# Patient Record
Sex: Male | Born: 1945
Health system: Southern US, Community
[De-identification: ages and names within clinical notes are randomized; demographics above are authoritative.]

## PROBLEM LIST (undated history)

## (undated) DIAGNOSIS — K429 Umbilical hernia without obstruction or gangrene: Secondary | ICD-10-CM

## (undated) DIAGNOSIS — M1990 Unspecified osteoarthritis, unspecified site: Secondary | ICD-10-CM

## (undated) DIAGNOSIS — N400 Enlarged prostate without lower urinary tract symptoms: Secondary | ICD-10-CM

## (undated) DIAGNOSIS — C801 Malignant (primary) neoplasm, unspecified: Secondary | ICD-10-CM

## (undated) DIAGNOSIS — N200 Calculus of kidney: Secondary | ICD-10-CM

## (undated) DIAGNOSIS — K5090 Crohn's disease, unspecified, without complications: Secondary | ICD-10-CM

## (undated) DIAGNOSIS — Z87442 Personal history of urinary calculi: Secondary | ICD-10-CM

## (undated) DIAGNOSIS — F32A Depression, unspecified: Secondary | ICD-10-CM

## (undated) DIAGNOSIS — I10 Essential (primary) hypertension: Secondary | ICD-10-CM

## (undated) DIAGNOSIS — E119 Type 2 diabetes mellitus without complications: Secondary | ICD-10-CM

## (undated) HISTORY — DX: Crohn's disease, unspecified, without complications: K50.90

## (undated) HISTORY — PX: HERNIA REPAIR: SHX51

## (undated) HISTORY — PX: CHOLECYSTECTOMY: SHX55

## (undated) HISTORY — DX: Unspecified osteoarthritis, unspecified site: M19.90

## (undated) HISTORY — PX: COLON SURGERY: SHX602

## (undated) HISTORY — DX: Depression, unspecified: F32.A

## (undated) HISTORY — DX: Benign prostatic hyperplasia without lower urinary tract symptoms: N40.0

## (undated) HISTORY — PX: OTHER SURGICAL HISTORY: SHX169

## (undated) HISTORY — DX: Essential (primary) hypertension: I10

## (undated) HISTORY — DX: Type 2 diabetes mellitus without complications: E11.9

## (undated) HISTORY — DX: Calculus of kidney: N20.0

## (undated) HISTORY — PX: CHOLECYSTECTOMY, LAPAROSCOPIC: SHX56

---

## 2006-11-08 ENCOUNTER — Inpatient Hospital Stay: Payer: Self-pay | Admitting: Vascular Surgery

## 2006-11-08 ENCOUNTER — Other Ambulatory Visit: Payer: Self-pay

## 2012-10-14 ENCOUNTER — Ambulatory Visit: Payer: Self-pay | Admitting: Nurse Practitioner

## 2013-07-06 ENCOUNTER — Ambulatory Visit: Payer: Self-pay | Admitting: Urology

## 2014-08-23 ENCOUNTER — Ambulatory Visit: Payer: Self-pay | Admitting: Urology

## 2014-08-30 ENCOUNTER — Ambulatory Visit: Payer: Self-pay | Admitting: Urology

## 2014-09-12 ENCOUNTER — Ambulatory Visit: Payer: Self-pay

## 2014-09-26 ENCOUNTER — Ambulatory Visit
Admit: 2014-09-26 | Disposition: A | Discharge status: Home / Self Care | Payer: Self-pay | Attending: Urology | Admitting: Urology

## 2014-10-09 ENCOUNTER — Ambulatory Visit: Payer: Self-pay

## 2014-10-11 ENCOUNTER — Ambulatory Visit
Admit: 2014-10-11 | Disposition: A | Discharge status: Home / Self Care | Payer: Self-pay | Attending: Urology | Admitting: Urology

## 2014-10-11 LAB — BASIC METABOLIC PANEL
Anion Gap: 8 (ref 7–16)
BUN: 21 mg/dL — ABNORMAL HIGH
CALCIUM: 8.9 mg/dL
CO2: 25 mmol/L
CREATININE: 0.94 mg/dL
Chloride: 106 mmol/L
EGFR (African American): >60
EGFR (Non-African Amer.): >60
GLUCOSE: 108 mg/dL — AB
Potassium: 4.2 mmol/L
Sodium: 139 mmol/L

## 2014-10-11 LAB — CBC
HCT: 43 % (ref 40.0–52.0)
HGB: 14.2 g/dL (ref 13.0–18.0)
MCH: 28.8 pg (ref 26.0–34.0)
MCHC: 33 g/dL (ref 32.0–36.0)
MCV: 87 fL (ref 80–100)
PLATELETS: 206 10*3/uL (ref 150–440)
RBC: 4.94 10*6/uL (ref 4.40–5.90)
RDW: 13.9 % (ref 11.5–14.5)
WBC: 8.2 10*3/uL (ref 3.8–10.6)

## 2014-10-13 LAB — URINE CULTURE

## 2014-10-15 NOTE — Op Note (Signed)
PATIENT NAME:  Isaiah Walker, Isaiah Walker MR#:  [id number] DATE OF BIRTH:  02-16-1946  DATE OF PROCEDURE:  08/30/2014  PREOPERATIVE DIAGNOSIS: Left kidney stones.   POSTOPERATIVE DIAGNOSIS: Left kidney stones.   PROCEDURE PERFORMED: Left percutaneous nephrolithotomy.   ANESTHESIA: General anesthesia.   ATTENDING SURGEON: Sherlynn Stalls, MD   ESTIMATED BLOOD LOSS: 150 mL.   DRAINS: A 16 French Foley catheter per urethra, 6 x 26 French double-J ureteral stent, an 45 French Council tip catheter nephrostomy tube.   SPECIMENS: Stone fragment.   COMPLICATIONS: None.   INDICATION: This is a 69 year old male with multiple large left kidney stones located in the lower pole as well as a large upper stone in the upper pole. Given his stone burden, he was counseled to undergo left percutaneous nephrolithotomy. He presented earlier today to interventional radiology, at which time lower pole posterior access was obtained and a nephroureteral catheter was placed down into the bladder. The patient presents for the definitive stone procedure this afternoon. The risks and benefits of the procedure were explained in detail. The patient agreed to proceed as planned.   PROCEDURE: The patient was correctly identified in the preoperative holding area and after consent was confirmed, he was brought to that suite and presented in supine position. At this time universal timeout protocol was performed. All team members were identified. Venodyne boots were placed and the patient was administered IV Ancef in the perioperative period. Of note, he had already received ciprofloxacin at the time of his percutaneous access in interventional radiology. He was then placed under general anesthesia while the remaining supine on the stretcher and a Foley catheter was placed sterilely at this time. The patient was then repositioned on the Operating Room table in the prone position, with care taken to pad all pressure points. He was strapped to  the table using 3 security straps. He was then prepped and draped in the standard surgical fashion. At this point in time, a 0.038 Sensor wire was placed through the no ureteral catheter down to the level of the bladder, confirmed under fluoroscopic guidance. The catheter was then removed, leaving only the wire in place. A safety wire introducer was then advanced to the level of the UPJ. I attempted to place a Super Stiff wire through this catheter down to the bladder. This did not go easily, but I was able to get a glide wire down to the bladder without difficulty. With 2 wires now in place, using a 5 [place] open-ended ureteral catheter down to the level of the mid ureter, the glide wire was exchanged for a Super Stiff wire, making the Sensor wire the safety wire and the Super Stiff wire the working wire. At this point in time, a larger incision was made in the flank, approximately 1.5 cm long. A fascial incising needle was used over the Super Stiff wire to incise the fascia and allow for easier access and dilation. A NephroMax balloon was then advanced just within the kidney under fluoroscopic guidance and the balloon was inflated up to 18 cm of water. The balloon inflated nicely without any weak points or wasted in the balloon. This remained in place for several minutes while the nephroscope and Cyber Wand were assembled. At this point, the 60 [place] access sheath was advanced over the balloon just within the lower pole calyx and the balloon was deflated. There is not much significant bleeding at this point and the balloon was removed. The nephroscope was then brought in and  almost immediately, the lower pole stones were encountered. There were numerous, very large stones, which could be seen directly visually, but also fluoroscopically. The Cyber Wand was then brought in and each of these stones were fragmented and suctioned out of the lower pole of the kidney. This went quite quickly and the stones broke quite  nicely. There was a few residual stones in another calyx, which were identified and these were plucked out using piranha graspers. The lower pole, at this point, was both visually and fluoroscopically clear of any significant stone fragment. I then attempted to advance the nephroscope up to the upper pole, but given the angulation, I was unable to work my scope safely to access the upper pole from this particular renal access. A flexible cystoscope was then brought in through the access sheath and I was able to navigate up to the upper pole where a very large single stone was identified. A 365 micron laser fiber was brought in and using the settings of 0.2 joules and 50 Hz, this stone was fragmented and dusted into very small pieces. Fluoroscopically at the end of the fragmentation, there was no residual large stone, but a haze of  dust could be appreciated. Unfortunately, visualization became somewhat poor at this point in time from some mucosal bleeding and I was unable to efficiently or effectively basket out any of the very small fragments in the upper pole. Several times throughout the case, the access sheath did back out of the patient's kidney and had to be re-advanced over the wire after reintroducing and dilating the tract, which is somewhat cumbersome. At this point, the stone fragmentation was deemed adequate. Given the small fragments in the upper pole and the stone dust, I did go ahead and elect to place an indwelling double-J stent to help facilitate the passage of these fragments. A 6 x 26 French double-J ureteral stent was advanced over the wire down to the level of the bladder under fluoroscopic guidance. The wire was partially withdrawn and the coil was noted to cross the midline and definitely appeared to be within the bladder. The wire was then fully withdrawn and unfortunately, at this point, the coil of the stent did not curl optimally within the renal pelvis. I then used the piranha forceps  and the nephroscope reposition the upper coil of the stent within the renal pelvis satisfactorily. Finally, the wire was reintroduced back into the renal pelvis. A nephroscope and an 41 [place] Council tip catheter was advanced over the wire into the renal pelvis and the balloon was then inflated with 3 mL to anchor the catheter, and the sheath was backed out over of the nephrostomy tube and removed. An antegrade nephrostogram was then performed through the Foley nephrostomy, which revealed minimal extravasation and good flow of contrast down the ureter. The safety wire was carefully removed with care taken not dislodge the stent. The nephrostomy tube was secured in place using silk suture x2. The patient was then cleaned and dried and a dressing of 4 x 4's, ABDs and foam tape was applied. At this point, the patient was repositioned in the supine position and back on the stretcher, reversed from anesthesia, and extubated. He was taken to the PACU in stable condition. There were no complications in this case.   PLAN: I will admit the patient overnight for observation and most likely remove his nephrostomy tube in the morning, as well as his Foley catheter if he is doing well.   He  will need to follow up in a few weeks with a KUB to see how much of the stone fragments he has been able to pass with aid of the ureteral stents. He may need a repeat noncontrast CT scan to assess his residual upper pole stone burden. I discussed with the family that based on these results, we may and elect to remove his stent versus return to the OR for basket extraction ureteroscopically at the upper pole fragments. They were all agreeable to this plan.   ____________________________ Sherlynn Stalls, MD ajb:ap D: 08/30/2014 19:51:34 ET T: 08/31/2014 08:14:37 ET JOB#: [id number]  cc: Sherlynn Stalls, MD, <Dictator> Sherlynn Stalls MD ELECTRONICALLY SIGNED 09/26/2014 17:51

## 2014-10-15 NOTE — Discharge Summary (Signed)
Dates of Admission and Diagnosis:  Date of Admission 30-Aug-2014   Date of Discharge 31-Aug-2014   Admitting Diagnosis Left kidney stones   Final Diagnosis Left kidney stones   Discharge Diagnosis 1 as above    Chief Complaint/History of Present Illness see admission H&P   Allergies:  IVP Dye: Rash    Routine Chem:  17-Mar-16 05:23   Glucose, Serum  135 (65-99 NOTE: New Reference Range  08/08/14)  BUN  23 (6-20 NOTE: New Reference Range  08/08/14)  Creatinine (comp) 0.98 (0.61-1.24 NOTE: New Reference Range  08/08/14)  Sodium, Serum 140 (135-145 NOTE: New Reference Range  08/08/14)  Potassium, Serum 4.4 (3.5-5.1 NOTE: New Reference Range  08/08/14)  Chloride, Serum 107 (101-111 NOTE: New Reference Range  08/08/14)  CO2, Serum 25 (22-32 NOTE: New Reference Range  08/08/14)  Calcium (Total), Serum  8.4 (8.9-10.3 NOTE: New Reference Range  08/08/14)  Anion Gap 8  eGFR (African American) >60  eGFR (Non-African American) >60 (eGFR values <82m/min/1.73 m2 may be an indication of chronic kidney disease (CKD). Calculated eGFR is useful in patients with stable renal function. The eGFR calculation will not be reliable in acutely ill patients when serum creatinine is changing rapidly. It is not useful in patients on dialysis. The eGFR calculation may not be applicable to patients at the low and high extremes of body sizes, pregnant women, and vegetarians.)  Routine Hem:  17-Mar-16 05:23   WBC (CBC)  19.5  RBC (CBC) 4.69  Hemoglobin (CBC) 13.3  Hematocrit (CBC) 41.2  Platelet Count (CBC) 181  MCV 88  MCH 28.3  MCHC 32.3  RDW 14.3  Neutrophil % 89.3  Lymphocyte % 4.2  Monocyte % 6.5  Eosinophil % 0.0  Basophil % 0.0  Neutrophil #  17.4  Lymphocyte #  0.8  Monocyte #  1.3  Eosinophil # 0.0  Basophil # 0.0 (Result(s) reported on 31 Aug 2014 at 06:27AM.)   Pertinent Past History:  Pertinent Past History See admission H&P   Hospital Course:  Hospital  Course Patient was admitted for observation following left PNCL.  He tolerated the procedure well and was taken to the PACU then floor in stable condition.  Overnight, his pain was well controlled, labs/ vitals remains stable.  POD1 his nephrostomy tube was clamped and ultimately his Foley and nephrostomy tube was removed.  His diet was advacned and he was able to tolerate a regular diet.  He was discharged home in good condition.   Condition on Discharge Good   Code Status:  Code Status Full Code   DISCHARGE INSTRUCTIONS HOME MEDS:  Medication Reconciliation: Patient's Home Medications at Discharge:     Medication Instructions  meloxicam 15 mg oral tablet  1 tab(s) orally once a day   allopurinol 300 mg oral tablet  1 tab(s) orally once a day   metoprolol tartrate 50 mg oral tablet  1 tab(s) orally 2 times a day   trazodone 150 mg oral tablet  1 tab(s) orally once a day (at bedtime)   acetaminophen-oxycodone 325 mg-5 mg oral tablet  1 tab(s) orally every 6 hours, As Needed - for Pain   flomax 0.4 mg oral capsule  1 cap(s) orally once a day   doculase 100 mg oral capsule  1 cap(s) orally     PRESCRIPTIONS: PRINTED AND PLACED ON CHART   Physician's Instructions:  Home Health? No   Treatments None    Dressing Care A dry bandage has been applied to your wound.  Keep this bandage clean and dry.  Remove dressing tomorrow.  May shower.  Remove dressing in 2-3 days.  May shower.  It is NORMAL to leak urine from  the back for several days.  Please change the dressing as needed.    Home Oxygen? No   Diet Regular    Dietary Supplements None   Diet Consistency Regular Consistency    Activity Limitations As tolerated    Return to Work as tolerated    Time frame for Follow Up Appointment 2-4 weeks  3 weeks with Dr. Erlene Quan, KUB prior        Hollice Espy J(Attending Physician): [facility], [address] [place], [place], [place] [address], [place]  [phone]  Electronic Signatures: Sherlynn Stalls (MD)  (Signed 17-Mar-16 11:03)  Authored: ADMISSION DATE AND DIAGNOSIS, CHIEF COMPLAINT/HPI, Allergies, PERTINENT LABS, PERTINENT PAST HISTORY, HOSPITAL COURSE, DISCHARGE INSTRUCTIONS HOME MEDS, PATIENT INSTRUCTIONS, Follow Up Physician   Last Updated: 17-Mar-16 11:03 by Sherlynn Stalls (MD)

## 2014-10-16 ENCOUNTER — Ambulatory Visit: Payer: Medicare Other | Admitting: Certified Registered"

## 2014-10-16 ENCOUNTER — Encounter: Payer: Self-pay | Admitting: *Deleted

## 2014-10-16 ENCOUNTER — Encounter
Admission: RE | Disposition: A | Discharge status: Home / Self Care | Payer: Self-pay | Source: Ambulatory Visit | Attending: Urology

## 2014-10-16 ENCOUNTER — Ambulatory Visit
Admission: RE | Admit: 2014-10-16 | Discharge: 2014-10-16 | Disposition: A | Discharge status: Home / Self Care | Payer: Medicare Other | Source: Ambulatory Visit | Attending: Urology | Admitting: Urology

## 2014-10-16 DIAGNOSIS — N400 Enlarged prostate without lower urinary tract symptoms: Secondary | ICD-10-CM | POA: Insufficient documentation

## 2014-10-16 DIAGNOSIS — N3001 Acute cystitis with hematuria: Secondary | ICD-10-CM | POA: Insufficient documentation

## 2014-10-16 DIAGNOSIS — R300 Dysuria: Secondary | ICD-10-CM | POA: Insufficient documentation

## 2014-10-16 DIAGNOSIS — N200 Calculus of kidney: Secondary | ICD-10-CM

## 2014-10-16 DIAGNOSIS — Z91041 Radiographic dye allergy status: Secondary | ICD-10-CM | POA: Diagnosis not present

## 2014-10-16 DIAGNOSIS — Z87442 Personal history of urinary calculi: Secondary | ICD-10-CM | POA: Diagnosis not present

## 2014-10-16 DIAGNOSIS — I10 Essential (primary) hypertension: Secondary | ICD-10-CM | POA: Diagnosis not present

## 2014-10-16 DIAGNOSIS — N202 Calculus of kidney with calculus of ureter: Secondary | ICD-10-CM | POA: Insufficient documentation

## 2014-10-16 DIAGNOSIS — Z801 Family history of malignant neoplasm of trachea, bronchus and lung: Secondary | ICD-10-CM | POA: Diagnosis not present

## 2014-10-16 DIAGNOSIS — Z833 Family history of diabetes mellitus: Secondary | ICD-10-CM | POA: Insufficient documentation

## 2014-10-16 DIAGNOSIS — Z79899 Other long term (current) drug therapy: Secondary | ICD-10-CM | POA: Diagnosis not present

## 2014-10-16 DIAGNOSIS — Z6836 Body mass index (BMI) 36.0-36.9, adult: Secondary | ICD-10-CM | POA: Diagnosis not present

## 2014-10-16 DIAGNOSIS — Z8249 Family history of ischemic heart disease and other diseases of the circulatory system: Secondary | ICD-10-CM | POA: Insufficient documentation

## 2014-10-16 DIAGNOSIS — E669 Obesity, unspecified: Secondary | ICD-10-CM | POA: Diagnosis not present

## 2014-10-16 DIAGNOSIS — E785 Hyperlipidemia, unspecified: Secondary | ICD-10-CM | POA: Diagnosis not present

## 2014-10-16 HISTORY — PX: CYSTOSCOPY/URETEROSCOPY/HOLMIUM LASER/STENT PLACEMENT: SHX6546

## 2014-10-16 SURGERY — CYSTOSCOPY/URETEROSCOPY/HOLMIUM LASER/STENT PLACEMENT
Anesthesia: General | Laterality: Left | Wound class: Clean Contaminated

## 2014-10-16 MED ORDER — KETOROLAC TROMETHAMINE 60 MG/2ML IM SOLN
INTRAMUSCULAR | Status: AC
  Administered 2014-10-16: 15 mg via INTRAVENOUS
  Filled 2014-10-16: qty 2

## 2014-10-16 MED ORDER — FENTANYL CITRATE (PF) 100 MCG/2ML IJ SOLN
25.0000 ug | INTRAMUSCULAR | Status: DC | PRN
  Administered 2014-10-16 (×2): 25 ug via INTRAVENOUS

## 2014-10-16 MED ORDER — FENTANYL CITRATE (PF) 100 MCG/2ML IJ SOLN
INTRAMUSCULAR | Status: DC | PRN
  Administered 2014-10-16 (×2): 50 ug via INTRAVENOUS

## 2014-10-16 MED ORDER — ROCURONIUM BROMIDE 100 MG/10ML IV SOLN
INTRAVENOUS | Status: DC | PRN
  Administered 2014-10-16: 30 mg via INTRAVENOUS

## 2014-10-16 MED ORDER — FENTANYL CITRATE (PF) 100 MCG/2ML IJ SOLN
INTRAMUSCULAR | Status: AC
  Filled 2014-10-16: qty 2

## 2014-10-16 MED ORDER — FAMOTIDINE 20 MG PO TABS
ORAL_TABLET | ORAL | Status: AC
  Filled 2014-10-16: qty 1

## 2014-10-16 MED ORDER — CEFAZOLIN SODIUM 1-5 GM-% IV SOLN
1.0000 g | Freq: Once | INTRAVENOUS | Status: AC
  Administered 2014-10-16: 1 g via INTRAVENOUS

## 2014-10-16 MED ORDER — TAMSULOSIN HCL 0.4 MG PO CAPS: 0.4000 mg | ORAL_CAPSULE | Freq: Every day | ORAL | Status: DC

## 2014-10-16 MED ORDER — PROPOFOL 10 MG/ML IV BOLUS
INTRAVENOUS | Status: DC | PRN
  Administered 2014-10-16: 170 mg via INTRAVENOUS

## 2014-10-16 MED ORDER — LACTATED RINGERS IV SOLN
INTRAVENOUS | Status: DC
Start: 2014-10-16 — End: 2014-10-16
  Administered 2014-10-16: 15:00:00 via INTRAVENOUS

## 2014-10-16 MED ORDER — PHENYLEPHRINE HCL 10 MG/ML IJ SOLN
INTRAMUSCULAR | Status: DC | PRN
  Administered 2014-10-16: 100 ug via INTRAVENOUS

## 2014-10-16 MED ORDER — ONDANSETRON HCL 4 MG/2ML IJ SOLN: 4.0000 mg | Freq: Once | INTRAMUSCULAR | Status: DC | PRN

## 2014-10-16 MED ORDER — IOTHALAMATE MEGLUMINE 43 % IV SOLN
INTRAVENOUS | Status: DC | PRN
  Administered 2014-10-16: 15 mL via URETHRAL

## 2014-10-16 MED ORDER — MIDAZOLAM HCL 2 MG/2ML IJ SOLN
INTRAMUSCULAR | Status: DC | PRN
  Administered 2014-10-16: 2 mg via INTRAVENOUS

## 2014-10-16 MED ORDER — EPHEDRINE SULFATE 50 MG/ML IJ SOLN
INTRAMUSCULAR | Status: DC | PRN
  Administered 2014-10-16 (×2): 5 mg via INTRAVENOUS

## 2014-10-16 MED ORDER — ONDANSETRON HCL 4 MG/2ML IJ SOLN
INTRAMUSCULAR | Status: DC | PRN
  Administered 2014-10-16: 4 mg via INTRAVENOUS

## 2014-10-16 MED ORDER — GLYCOPYRROLATE 0.2 MG/ML IJ SOLN
INTRAMUSCULAR | Status: DC | PRN
  Administered 2014-10-16: 0.6 mg via INTRAVENOUS

## 2014-10-16 MED ORDER — OXYCODONE-ACETAMINOPHEN 5-325 MG PO TABS: 1.0000 | ORAL_TABLET | Freq: Four times a day (QID) | ORAL | Status: DC | PRN

## 2014-10-16 MED ORDER — LACTATED RINGERS IV SOLN
INTRAVENOUS | Status: DC | PRN
  Administered 2014-10-16: 16:00:00 via INTRAVENOUS

## 2014-10-16 MED ORDER — KETOROLAC TROMETHAMINE 15 MG/ML IJ SOLN
15.0000 mg | Freq: Once | INTRAMUSCULAR | Status: DC
  Filled 2014-10-16: qty 1

## 2014-10-16 MED ORDER — LIDOCAINE HCL (CARDIAC) 20 MG/ML IV SOLN
INTRAVENOUS | Status: DC | PRN
  Administered 2014-10-16: 100 mg via INTRAVENOUS

## 2014-10-16 MED ORDER — NEOSTIGMINE METHYLSULFATE 10 MG/10ML IV SOLN
INTRAVENOUS | Status: DC | PRN
  Administered 2014-10-16: 4 mg via INTRAVENOUS

## 2014-10-16 MED ORDER — SODIUM CHLORIDE 0.9 % IR SOLN
Status: DC | PRN
  Administered 2014-10-16: 1500 mL

## 2014-10-16 MED ORDER — OXYBUTYNIN CHLORIDE 5 MG PO TABS: 5.0000 mg | ORAL_TABLET | Freq: Three times a day (TID) | ORAL | Status: DC | PRN

## 2014-10-16 MED ORDER — FAMOTIDINE 20 MG PO TABS
20.0000 mg | ORAL_TABLET | Freq: Once | ORAL | Status: AC
  Administered 2014-10-16: 20 mg via ORAL

## 2014-10-16 MED ORDER — CEFAZOLIN SODIUM 1-5 GM-% IV SOLN
INTRAVENOUS | Status: AC
  Filled 2014-10-16: qty 50

## 2014-10-16 SURGICAL SUPPLY — 25 items
ADAPTER SCOPE UROLOK II (MISCELLANEOUS) ×3 IMPLANT
ADHESIVE MASTISOL STRL (MISCELLANEOUS) IMPLANT
BAG DRAIN CYSTO-URO LG1000N (MISCELLANEOUS) ×3 IMPLANT
CATH URETL 5X70 OPEN END (CATHETERS) ×3 IMPLANT
CONRAY 43 FOR UROLOGY 50M (MISCELLANEOUS) ×3 IMPLANT
DRSG TEGADERM 2-3/8X2-3/4 SM (GAUZE/BANDAGES/DRESSINGS) IMPLANT
GLOVE BIO SURGEON STRL SZ 6.5 (GLOVE) ×2 IMPLANT
GLOVE BIO SURGEON STRL SZ7 (GLOVE) ×9 IMPLANT
GLOVE BIO SURGEONS STRL SZ 6.5 (GLOVE) ×1
GOWN STRL REUS W/ TWL LRG LVL3 (GOWN DISPOSABLE) ×2 IMPLANT
GOWN STRL REUS W/TWL LRG LVL3 (GOWN DISPOSABLE) ×4
GUIDEWIRE SUPER STIFF (WIRE) ×3 IMPLANT
JELLY LUB 2OZ STRL (MISCELLANEOUS) ×2
JELLY LUBE 2OZ STRL (MISCELLANEOUS) ×1 IMPLANT
PACK CYSTO AR (MISCELLANEOUS) ×3 IMPLANT
PREP PVP WINGED SPONGE (MISCELLANEOUS) ×3 IMPLANT
SENSORWIRE 0.038 NOT ANGLED (WIRE) ×3
SET CYSTO W/LG BORE CLAMP LF (SET/KITS/TRAYS/PACK) ×3 IMPLANT
SOL .9 NS 3000ML IRR  AL (IV SOLUTION) ×2
SOL .9 NS 3000ML IRR UROMATIC (IV SOLUTION) ×1 IMPLANT
STENT URET 6FRX24 CONTOUR (STENTS) IMPLANT
STENT URET 6FRX26 CONTOUR (STENTS) ×3 IMPLANT
SYRINGE IRR TOOMEY STRL 70CC (SYRINGE) ×3 IMPLANT
WATER STERILE IRR 1000ML POUR (IV SOLUTION) ×3 IMPLANT
WIRE SENSOR 0.038 NOT ANGLED (WIRE) ×1 IMPLANT

## 2014-10-16 NOTE — Anesthesia Procedure Notes (Signed)
Procedure Name: Intubation Date/Time: 10/16/2014 4:00 PM Performed by: Silvana Newness Pre-anesthesia Checklist: Patient identified, Emergency Drugs available, Suction available, Patient being monitored and Timeout performed Patient Re-evaluated:Patient Re-evaluated prior to inductionOxygen Delivery Method: Circle system utilized Preoxygenation: Pre-oxygenation with 100% oxygen Intubation Type: IV induction Ventilation: Mask ventilation without difficulty Laryngoscope Size: Mac and 4 Grade View: Grade II Tube type: Oral Tube size: 7.0 mm Number of attempts: 1 Airway Equipment and Method: Stylet Placement Confirmation: ETT inserted through vocal cords under direct vision,  positive ETCO2 and breath sounds checked- equal and bilateral Secured at: 20 cm Tube secured with: Tape Dental Injury: Teeth and Oropharynx as per pre-operative assessment

## 2014-10-16 NOTE — H&P (Signed)
Patient seen and examined.  H&P up to date.  Plan for Left ureteroscopy, laser lithotripsy, left ureteral stent exchange.  RRR CTAB  Aixa Corsello  3:18 PM 10/16/2014

## 2014-10-16 NOTE — Anesthesia Postprocedure Evaluation (Signed)
  Anesthesia Post-op Note  Patient: Isaiah Walker  Procedure(s) Performed: Procedure(s): CYSTOSCOPY/URETEROSCOPY/HOLMIUM LASER/STENT PLACEMENT (Left)  Anesthesia type:General  Patient location: PACU  Post pain: Pain level controlled  Post assessment: Post-op Vital signs reviewed, Patient's Cardiovascular Status Stable, Respiratory Function Stable, Patent Airway and No signs of Nausea or vomiting  Post vital signs: Reviewed and stable  Last Vitals:  Filed Vitals:   10/16/14 1730  BP: 137/79  Pulse: 87  Temp: 37.3 C  Resp: 21    Level of consciousness: awake, alert  and patient cooperative  Complications: No apparent anesthesia complications

## 2014-10-16 NOTE — Transfer of Care (Signed)
Immediate Anesthesia Transfer of Care Note  Patient: Isaiah Walker  Procedure(s) Performed: Procedure(s): CYSTOSCOPY/URETEROSCOPY/HOLMIUM LASER/STENT PLACEMENT (Left)  Patient Location: PACU  Anesthesia Type:General  Level of Consciousness: awake, alert , oriented and patient cooperative  Airway & Oxygen Therapy: Patient Spontanous Breathing and Patient connected to face mask oxygen  Post-op Assessment: Report given to RN, Post -op Vital signs reviewed and stable and Patient moving all extremities  Post vital signs: Reviewed and stable  Last Vitals:  Filed Vitals:   10/16/14 1730  BP: 137/79  Pulse: 87  Temp: 37.3 C  Resp: 21    Complications: No apparent anesthesia complications

## 2014-10-16 NOTE — Discharge Instructions (Signed)
You have a ureteral stent in place.  This is a tube that extends from your kidney to your bladder.  This may cause urinary bleeding, burning with urination, and urinary frequency.  Please call our office or present to the ED if you develop fevers >101 or pain which is not able to be controlled with oral pain medications.  You may be given either Flomax and/ or ditropan to help with bladder spasms and stent pain in addition to pain medications.   ° °[facility] °[address], [address] °[place], [place] [address] °[phone] °

## 2014-10-16 NOTE — Brief Op Note (Signed)
10/16/2014  5:32 PM  PATIENT:  Berline Chough  69 y.o. male  PRE-OPERATIVE DIAGNOSIS:  RENAL STONES  POST-OPERATIVE DIAGNOSIS:  Left renal/ ureteral stones  PROCEDURE:  Procedure(s): CYSTOSCOPY/URETEROSCOPY/HOLMIUM LASER/STENT PLACEMENT (Left)  SURGEON:  Surgeon(s) and Role:    * Hollice Espy, MD - Primary  ANESTHESIA:   general  EBL:   minimal  DRAINS: 6 x 26 Fr JJ ureteral stent (NO string)   SPECIMEN:  No Specimen  DISPOSITION OF SPECIMEN:  N/A  COUNTS:  YES  TOURNIQUET:  * No tourniquets in log *  PLAN OF CARE: Discharge to home after PACU  PATIENT DISPOSITION:  PACU - hemodynamically stable.   Delay start of Pharmacological VTE agent (>24hrs) due to surgical blood loss or risk of bleeding: not applicable

## 2014-10-16 NOTE — Anesthesia Preprocedure Evaluation (Addendum)
Anesthesia Evaluation  Patient identified by MRN, date of birth, ID band Patient awake    Reviewed: Allergy & Precautions, NPO status , Patient's Chart, lab work & pertinent test results  History of Anesthesia Complications Negative for: history of anesthetic complications  Airway Mallampati: II  TM Distance: >3 FB Neck ROM: Full    Dental  (+) Partial Lower, Partial Upper   Pulmonary neg pulmonary ROS,    Pulmonary exam normal       Cardiovascular Exercise Tolerance: Good hypertension, Pt. on home beta blockers     Neuro/Psych negative neurological ROS  negative psych ROS   GI/Hepatic negative GI ROS, Neg liver ROS,   Endo/Other  negative endocrine ROSHypothyroidism   Renal/GU Renal InsufficiencyRenal disease     Musculoskeletal  (+) Arthritis -, Osteoarthritis,    Abdominal Normal abdominal exam  (+)   Peds  Hematology negative hematology ROS (+)   Anesthesia Other Findings   Reproductive/Obstetrics                          Anesthesia Physical Anesthesia Plan  ASA: III  Anesthesia Plan: General   Post-op Pain Management:    Induction: Intravenous  Airway Management Planned: LMA and Oral ETT  Additional Equipment:   Intra-op Plan:   Post-operative Plan: Extubation in OR  Informed Consent: I have reviewed the patients History and Physical, chart, labs and discussed the procedure including the risks, benefits and alternatives for the proposed anesthesia with the patient or authorized representative who has indicated his/her understanding and acceptance.   Dental advisory given  Plan Discussed with: Surgeon and CRNA  Anesthesia Plan Comments:         Anesthesia Quick Evaluation

## 2014-10-19 NOTE — Op Note (Signed)
[name], BISS NO.:  [id number]  MEDICAL RECORD NO.:  [id number]  LOCATION:  [facility]                         FACILITY:  [facility]  PHYSICIAN:  Isaiah Espy, MD     DATE OF BIRTH:  February 27, 1946  DATE OF PROCEDURE:  10/16/2014 DATE OF DISCHARGE:  10/16/2014                              OPERATIVE REPORT   PREOPERATIVE DIAGNOSIS:  Left kidney and ureteral stones.  POSTOPERATIVE DIAGNOSIS:  Left kidney and ureteral stones.  PROCEDURE PERFORMED:  Left ureteroscopy, laser lithotripsy, left ureteral stent exchange, left retrograde pyelogram.  SURGEON:  Isaiah Espy, MD  ANESTHESIA:  General anesthesia.  EBL:  Minimal.  DRAINS:  6 x 20 6-French double-J ureteral stent.  COMPLICATIONS:  None.  SPECIMENS:  None.  INDICATION:  This is a 69 year old male with a very large stone burden, who previously underwent PCNL for treatment of the majority of his stone burden.  He did have a few small residual stones at the end of the case which were unable to be retrieved at the time of the procedure.  In anticipation of returning for ureteroscopy to clear him of residual stone, I did place a double-J ureteral stent at the end of the case.  He returns today for ureteroscopy after a followup CT scan shows an 8 mm stone in the left lower pole of the kidney as well as some stone debris along the distal aspect of the stent.  He was counseled on his various treatment options and has elected to proceed today as planned procedure.  DESCRIPTION OF PROCEDURE:  The patient was correctly identified in the preoperative holding area and informed consent was confirmed.  He was brought to the operating suite and placed on the table in the supine position.  At this time, universal time-out protocol was performed.  All team members were identified.  Venodyne boots were placed, and he was administered IV ampicillin and gentamicin in the perioperative period. He was then placed under  general anesthesia, repositioned lower on the bed in the dorsal lithotomy position, prepped and draped in standard surgical fashion.  At this point in time, a rigid cystoscope using a 61- French access sheath was advanced per urethra into the bladder.  The ureteral stent was seen emanating from the right ureteral orifice, which was mildly encrusted.  The distal coil of the stent was grasped using stent graspers and brought out to the level of the urethral meatus.  The stent was then cannulated using a Sensor wire up to the level of the kidney, and the stent was removed.  This was snapped in placed as a safety wire.  A long semi-rigid ureteroscope was then brought in and advanced through the UO without difficulty up to the level of the mid ureter, where a larger stone fragment was identified.  A 200 micron laser fiber was then brought in and used on settings of 0.8 joules and 10 Hz.  The stone was fragmented into a few larger pieces.  Each of these pieces were then basketed out using a 1.9-French Nitinol basket until the distal end and mid ureter were noted to be clear of any stone or stone  fragments.  There was a small amount of edema in the mid ureter where the stone had been located, but no mucosal injury or trauma.  I was not able to advance the scope past the distal ureter to assess the remainder of the ureter, although it appeared to be free of any stone burden both on CT scan and fluoroscopy.  A second wire was then introduced up to the level of the kidney using the rigid cystoscope, and a flexible ureteroscope was then advanced over this working wire up to the level of the kidney.  The kidney was then carefully inspected and the larger 8 mm stone was identified in the lower pole calix.  The kidney had healed remarkably well from his PCNL procedure, and there was intact urothelium noted in all calices.  This stone was then fragmented into very fine dust-like particles using dusting  stone settings of 0.2 joules and 50 Hz.  Each and every calix were then directly visualized and there was no significant residual stone burden noted.  The scope was backed to the level of the UPJ and a retrograde pyelogram was performed. This revealed a mildly distended renal pelvis without any obvious filling defects or extravasation.  Each and every calix was then directly visualized using the previously performed pyelogram as a road map.  The kidney at this point was noted to be free of any significant stone fragments.  The scope was then backed down the entire length of the ureter, directly visualizing it along the way.  There were no residual fragments along the length of the ureter.  The Sensor wire was then back loaded over a rigid cystoscope and a 6 x 26  French double-J ureteral stent was advanced over the wire to the level of the kidney. The wire was then partially withdrawn and a coil was noted within the renal pelvis.  The wire was then fully withdrawn and a coil was noted within the bladder.  Bladder was then drained.  The patient was then repositioned in the supine position, reversed from anesthesia and taken to the PACU in stable condition.  There were no complications in this case.          ______________________________ Isaiah Espy, MD     AB/MEDQ  D:  10/18/2014  T:  10/19/2014  Job:  [id number]

## 2014-10-23 ENCOUNTER — Encounter: Payer: Self-pay | Admitting: Urology

## 2014-11-14 ENCOUNTER — Other Ambulatory Visit: Payer: Self-pay | Admitting: Urology

## 2014-11-14 DIAGNOSIS — N200 Calculus of kidney: Secondary | ICD-10-CM

## 2014-11-21 ENCOUNTER — Other Ambulatory Visit: Payer: Self-pay

## 2014-11-21 ENCOUNTER — Ambulatory Visit: Payer: Medicare Other

## 2014-11-21 DIAGNOSIS — N200 Calculus of kidney: Secondary | ICD-10-CM

## 2014-12-01 ENCOUNTER — Ambulatory Visit
Admission: RE | Admit: 2014-12-01 | Discharge: 2014-12-01 | Disposition: A | Discharge status: Home / Self Care | Payer: Medicare Other | Source: Ambulatory Visit | Attending: Urology | Admitting: Urology

## 2014-12-01 DIAGNOSIS — N200 Calculus of kidney: Secondary | ICD-10-CM | POA: Insufficient documentation

## 2014-12-20 ENCOUNTER — Telehealth: Payer: Self-pay | Admitting: Urology

## 2014-12-20 NOTE — Telephone Encounter (Signed)
Patient had a renal ultrasound on 6/17 and is calling to obtain results.  Does he need to come into the office to dicuss?

## 2015-02-25 ENCOUNTER — Other Ambulatory Visit: Payer: Self-pay | Admitting: Urology

## 2015-03-07 NOTE — Telephone Encounter (Signed)
It does not look like patient ever received his RUS results or a follow up appointment.  He still has stones.  He needs an appointment with either me Isaiah Walker) or Ria Comment.

## 2015-04-20 ENCOUNTER — Other Ambulatory Visit: Payer: Self-pay

## 2015-04-20 DIAGNOSIS — N400 Enlarged prostate without lower urinary tract symptoms: Secondary | ICD-10-CM

## 2015-04-20 MED ORDER — FINASTERIDE 5 MG PO TABS: 5.0000 mg | ORAL_TABLET | Freq: Every day | ORAL | Status: DC

## 2015-05-02 ENCOUNTER — Telehealth: Payer: Self-pay

## 2015-05-02 NOTE — Telephone Encounter (Signed)
This is still in the messages. Does it still need to be addressed? i never saw where he came in for results nor did i see where he has called back?  Sharyn Lull

## 2016-07-08 ENCOUNTER — Encounter: Payer: Self-pay | Admitting: Urology

## 2016-07-08 ENCOUNTER — Ambulatory Visit: Payer: Medicare Other | Admitting: Urology

## 2016-07-08 VITALS — BP 150/71 | HR 64 | Ht 65.0 in | Wt 254.0 lb

## 2016-07-08 DIAGNOSIS — R350 Frequency of micturition: Secondary | ICD-10-CM | POA: Diagnosis not present

## 2016-07-08 DIAGNOSIS — N200 Calculus of kidney: Secondary | ICD-10-CM

## 2016-07-08 DIAGNOSIS — N401 Enlarged prostate with lower urinary tract symptoms: Secondary | ICD-10-CM | POA: Diagnosis not present

## 2016-07-08 DIAGNOSIS — N138 Other obstructive and reflux uropathy: Secondary | ICD-10-CM | POA: Diagnosis not present

## 2016-07-08 LAB — BLADDER SCAN AMB NON-IMAGING

## 2016-07-08 NOTE — Progress Notes (Signed)
07/08/2016 11:44 AM   Isaiah Walker Oct 30, 1945 [id number]  Referring provider: [name], MD [address] [place], [place] [address]  Chief Complaint  Patient presents with  . Urinary Frequency    HPI: New pt for frequency. He has daytime frequency. Noc x 0-1. IPSS = 6 (intermittent, frequency, nocturia x 1) with mixed QOL. PVR = 0 ml. No dysuria or gross hematuria. He has a good flow. He has some urgency and can start before he gets there. Lifting heavy objects, running water and turn key are triggers. He has a h/o BPH on  Finasteride. He reports a prostate biopsy. He had a PSA with Dr. [name] in the fall and was told it was normal. He doesn't drink a lot of fluids.   He has a h/o left URS following a PCNL 10/2014 when a stent was placed. His recalls the stent was removed here in the office. The note may be in the old EMR. He was "scanned" about 6 months ago and told the kidney stones were back.    PMH: Past Medical History:  Diagnosis Date  . Arthritis   . BPH (benign prostatic hyperplasia)   . Crohn's disease ([place])   . Hypertension   . Kidney stones     Surgical History: Past Surgical History:  Procedure Laterality Date  . CHOLECYSTECTOMY    . CHOLECYSTECTOMY, LAPAROSCOPIC N/A   . COLON SURGERY     Colectomy 2005  . CYSTOSCOPY/URETEROSCOPY/HOLMIUM LASER/STENT PLACEMENT Left 10/16/2014   Procedure: CYSTOSCOPY/URETEROSCOPY/HOLMIUM LASER/STENT PLACEMENT;  Surgeon: Hollice Espy, MD;  Location: [facility] ORS;  Service: Urology;  Laterality: Left;  . HERNIA REPAIR    . Percutaneous Left    Nephrolithotomy    Home Medications:  Allergies as of 07/08/2016      Reactions   Ivp Dye [iodinated Diagnostic Agents] Rash      Medication List       Accurate as of 07/08/16 11:44 AM. Always use your most recent med list.          allopurinol 300 MG tablet Commonly known as:  ZYLOPRIM Take 300 mg by mouth daily.   finasteride 5 MG tablet Commonly known as:  PROSCAR Take  1 tablet (5 mg total) by mouth daily.   meloxicam 15 MG tablet Commonly known as:  MOBIC Take 15 mg by mouth 2 (two) times daily.   metoprolol 50 MG tablet Commonly known as:  LOPRESSOR Take 50 mg by mouth 2 (two) times daily.   traZODone 150 MG tablet Commonly known as:  DESYREL Take 150 mg by mouth at bedtime.       Allergies:  Allergies  Allergen Reactions  . Ivp Dye [Iodinated Diagnostic Agents] Rash    Family History: No family history on file.  Social History:  reports that he has never smoked. He has never used smokeless tobacco. He reports that he does not drink alcohol or use drugs.  ROS: UROLOGY Frequent Urination?: Yes Hard to postpone urination?: No Burning/pain with urination?: No Get up at night to urinate?: No Leakage of urine?: No Urine stream starts and stops?: Yes Trouble starting stream?: No Do you have to strain to urinate?: No Blood in urine?: No Urinary tract infection?: No Sexually transmitted disease?: No Injury to kidneys or bladder?: No Painful intercourse?: No Weak stream?: No Erection problems?: No Penile pain?: No  Gastrointestinal Nausea?: No Vomiting?: No Indigestion/heartburn?: No Diarrhea?: No Constipation?: No  Constitutional Fever: No Night sweats?: No Weight loss?: No Fatigue?: No  Skin Skin rash/lesions?: No Itching?: No  Eyes Blurred vision?: No Double vision?: No  Ears/Nose/Throat Sore throat?: No Sinus problems?: No  Hematologic/Lymphatic Swollen glands?: No Easy bruising?: No  Cardiovascular Leg swelling?: No Chest pain?: No  Respiratory Cough?: No Shortness of breath?: No  Endocrine Excessive thirst?: No  Musculoskeletal Back pain?: No Joint pain?: No  Neurological Headaches?: No Dizziness?: No  Psychologic Depression?: No Anxiety?: No  Physical Exam: BP (!) 150/71   Pulse 64   Ht 5\' 5"  (1.651 m)   Wt 115.2 kg (254 lb)   BMI 42.27 kg/m   Constitutional:  Alert and  oriented, No acute distress. HEENT: [place] AT, moist mucus membranes.  Trachea midline, no masses. Cardiovascular: No clubbing, cyanosis, or edema. Respiratory: Normal respiratory effort, no increased work of breathing. GI: Abdomen is soft, nontender, nondistended, no abdominal masses GU: No CVA tenderness. Skin: No rashes, bruises or suspicious lesions. Lymph: No cervical or inguinal adenopathy. Neurologic: Grossly intact, no focal deficits, moving all 4 extremities. Psychiatric: Normal mood and affect. DRE: prostate 30 g, smooth, no hard area or nodule.   Laboratory Data: Lab Results  Component Value Date   WBC 8.2 10/11/2014   HGB 14.2 10/11/2014   HCT 43.0 10/11/2014   MCV 87 10/11/2014   PLT 206 10/11/2014    Lab Results  Component Value Date   CREATININE 0.94 10/11/2014    No results found for: PSA  No results found for: TESTOSTERONE  No results found for: HGBA1C  Urinalysis No results found for: COLORURINE, APPEARANCEUR, LABSPEC, PHURINE, GLUCOSEU, HGBUR, BILIRUBINUR, KETONESUR, PROTEINUR, UROBILINOGEN, NITRITE, LEUKOCYTESUR  Pertinent Imaging:   Assessment & Plan:    1. Urinary frequency -- Discussed to increase fluid intake. Trial of Vesicare -- discussed nature r/b of antimuscarinics.  - BLADDER SCAN AMB NON-IMAGING  2. BPH - stable on finasteride. Get PSA sent over.   3. Kidney stones - check KUB for stone burden possible ureteral stone.     No Follow-up on file.  Festus Aloe, [facility] [address] [place], [place] [address] [phone]

## 2016-07-09 ENCOUNTER — Other Ambulatory Visit: Payer: Self-pay

## 2016-07-25 ENCOUNTER — Ambulatory Visit: Payer: Medicare Other

## 2016-07-31 ENCOUNTER — Ambulatory Visit: Payer: Medicare Other | Admitting: Urology

## 2016-07-31 ENCOUNTER — Encounter: Payer: Self-pay | Admitting: Urology

## 2016-07-31 VITALS — BP 170/76 | HR 61 | Ht 65.0 in | Wt 260.9 lb

## 2016-07-31 DIAGNOSIS — R972 Elevated prostate specific antigen [PSA]: Secondary | ICD-10-CM

## 2016-07-31 DIAGNOSIS — R350 Frequency of micturition: Secondary | ICD-10-CM | POA: Diagnosis not present

## 2016-07-31 DIAGNOSIS — N200 Calculus of kidney: Secondary | ICD-10-CM

## 2016-07-31 DIAGNOSIS — N400 Enlarged prostate without lower urinary tract symptoms: Secondary | ICD-10-CM

## 2016-07-31 DIAGNOSIS — Z125 Encounter for screening for malignant neoplasm of prostate: Secondary | ICD-10-CM

## 2016-07-31 MED ORDER — SOLIFENACIN SUCCINATE 5 MG PO TABS: 5.0000 mg | ORAL_TABLET | Freq: Every day | ORAL | 11 refills | Status: DC

## 2016-07-31 NOTE — Progress Notes (Signed)
07/31/2016 11:41 AM   Isaiah Walker 03/19/1946 [id number]  Referring provider: [name], MD [address] [place], [place] [address]  Chief Complaint  Patient presents with  . Urinary Frequency    HPI: The patient is a 71 year old gentleman who presents today for follow-up.  1.  Urinary frequency The patient was started on Vesicare for this at his last visit. He was also encouraged to decrease his fluid intake. This medication has worked very well for him. He was originally urinating 3-4 times an hour. It is much improved and he is very happy with the results of this medication. He has minimal dry mouth. He notes no constipation symptoms as he has Crohn's and has problems with diarrhea.  2. BPH The patient is on finasteride with no current obstructive symptoms.  3. Elevated PSA Patient's last PSA was in October 2017. He was 2.3 but since he is on finasteride, his adjusted PSA is 4.6. He had a negative prostate biopsy in June 2015. His PSA at that time was 4.2. DRE was 30 g and benign in January 2018.  4. Nephrolithiasis He has a h/o left URS following a PCNL 10/2014 when a stent was placed. His recalls the stent was removed here in the office. The note may be in the old EMR. He was "scanned" about 6 months ago and told the kidney stones were back. No current symptoms  PMH: Past Medical History:  Diagnosis Date  . Arthritis   . BPH (benign prostatic hyperplasia)   . Crohn's disease ([place])   . Hypertension   . Kidney stones     Surgical History: Past Surgical History:  Procedure Laterality Date  . CHOLECYSTECTOMY    . CHOLECYSTECTOMY, LAPAROSCOPIC N/A   . COLON SURGERY     Colectomy 2005  . CYSTOSCOPY/URETEROSCOPY/HOLMIUM LASER/STENT PLACEMENT Left 10/16/2014   Procedure: CYSTOSCOPY/URETEROSCOPY/HOLMIUM LASER/STENT PLACEMENT;  Surgeon: Hollice Espy, MD;  Location: [facility] ORS;  Service: Urology;  Laterality: Left;  . HERNIA REPAIR    . Percutaneous Left    Nephrolithotomy    Home Medications:  Allergies as of 07/31/2016      Reactions   Ivp Dye [iodinated Diagnostic Agents] Rash      Medication List       Accurate as of 07/31/16 11:41 AM. Always use your most recent med list.          allopurinol 300 MG tablet Commonly known as:  ZYLOPRIM Take 300 mg by mouth daily.   finasteride 5 MG tablet Commonly known as:  PROSCAR Take 1 tablet (5 mg total) by mouth daily.   meloxicam 15 MG tablet Commonly known as:  MOBIC Take 15 mg by mouth 2 (two) times daily.   metoprolol 50 MG tablet Commonly known as:  LOPRESSOR Take 50 mg by mouth 2 (two) times daily.   solifenacin 5 MG tablet Commonly known as:  VESICARE Take 1 tablet (5 mg total) by mouth daily.   traZODone 150 MG tablet Commonly known as:  DESYREL Take 150 mg by mouth at bedtime.       Allergies:  Allergies  Allergen Reactions  . Ivp Dye [Iodinated Diagnostic Agents] Rash    Family History: No family history on file.  Social History:  reports that he has never smoked. He has never used smokeless tobacco. He reports that he does not drink alcohol or use drugs.  ROS: UROLOGY Frequent Urination?: No Hard to postpone urination?: No Burning/pain with urination?: No Get up at night to  urinate?: No Leakage of urine?: No Urine stream starts and stops?: No Trouble starting stream?: No Do you have to strain to urinate?: No Blood in urine?: No Urinary tract infection?: No Sexually transmitted disease?: No Injury to kidneys or bladder?: No Painful intercourse?: No Weak stream?: No Erection problems?: No Penile pain?: No  Gastrointestinal Nausea?: No Vomiting?: No Indigestion/heartburn?: No Diarrhea?: No Constipation?: No  Constitutional Fever: No Night sweats?: No Weight loss?: No Fatigue?: No  Skin Skin rash/lesions?: No Itching?: No  Eyes Blurred vision?: No Double vision?: No  Ears/Nose/Throat Sore throat?: No Sinus problems?:  No  Hematologic/Lymphatic Swollen glands?: No Easy bruising?: No  Cardiovascular Leg swelling?: No Chest pain?: No  Respiratory Cough?: No Shortness of breath?: No  Endocrine Excessive thirst?: No  Musculoskeletal Back pain?: No Joint pain?: No  Neurological Headaches?: No Dizziness?: No  Psychologic Depression?: No Anxiety?: No  Physical Exam: BP (!) 170/76 (BP Location: Left Arm, Patient Position: Sitting, Cuff Size: Large)   Pulse 61   Ht 5\' 5"  (1.651 m)   Wt 260 lb 14.4 oz (118.3 kg)   BMI 43.42 kg/m   Constitutional:  Alert and oriented, No acute distress. HEENT: [place] AT, moist mucus membranes.  Trachea midline, no masses. Cardiovascular: No clubbing, cyanosis, or edema. Respiratory: Normal respiratory effort, no increased work of breathing. GI: Abdomen is soft, nontender, nondistended, no abdominal masses GU: No CVA tenderness.  Skin: No rashes, bruises or suspicious lesions. Lymph: No cervical or inguinal adenopathy. Neurologic: Grossly intact, no focal deficits, moving all 4 extremities. Psychiatric: Normal mood and affect.  Laboratory Data: Lab Results  Component Value Date   WBC 8.2 10/11/2014   HGB 14.2 10/11/2014   HCT 43.0 10/11/2014   MCV 87 10/11/2014   PLT 206 10/11/2014    Lab Results  Component Value Date   CREATININE 0.94 10/11/2014    No results found for: PSA  No results found for: TESTOSTERONE  No results found for: HGBA1C  Urinalysis No results found for: COLORURINE, APPEARANCEUR, LABSPEC, PHURINE, GLUCOSEU, HGBUR, BILIRUBINUR, KETONESUR, PROTEINUR, UROBILINOGEN, NITRITE, LEUKOCYTESUR   Assessment & Plan:    1.  Urinary frequency Good results with vesicare 5 mg. Will continue medication  2. BPH Continue finasteride  3. Elevated PSA Patient's last PSA was in October 2017. It was 2.3 but since he is on finasteride, his adjusted PSA is 4.6. He did have a negative prostate biopsy in 2015 for a PSA of 4.2. His PSA is  elevated but only mildly and similar to his PSA at the time of his negative biopsy. I think the next step would be to repeat this PSA in 6 months to ensure it is not trending up.  4. Nephrolithiasis Check KUB to evaluate possible stone burden at time of next follow up  Return in about 6 months (around 01/28/2017) for PSA and KUB prior.  Nickie Retort, MD  [facility] [address] [place], [place] [address] [phone]

## 2017-01-16 ENCOUNTER — Ambulatory Visit
Admission: RE | Admit: 2017-01-16 | Discharge: 2017-01-16 | Disposition: A | Discharge status: Home / Self Care | Payer: Medicare Other | Source: Ambulatory Visit | Attending: Urology | Admitting: Urology

## 2017-01-16 ENCOUNTER — Other Ambulatory Visit: Payer: Medicare Other

## 2017-01-16 DIAGNOSIS — Z9049 Acquired absence of other specified parts of digestive tract: Secondary | ICD-10-CM | POA: Diagnosis not present

## 2017-01-16 DIAGNOSIS — N400 Enlarged prostate without lower urinary tract symptoms: Secondary | ICD-10-CM

## 2017-01-16 DIAGNOSIS — Z125 Encounter for screening for malignant neoplasm of prostate: Secondary | ICD-10-CM

## 2017-01-16 DIAGNOSIS — N200 Calculus of kidney: Secondary | ICD-10-CM | POA: Diagnosis not present

## 2017-01-17 LAB — PSA: Prostate Specific Ag, Serum: 2.4 ng/mL (ref 0.0–4.0)

## 2017-01-20 ENCOUNTER — Other Ambulatory Visit: Payer: Medicare Other

## 2017-01-22 ENCOUNTER — Encounter: Payer: Self-pay | Admitting: Urology

## 2017-01-22 ENCOUNTER — Ambulatory Visit (INDEPENDENT_AMBULATORY_CARE_PROVIDER_SITE_OTHER): Payer: Medicare Other | Admitting: Urology

## 2017-01-22 VITALS — BP 172/80 | HR 62 | Ht 65.0 in | Wt 260.6 lb

## 2017-01-22 DIAGNOSIS — N400 Enlarged prostate without lower urinary tract symptoms: Secondary | ICD-10-CM | POA: Diagnosis not present

## 2017-01-22 DIAGNOSIS — N200 Calculus of kidney: Secondary | ICD-10-CM

## 2017-01-22 DIAGNOSIS — N3281 Overactive bladder: Secondary | ICD-10-CM | POA: Diagnosis not present

## 2017-01-22 DIAGNOSIS — R972 Elevated prostate specific antigen [PSA]: Secondary | ICD-10-CM | POA: Diagnosis not present

## 2017-01-22 MED ORDER — FESOTERODINE FUMARATE ER 4 MG PO TB24: 4.0000 mg | ORAL_TABLET | Freq: Every day | ORAL | 11 refills | Status: DC

## 2017-01-22 NOTE — Progress Notes (Signed)
01/22/2017 11:14 AM   Isaiah Walker 1946-04-30 [id number]  Referring provider: [name], MD [place], [place] [address]  Chief Complaint  Patient presents with  . Elevated PSA    HPI: The patient is a 71 year old gentleman who presents today for follow-up.  1.  Urinary frequency Well controlled on vesicare. However this medication was too expensive and he stopped taking it. He has significant urinary frequency during the day with low-volume voids. He finds is very bothersome. He also significant urgency. These were well controlled and is taking Vesicare.  2. BPH The patient is on finasteride with no current obstructive symptoms.  3. Elevated PSA PSA in August 2018 was 2.4 (adjusted PSA 4.8) . PSA was 2.3 in October 2017 (adjusted PSA 4.6). He had a negative prostate biopsy in June 2015. His PSA at that time was 4.2 (no finasteride at that time). DRE was 30 g and benign in January 2018.  4. Nephrolithiasis He has a h/o left URS following a PCNL with follow up left URS in 10/2014 when a stent was placed. His recalls the stent was removed here in the office. KUB in August 2018 was positive only for collection of small stones spanning approximately 13 mm in the left lower pole.   PMH: Past Medical History:  Diagnosis Date  . Arthritis   . BPH (benign prostatic hyperplasia)   . Crohn's disease ([place])   . Hypertension   . Kidney stones     Surgical History: Past Surgical History:  Procedure Laterality Date  . CHOLECYSTECTOMY    . CHOLECYSTECTOMY, LAPAROSCOPIC N/A   . COLON SURGERY     Colectomy 2005  . CYSTOSCOPY/URETEROSCOPY/HOLMIUM LASER/STENT PLACEMENT Left 10/16/2014   Procedure: CYSTOSCOPY/URETEROSCOPY/HOLMIUM LASER/STENT PLACEMENT;  Surgeon: Hollice Espy, MD;  Location: [facility] ORS;  Service: Urology;  Laterality: Left;  . HERNIA REPAIR    . Percutaneous Left    Nephrolithotomy    Home Medications:  Allergies as of 01/22/2017      Reactions     Ivp Dye [iodinated Diagnostic Agents] Rash      Medication List       Accurate as of 01/22/17 11:14 AM. Always use your most recent med list.          allopurinol 300 MG tablet Commonly known as:  ZYLOPRIM Take 300 mg by mouth daily.   fesoterodine 4 MG Tb24 tablet Commonly known as:  TOVIAZ Take 1 tablet (4 mg total) by mouth daily.   finasteride 5 MG tablet Commonly known as:  PROSCAR Take 1 tablet (5 mg total) by mouth daily.   losartan 100 MG tablet Commonly known as:  COZAAR Take 100 mg by mouth daily.   meloxicam 15 MG tablet Commonly known as:  MOBIC Take 15 mg by mouth 2 (two) times daily.   metoprolol tartrate 50 MG tablet Commonly known as:  LOPRESSOR Take 50 mg by mouth 2 (two) times daily.   solifenacin 5 MG tablet Commonly known as:  VESICARE Take 1 tablet (5 mg total) by mouth daily.   traZODone 150 MG tablet Commonly known as:  DESYREL Take 150 mg by mouth at bedtime.       Allergies:  Allergies  Allergen Reactions  . Ivp Dye [Iodinated Diagnostic Agents] Rash    Family History: History reviewed. No pertinent family history.  Social History:  reports that he has never smoked. He has never used smokeless tobacco. He reports that he does not drink alcohol or use drugs.  ROS: UROLOGY Frequent Urination?: No Hard to postpone urination?: No Burning/pain with urination?: No Get up at night to urinate?: No Leakage of urine?: No Urine stream starts and stops?: No Trouble starting stream?: No Do you have to strain to urinate?: No Blood in urine?: No Urinary tract infection?: No Sexually transmitted disease?: No Injury to kidneys or bladder?: No Painful intercourse?: No Weak stream?: No Erection problems?: No Penile pain?: No  Gastrointestinal Nausea?: No Vomiting?: No Indigestion/heartburn?: No Diarrhea?: No Constipation?: No  Constitutional Fever: No Night sweats?: No Weight loss?: No Fatigue?: No  Skin Skin  rash/lesions?: No Itching?: No  Eyes Blurred vision?: No Double vision?: No  Ears/Nose/Throat Sore throat?: No Sinus problems?: No  Hematologic/Lymphatic Swollen glands?: No Easy bruising?: No  Cardiovascular Leg swelling?: No Chest pain?: No  Respiratory Cough?: No Shortness of breath?: No  Endocrine Excessive thirst?: No  Musculoskeletal Back pain?: No Joint pain?: No  Neurological Headaches?: No Dizziness?: No  Psychologic Depression?: No Anxiety?: No  Physical Exam: BP (!) 172/80 (BP Location: Left Arm, Patient Position: Sitting, Cuff Size: Normal)   Pulse 62   Ht 5\' 5"  (1.651 m)   Wt 260 lb 9.6 oz (118.2 kg)   BMI 43.37 kg/m   Constitutional:  Alert and oriented, No acute distress. HEENT: [place] AT, moist mucus membranes.  Trachea midline, no masses. Cardiovascular: No clubbing, cyanosis, or edema. Respiratory: Normal respiratory effort, no increased work of breathing. GI: Abdomen is soft, nontender, nondistended, no abdominal masses GU: No CVA tenderness.  Skin: No rashes, bruises or suspicious lesions. Lymph: No cervical or inguinal adenopathy. Neurologic: Grossly intact, no focal deficits, moving all 4 extremities. Psychiatric: Normal mood and affect.  Laboratory Data: Lab Results  Component Value Date   WBC 8.2 10/11/2014   HGB 14.2 10/11/2014   HCT 43.0 10/11/2014   MCV 87 10/11/2014   PLT 206 10/11/2014    Lab Results  Component Value Date   CREATININE 0.94 10/11/2014    No results found for: PSA  No results found for: TESTOSTERONE  No results found for: HGBA1C  Urinalysis No results found for: COLORURINE, APPEARANCEUR, LABSPEC, [place], GLUCOSEU, HGBUR, BILIRUBINUR, KETONESUR, PROTEINUR, UROBILINOGEN, NITRITE, LEUKOCYTESUR  Pertinent Imaging: KUB reviewed as above  Assessment & Plan:    1.  Urinary frequency Good results with vesicare 5 mg. However this was too expensive, we have started him on Toviaz 4 mg daily and given  him samples. If this remains to expensive, he'll ask his pharmacist for what anticholinergics are covered, or we can start him on Ditropan 5 mg 2-3 times per day.  2. BPH Continue finasteride  3. Elevated PSA PSA is relatively stable though very slight trend from 4.2 to 4.8 over the last 3 years since his last biopsy. We'll continue to check this every 6 months.  4. Nephrolithiasis Stone fragments and left lower pole. Will ensure stability with yearly KUB.  Return in about 6 months (around 07/25/2017) for PSA prior.  Nickie Retort, MD  [facility] [address], [place], [place] [address] [phone]

## 2017-02-02 ENCOUNTER — Telehealth: Payer: Self-pay

## 2017-02-02 DIAGNOSIS — N3281 Overactive bladder: Secondary | ICD-10-CM

## 2017-02-02 NOTE — Telephone Encounter (Signed)
Spoke with pt in reference to [place] for [place]. Pt stated that insurance will not cover toviaz until he has failed other medications. Per PA request from pharmacy insurance will cover oxybutynin ER, myrbetriq, and vesicare. Pt stated that he would be willing to try any of the named medications. Please advise.

## 2017-02-06 MED ORDER — SOLIFENACIN SUCCINATE 5 MG PO TABS: 5.0000 mg | ORAL_TABLET | Freq: Every day | ORAL | 11 refills | Status: DC

## 2017-02-06 NOTE — Telephone Encounter (Signed)
vesicare sent to pharmacy

## 2017-04-01 ENCOUNTER — Encounter
Admission: EM | Disposition: A | Discharge status: Home / Self Care | Payer: Self-pay | Source: Home / Self Care | Attending: Internal Medicine

## 2017-04-01 ENCOUNTER — Inpatient Hospital Stay: Payer: Medicare Other | Admitting: Anesthesiology

## 2017-04-01 ENCOUNTER — Inpatient Hospital Stay
Admission: EM | Admit: 2017-04-01 | Discharge: 2017-04-02 | DRG: 660 | Disposition: A | Discharge status: Home / Self Care | Payer: Medicare Other | Attending: Internal Medicine | Admitting: Internal Medicine

## 2017-04-01 ENCOUNTER — Emergency Department: Payer: Medicare Other

## 2017-04-01 ENCOUNTER — Encounter: Payer: Self-pay | Admitting: Emergency Medicine

## 2017-04-01 DIAGNOSIS — N390 Urinary tract infection, site not specified: Secondary | ICD-10-CM

## 2017-04-01 DIAGNOSIS — N289 Disorder of kidney and ureter, unspecified: Secondary | ICD-10-CM

## 2017-04-01 DIAGNOSIS — Z8249 Family history of ischemic heart disease and other diseases of the circulatory system: Secondary | ICD-10-CM | POA: Diagnosis not present

## 2017-04-01 DIAGNOSIS — Z791 Long term (current) use of non-steroidal anti-inflammatories (NSAID): Secondary | ICD-10-CM | POA: Diagnosis not present

## 2017-04-01 DIAGNOSIS — M109 Gout, unspecified: Secondary | ICD-10-CM | POA: Diagnosis not present

## 2017-04-01 DIAGNOSIS — N132 Hydronephrosis with renal and ureteral calculous obstruction: Secondary | ICD-10-CM

## 2017-04-01 DIAGNOSIS — M1990 Unspecified osteoarthritis, unspecified site: Secondary | ICD-10-CM | POA: Diagnosis present

## 2017-04-01 DIAGNOSIS — N3001 Acute cystitis with hematuria: Secondary | ICD-10-CM

## 2017-04-01 DIAGNOSIS — N179 Acute kidney failure, unspecified: Secondary | ICD-10-CM | POA: Diagnosis not present

## 2017-04-01 DIAGNOSIS — E669 Obesity, unspecified: Secondary | ICD-10-CM | POA: Diagnosis present

## 2017-04-01 DIAGNOSIS — I10 Essential (primary) hypertension: Secondary | ICD-10-CM | POA: Diagnosis not present

## 2017-04-01 DIAGNOSIS — N200 Calculus of kidney: Secondary | ICD-10-CM | POA: Diagnosis not present

## 2017-04-01 DIAGNOSIS — N400 Enlarged prostate without lower urinary tract symptoms: Secondary | ICD-10-CM | POA: Diagnosis present

## 2017-04-01 DIAGNOSIS — N136 Pyonephrosis: Principal | ICD-10-CM | POA: Diagnosis present

## 2017-04-01 DIAGNOSIS — I4581 Long QT syndrome: Secondary | ICD-10-CM | POA: Diagnosis present

## 2017-04-01 DIAGNOSIS — N23 Unspecified renal colic: Secondary | ICD-10-CM

## 2017-04-01 DIAGNOSIS — R739 Hyperglycemia, unspecified: Secondary | ICD-10-CM | POA: Diagnosis not present

## 2017-04-01 DIAGNOSIS — Z6841 Body Mass Index (BMI) 40.0 and over, adult: Secondary | ICD-10-CM | POA: Diagnosis not present

## 2017-04-01 DIAGNOSIS — Z79899 Other long term (current) drug therapy: Secondary | ICD-10-CM | POA: Diagnosis not present

## 2017-04-01 HISTORY — PX: CYSTOSCOPY W/ RETROGRADES: SHX1426

## 2017-04-01 HISTORY — PX: CYSTOSCOPY WITH STENT PLACEMENT: SHX5790

## 2017-04-01 LAB — URINALYSIS, COMPLETE (UACMP) WITH MICROSCOPIC
BILIRUBIN URINE: NEGATIVE
Glucose, UA: NEGATIVE mg/dL
KETONES UR: NEGATIVE mg/dL
Nitrite: NEGATIVE
Protein, ur: NEGATIVE mg/dL
SQUAMOUS EPITHELIAL / LPF: NONE SEEN
Specific Gravity, Urine: 1.019 (ref 1.005–1.030)
pH: 5 (ref 5.0–8.0)

## 2017-04-01 LAB — CBC
HEMATOCRIT: 46.8 % (ref 40.0–52.0)
Hemoglobin: 16.1 g/dL (ref 13.0–18.0)
MCH: 30.4 pg (ref 26.0–34.0)
MCHC: 34.3 g/dL (ref 32.0–36.0)
MCV: 88.5 fL (ref 80.0–100.0)
Platelets: 210 10*3/uL (ref 150–440)
RBC: 5.29 MIL/uL (ref 4.40–5.90)
RDW: 14 % (ref 11.5–14.5)
WBC: 15.1 10*3/uL — ABNORMAL HIGH (ref 3.8–10.6)

## 2017-04-01 LAB — COMPREHENSIVE METABOLIC PANEL
ALT: 41 U/L (ref 17–63)
AST: 31 U/L (ref 15–41)
Albumin: 4.7 g/dL (ref 3.5–5.0)
Alkaline Phosphatase: 37 U/L — ABNORMAL LOW (ref 38–126)
Anion gap: 10 (ref 5–15)
BUN: 35 mg/dL — AB (ref 6–20)
CO2: 23 mmol/L (ref 22–32)
Calcium: 9.8 mg/dL (ref 8.9–10.3)
Chloride: 104 mmol/L (ref 101–111)
Creatinine, Ser: 1.34 mg/dL — ABNORMAL HIGH (ref 0.61–1.24)
GFR calc Af Amer: >60 mL/min (ref >60)
GFR calc non Af Amer: 52 mL/min — ABNORMAL LOW (ref >60)
Glucose, Bld: 160 mg/dL — ABNORMAL HIGH (ref 65–99)
Potassium: 4.6 mmol/L (ref 3.5–5.1)
Sodium: 137 mmol/L (ref 135–145)
Total Bilirubin: 1.1 mg/dL (ref 0.3–1.2)
Total Protein: 8.1 g/dL (ref 6.5–8.1)

## 2017-04-01 LAB — LIPASE, BLOOD: LIPASE: 18 U/L (ref 11–51)

## 2017-04-01 SURGERY — CYSTOSCOPY, WITH STENT INSERTION
Anesthesia: General | Site: Ureter | Laterality: Bilateral | Wound class: Clean

## 2017-04-01 MED ORDER — HYDROCODONE-ACETAMINOPHEN 5-325 MG PO TABS: 1.0000 | ORAL_TABLET | ORAL | Status: DC | PRN

## 2017-04-01 MED ORDER — ONDANSETRON HCL 4 MG/2ML IJ SOLN
4.0000 mg | Freq: Once | INTRAMUSCULAR | Status: AC
  Administered 2017-04-01: 4 mg via INTRAVENOUS
  Filled 2017-04-01: qty 2

## 2017-04-01 MED ORDER — ALLOPURINOL 100 MG PO TABS
300.0000 mg | ORAL_TABLET | Freq: Every day | ORAL | Status: DC
  Administered 2017-04-01 – 2017-04-02 (×2): 300 mg via ORAL
  Filled 2017-04-01 (×2): qty 3

## 2017-04-01 MED ORDER — ENOXAPARIN SODIUM 40 MG/0.4ML ~~LOC~~ SOLN: 40.0000 mg | SUBCUTANEOUS | Status: DC

## 2017-04-01 MED ORDER — HYDROMORPHONE HCL 1 MG/ML IJ SOLN
1.0000 mg | Freq: Once | INTRAMUSCULAR | Status: AC
  Administered 2017-04-01: 1 mg via INTRAVENOUS
  Filled 2017-04-01: qty 1

## 2017-04-01 MED ORDER — PROPOFOL 10 MG/ML IV BOLUS
INTRAVENOUS | Status: DC | PRN
  Administered 2017-04-01: 150 mg via INTRAVENOUS

## 2017-04-01 MED ORDER — TRAZODONE HCL 100 MG PO TABS
150.0000 mg | ORAL_TABLET | Freq: Every day | ORAL | Status: DC
  Administered 2017-04-01: 150 mg via ORAL
  Filled 2017-04-01: qty 2

## 2017-04-01 MED ORDER — AMLODIPINE BESYLATE 5 MG PO TABS
2.5000 mg | ORAL_TABLET | Freq: Every day | ORAL | Status: DC
  Administered 2017-04-01 – 2017-04-02 (×2): 2.5 mg via ORAL
  Filled 2017-04-01 (×3): qty 1

## 2017-04-01 MED ORDER — FENTANYL CITRATE (PF) 100 MCG/2ML IJ SOLN: 25.0000 ug | INTRAMUSCULAR | Status: DC | PRN

## 2017-04-01 MED ORDER — SODIUM CHLORIDE 0.9 % IV BOLUS (SEPSIS)
1000.0000 mL | Freq: Once | INTRAVENOUS | Status: AC
  Administered 2017-04-01: 1000 mL via INTRAVENOUS

## 2017-04-01 MED ORDER — PHENYLEPHRINE HCL 10 MG/ML IJ SOLN
INTRAMUSCULAR | Status: DC | PRN
  Administered 2017-04-01: 100 ug via INTRAVENOUS
  Administered 2017-04-01: 200 ug via INTRAVENOUS

## 2017-04-01 MED ORDER — BARIUM SULFATE 2.1 % PO SUSP: 450.0000 mL | ORAL | Status: AC

## 2017-04-01 MED ORDER — PIPERACILLIN-TAZOBACTAM 3.375 G IVPB 30 MIN: 3.3750 g | Freq: Once | INTRAVENOUS | Status: DC

## 2017-04-01 MED ORDER — ONDANSETRON HCL 4 MG/2ML IJ SOLN: 4.0000 mg | Freq: Once | INTRAMUSCULAR | Status: DC | PRN

## 2017-04-01 MED ORDER — LIDOCAINE HCL (CARDIAC) 20 MG/ML IV SOLN
INTRAVENOUS | Status: DC | PRN
  Administered 2017-04-01: 80 mg via INTRAVENOUS

## 2017-04-01 MED ORDER — ONDANSETRON HCL 4 MG/2ML IJ SOLN
INTRAMUSCULAR | Status: DC | PRN
  Administered 2017-04-01: 4 mg via INTRAVENOUS

## 2017-04-01 MED ORDER — LOSARTAN POTASSIUM 50 MG PO TABS
100.0000 mg | ORAL_TABLET | Freq: Every day | ORAL | Status: DC
  Administered 2017-04-01 – 2017-04-02 (×2): 100 mg via ORAL
  Filled 2017-04-01 (×2): qty 2

## 2017-04-01 MED ORDER — SUCCINYLCHOLINE CHLORIDE 20 MG/ML IJ SOLN
INTRAMUSCULAR | Status: DC | PRN
  Administered 2017-04-01: 120 mg via INTRAVENOUS

## 2017-04-01 MED ORDER — LACTATED RINGERS IV SOLN
INTRAVENOUS | Status: DC | PRN
  Administered 2017-04-01: 12:00:00 via INTRAVENOUS

## 2017-04-01 MED ORDER — HYDRALAZINE HCL 20 MG/ML IJ SOLN: 10.0000 mg | Freq: Four times a day (QID) | INTRAMUSCULAR | Status: DC | PRN

## 2017-04-01 MED ORDER — ACETAMINOPHEN 650 MG RE SUPP
650.0000 mg | Freq: Four times a day (QID) | RECTAL | Status: DC | PRN
Start: 2017-04-01 — End: 2017-04-02

## 2017-04-01 MED ORDER — MIDAZOLAM HCL 2 MG/2ML IJ SOLN
INTRAMUSCULAR | Status: DC | PRN
  Administered 2017-04-01: 2 mg via INTRAVENOUS

## 2017-04-01 MED ORDER — IOTHALAMATE MEGLUMINE 43 % IV SOLN
INTRAVENOUS | Status: DC | PRN
  Administered 2017-04-01: 15 mL via URETHRAL

## 2017-04-01 MED ORDER — ACETAMINOPHEN 325 MG PO TABS: 650.0000 mg | ORAL_TABLET | Freq: Four times a day (QID) | ORAL | Status: DC | PRN

## 2017-04-01 MED ORDER — SODIUM CHLORIDE 0.9 % IV SOLN
INTRAVENOUS | Status: DC
  Administered 2017-04-01: 1000 mL via INTRAVENOUS
  Administered 2017-04-02: 10:00:00 via INTRAVENOUS

## 2017-04-01 MED ORDER — POLYETHYLENE GLYCOL 3350 17 G PO PACK: 17.0000 g | PACK | Freq: Every day | ORAL | Status: DC | PRN

## 2017-04-01 MED ORDER — CEFTRIAXONE SODIUM IN DEXTROSE 20 MG/ML IV SOLN
1.0000 g | Freq: Once | INTRAVENOUS | Status: AC
  Administered 2017-04-01: 1 g via INTRAVENOUS
  Filled 2017-04-01: qty 50

## 2017-04-01 MED ORDER — FENTANYL CITRATE (PF) 100 MCG/2ML IJ SOLN
INTRAMUSCULAR | Status: DC | PRN
  Administered 2017-04-01 (×2): 50 ug via INTRAVENOUS

## 2017-04-01 MED ORDER — MORPHINE SULFATE (PF) 2 MG/ML IV SOLN
2.0000 mg | INTRAVENOUS | Status: DC | PRN
  Administered 2017-04-01: 2 mg via INTRAVENOUS
  Filled 2017-04-01: qty 1

## 2017-04-01 MED ORDER — MELOXICAM 7.5 MG PO TABS
15.0000 mg | ORAL_TABLET | Freq: Every day | ORAL | Status: DC
  Administered 2017-04-02: 15 mg via ORAL
  Filled 2017-04-01 (×2): qty 2

## 2017-04-01 MED ORDER — ONDANSETRON HCL 4 MG PO TABS: 4.0000 mg | ORAL_TABLET | Freq: Four times a day (QID) | ORAL | Status: DC | PRN

## 2017-04-01 MED ORDER — KETOROLAC TROMETHAMINE 15 MG/ML IJ SOLN
15.0000 mg | Freq: Four times a day (QID) | INTRAMUSCULAR | Status: DC | PRN
  Administered 2017-04-01: 15 mg via INTRAVENOUS
  Filled 2017-04-01: qty 1

## 2017-04-01 MED ORDER — DEXAMETHASONE SODIUM PHOSPHATE 10 MG/ML IJ SOLN
INTRAMUSCULAR | Status: DC | PRN
  Administered 2017-04-01: 5 mg via INTRAVENOUS

## 2017-04-01 MED ORDER — ONDANSETRON HCL 4 MG/2ML IJ SOLN
4.0000 mg | Freq: Four times a day (QID) | INTRAMUSCULAR | Status: DC | PRN
  Administered 2017-04-01: 4 mg via INTRAVENOUS
  Filled 2017-04-01: qty 2

## 2017-04-01 MED ORDER — METOPROLOL TARTRATE 50 MG PO TABS
50.0000 mg | ORAL_TABLET | Freq: Two times a day (BID) | ORAL | Status: DC
  Administered 2017-04-01 – 2017-04-02 (×3): 50 mg via ORAL
  Filled 2017-04-01 (×3): qty 1

## 2017-04-01 MED ORDER — FINASTERIDE 5 MG PO TABS
5.0000 mg | ORAL_TABLET | Freq: Every day | ORAL | Status: DC
  Administered 2017-04-01 – 2017-04-02 (×2): 5 mg via ORAL
  Filled 2017-04-01 (×2): qty 1

## 2017-04-01 SURGICAL SUPPLY — 20 items
BAG DRAIN CYSTO-URO LG1000N (MISCELLANEOUS) ×3 IMPLANT
CATH URETL 5X70 OPEN END (CATHETERS) ×3 IMPLANT
CONRAY 43 FOR UROLOGY 50M (MISCELLANEOUS) ×3 IMPLANT
GLOVE BIO SURGEON STRL SZ 6.5 (GLOVE) ×2 IMPLANT
GLOVE BIO SURGEONS STRL SZ 6.5 (GLOVE) ×1
GOWN STRL REUS W/ TWL LRG LVL3 (GOWN DISPOSABLE) ×2 IMPLANT
GOWN STRL REUS W/TWL LRG LVL3 (GOWN DISPOSABLE) ×4
KIT RM TURNOVER CYSTO AR (KITS) ×3 IMPLANT
PACK CYSTO AR (MISCELLANEOUS) ×3 IMPLANT
SCRUB POVIDONE IODINE 4 OZ (MISCELLANEOUS) IMPLANT
SENSORWIRE 0.038 NOT ANGLED (WIRE) ×3
SET CYSTO W/LG BORE CLAMP LF (SET/KITS/TRAYS/PACK) ×3 IMPLANT
SOL .9 NS 3000ML IRR  AL (IV SOLUTION) ×2
SOL .9 NS 3000ML IRR UROMATIC (IV SOLUTION) ×1 IMPLANT
STENT URET 6FRX24 CONTOUR (STENTS) IMPLANT
STENT URET 6FRX26 CONTOUR (STENTS) ×6 IMPLANT
SURGILUBE 2OZ TUBE FLIPTOP (MISCELLANEOUS) ×3 IMPLANT
SYRINGE IRR TOOMEY STRL 70CC (SYRINGE) ×3 IMPLANT
WATER STERILE IRR 1000ML POUR (IV SOLUTION) ×3 IMPLANT
WIRE SENSOR 0.038 NOT ANGLED (WIRE) ×1 IMPLANT

## 2017-04-01 NOTE — ED Notes (Signed)
Attempted to call report x1 nurse unavailable at this time. 

## 2017-04-01 NOTE — Anesthesia Postprocedure Evaluation (Signed)
Anesthesia Post Note  Patient: Isaiah Walker  Procedure(s) Performed: CYSTOSCOPY WITH STENT PLACEMENT (Bilateral Ureter) CYSTOSCOPY WITH RETROGRADE PYELOGRAM (Bilateral Ureter)  Patient location during evaluation: PACU Anesthesia Type: General Level of consciousness: awake and alert Pain management: pain level controlled Vital Signs Assessment: post-procedure vital signs reviewed and stable Respiratory status: spontaneous breathing, nonlabored ventilation, respiratory function stable and patient connected to nasal cannula oxygen Cardiovascular status: blood pressure returned to baseline and stable Postop Assessment: no apparent nausea or vomiting Anesthetic complications: no     Last Vitals:  Vitals:   04/01/17 1304 04/01/17 1320  BP: (!) 159/84 (!) 151/82  Pulse: 96 98  Resp: 15 17  Temp:    SpO2: 94% 97%    Last Pain:  Vitals:   04/01/17 1320  TempSrc:   PainSc: 0-No pain                 Nomar Broad S

## 2017-04-01 NOTE — ED Notes (Signed)
Pt transported to OR via ED stretcher

## 2017-04-01 NOTE — Anesthesia Procedure Notes (Signed)
Procedure Name: Intubation Date/Time: 04/01/2017 12:20 PM Performed by: Letitia Neri Pre-anesthesia Checklist: Patient identified, Emergency Drugs available, Suction available, Patient being monitored and Timeout performed Patient Re-evaluated:Patient Re-evaluated prior to induction Oxygen Delivery Method: Circle system utilized Preoxygenation: Pre-oxygenation with 100% oxygen Induction Type: IV induction, Rapid sequence and Cricoid Pressure applied Laryngoscope Size: Mac and 3 Grade View: Grade I Tube type: Oral Tube size: 7.0 mm Number of attempts: 1 Airway Equipment and Method: Stylet Placement Confirmation: ETT inserted through vocal cords under direct vision,  positive ETCO2 and breath sounds checked- equal and bilateral Secured at: 22 cm Tube secured with: Tape Dental Injury: Teeth and Oropharynx as per pre-operative assessment

## 2017-04-01 NOTE — ED Triage Notes (Signed)
Pt c/o mid abdominal pain since last night, states he vomited this morning wife states it was darker than coffee grounds. Pt states he has hx of SBO in 2002 and sxs feel similar. Pt reports no diarrhea and vomited x1 today.  No diarrhea.

## 2017-04-01 NOTE — Op Note (Signed)
Date of procedure: 04/01/17  Preoperative diagnosis:  1. Right obstructing ureteral calculus 2. Left distal ureteral calculus 3. UTI   Postoperative diagnosis:  1. Same as above   Procedure: 1. Bilateral retrograde pyelogram 2. Bilateral ureteral stent placement  Surgeon: Hollice Espy, MD  Anesthesia: General  Complications: None  Intraoperative findings: Proximal 9 mm ureteral calculus appreciated on fluoroscopy. Severe hydronephrosis appreciated on retrograde pyelogram. No dissecting left distal calculus not easily seen on scout imaging, which appeared unremarkable. Stents placed without difficulty.  EBL: Minimal  Specimens: none  Drains:  6 x 26 French double-J ureteral stent bilaterally  Indication: Isaiah Walker is a 71 y.o. patient with bilateral ureteral calculi including obstructing large right proximal ureteral calculus in the setting of UTI.  After reviewing the management options for treatment, he elected to proceed with the above surgical procedure(s). We have discussed the potential benefits and risks of the procedure, side effects of the proposed treatment, the likelihood of the patient achieving the goals of the procedure, and any potential problems that might occur during the procedure or recuperation. Informed consent has been obtained.  Description of procedure:  The patient was taken to the operating room and general anesthesia was induced.  The patient was placed in the dorsal lithotomy position, prepped and draped in the usual sterile fashion, and preoperative antibiotics were administered. A preoperative time-out was performed.   A 21 French scope was advanced per urethra into the bladder. Of note, the patient had prostamegaly with a slightly elevated bladder neck. The bladder was probably trabeculated no other lesions were appreciated. Attention was sent to the left ureteral orifice which was cannulated using a 5 [place] open-ended ureteral catheter. A  gentle Sugar progress report of this side which revealed a decompressed ureter and collecting system which was delicate in appearance. There was no filling defects or hydronephrosis on the side. The 4 mm left distal ureteral Is not easily seen on scout imaging. The wire was then placed up to level of the renal pelvis. This advanced easily. A 6 x 26 French double-J ureteral stent was advanced over the wire up to level of the renal pelvis. The wire was partially drawn until a loop of the stent was seen minute upper pole calyx looping back down into the renal pelvis. The wire was then fully withdrawn and a full coil was noted in the bladder.  Attention was then turned to the right ureteral orifice. A retrograde pyelogram on the side revealed a somewhat dilated ureter with a significant transition point at the level of the stone was easily seen on scout imaging. The collecting system was noted to be severely hydronephrotic. The wire was then placed up to level of the kidney. The stone was able to be felt at the wire passed by the stone. A 6 x 26 French double-J ureteral stone was advanced over the wire up to level of the renal pelvis. The wire was partially drawn until full coil was noted within the renal pelvis. The wire was then fully withdrawn and a full course noted within the bladder. The bladder was then drained. The patient was then cleaned and dried, repositioned supine position, reversed anesthesia, taken the PACU in stable condition.  Plan: Patient be admitted overnight for antibiotics and observation. Ultimately, he will need bilateral ureteroscopy to treat his bilateral ureteral calculi. Findings were discussed with the patient's family. All questions answered.  Hollice Espy, M.D.

## 2017-04-01 NOTE — ED Notes (Signed)
Called CT and informed them patient finished with contrast   0919

## 2017-04-01 NOTE — Anesthesia Preprocedure Evaluation (Signed)
Anesthesia Evaluation  Patient identified by MRN, date of birth, ID band Patient awake    Reviewed: Allergy & Precautions, NPO status , Patient's Chart, lab work & pertinent test results, reviewed documented beta blocker date and time   Airway Mallampati: III  TM Distance: >3 FB     Dental  (+) Chipped, Partial Lower, Partial Upper   Pulmonary           Cardiovascular hypertension,      Neuro/Psych    GI/Hepatic   Endo/Other    Renal/GU      Musculoskeletal   Abdominal   Peds  Hematology   Anesthesia Other Findings Obese. Gout. Hx of Crohn's.  Reproductive/Obstetrics                             Anesthesia Physical Anesthesia Plan  ASA: III  Anesthesia Plan: General   Post-op Pain Management:    Induction: Intravenous, Rapid sequence and Cricoid pressure planned  PONV Risk Score and Plan:   Airway Management Planned: Oral ETT  Additional Equipment:   Intra-op Plan:   Post-operative Plan:   Informed Consent: I have reviewed the patients History and Physical, chart, labs and discussed the procedure including the risks, benefits and alternatives for the proposed anesthesia with the patient or authorized representative who has indicated his/her understanding and acceptance.     Plan Discussed with: CRNA  Anesthesia Plan Comments:         Anesthesia Quick Evaluation

## 2017-04-01 NOTE — Consult Note (Signed)
Urology Consult  I have been asked to see the patient by Dr. Mariea Clonts, for evaluation and management of right obstructing ureteral calculus, UTI  Chief Complaint: Right flank pain  History of Present Illness: Isaiah Walker is a 71 y.o. year old with a history of nephrolithiasis who presented to the emergency room earlier this morning with acute onset right flank pain overnight. He had associated nausea and vomiting. He denies fevers or chills. No dysuria or gross hematuria.  CT scan demonstrates a 9 mm starting right proximal ureteral calculus with severe hydronephrosis.  Additionally, he has a nonobstructing 4 mm left distal ureteral as well without hydronephrosis as well as a nonobstructing left lower pole calculus.   Additionally, he does have a mild leukocytosis, WBC 15, elevated creatinine 1.35 (baseline 0.9) and somewhat suspicious urinalysis with 5-30 red blood cells/white blood cells per high-powered field.     Past Medical History:  Diagnosis Date  . Arthritis   . BPH (benign prostatic hyperplasia)   . Crohn's disease ([place])   . Hypertension   . Kidney stones     Past Surgical History:  Procedure Laterality Date  . CHOLECYSTECTOMY    . CHOLECYSTECTOMY, LAPAROSCOPIC N/A   . COLON SURGERY     Colectomy 2005  . CYSTOSCOPY/URETEROSCOPY/HOLMIUM LASER/STENT PLACEMENT Left 10/16/2014   Procedure: CYSTOSCOPY/URETEROSCOPY/HOLMIUM LASER/STENT PLACEMENT;  Surgeon: Hollice Espy, MD;  Location: [facility] ORS;  Service: Urology;  Laterality: Left;  . HERNIA REPAIR    . Percutaneous Left    Nephrolithotomy    Home Medications:  Current Meds  Medication Sig  . allopurinol (ZYLOPRIM) 300 MG tablet Take 300 mg by mouth daily.  Marland Kitchen amLODipine (NORVASC) 2.5 MG tablet Take 2.5 mg by mouth daily.  . finasteride (PROSCAR) 5 MG tablet Take 1 tablet (5 mg total) by mouth daily.  Marland Kitchen losartan (COZAAR) 100 MG tablet Take 100 mg by mouth daily.  . meloxicam (MOBIC) 15 MG tablet Take 15 mg by mouth  daily.   . metoprolol (LOPRESSOR) 50 MG tablet Take 50 mg by mouth 2 (two) times daily.  . traZODone (DESYREL) 150 MG tablet Take 150 mg by mouth at bedtime.    Allergies:  Allergies  Allergen Reactions  . Ivp Dye [Iodinated Diagnostic Agents] Rash    Pt has had CT scan since. Pt states "i'm fine"    History reviewed. No pertinent family history.  Social History:  reports that he has never smoked. He has never used smokeless tobacco. He reports that he does not drink alcohol or use drugs.  ROS: A complete review of systems was performed.  All systems are negative except for pertinent findings as noted.  Physical Exam:  Vital signs in last 24 hours: Temp:  [97.4 F (36.3 C)] 97.4 F (36.3 C) (10/17 0704) Pulse Rate:  [78] 78 (10/17 0704) Resp:  [18] 18 (10/17 0704) BP: (160)/(65) 160/65 (10/17 0704) SpO2:  [97 %] 97 % (10/17 0704) Weight:  [255 lb (115.7 kg)] 255 lb (115.7 kg) (10/17 0705) Constitutional:  Alert and oriented, No acute distress.  Family at bedside. HEENT: [place] AT, moist mucus membranes.  Trachea midline, no masses Cardiovascular: Regular rate and rhythm, no clubbing, cyanosis, or edema. Respiratory: Normal respiratory effort, lungs clear bilaterally GI: Abdomen is soft, nontender, nondistended, no abdominal masses.  Obese. GU: Mild right CVA tenderness. Skin: No rashes, bruises or suspicious lesions Neurologic: Grossly intact, no focal deficits, moving all 4 extremities Psychiatric: Normal mood and affect   Laboratory Data:  Recent Labs  04/01/17 0703  WBC 15.1*  HGB 16.1  HCT 46.8    Recent Labs  04/01/17 0703  NA 137  K 4.6  CL 104  CO2 23  GLUCOSE 160*  BUN 35*  CREATININE 1.34*  CALCIUM 9.8    Radiologic Imaging: Ct Abdomen Pelvis Wo Contrast  Result Date: 04/01/2017 CLINICAL DATA:  History of cholecystectomy and colectomy. History of kidney stones and Crohn's disease. Abdominal distention. EXAM: CT ABDOMEN AND PELVIS WITHOUT  CONTRAST TECHNIQUE: Multidetector CT imaging of the abdomen and pelvis was performed following the standard protocol without IV contrast. COMPARISON:  09/26/2014 FINDINGS: Lower chest: Diffuse coronary artery calcifications. Mild cardiomegaly. Lung bases clear. No effusions. Hepatobiliary: No focal liver abnormality is seen. Status post cholecystectomy. No biliary dilatation. Pancreas: No focal abnormality or ductal dilatation. Spleen: No focal abnormality.  Normal size. Adrenals/Urinary Tract: Severe right hydronephrosis due to a 9 mm proximal right ureteral stone. Distal left ureteral stone also noted measuring 4 mm without hydronephrosis. Multiple nonobstructing left mid and lower pole stones. Diffuse cortical thinning within the kidneys. 5.5 cm low-density lesion off the anterior midpole of the right kidney, likely cyst. Stomach/Bowel: Prior partial scratched at prior right hemicolectomy. Small bowel loops are mildly prominent diffusely. No transition point. Scattered sigmoid diverticula. Stomach is grossly unremarkable. Vascular/Lymphatic: Aortic and iliac calcifications. No evidence of aneurysm or adenopathy. Reproductive:  No visible focal abnormality. Other: No free fluid or free air. Small bilateral inguinal hernias containing fat. Musculoskeletal: No acute bony abnormality. IMPRESSION: Severe right hydronephrosis with 9 mm proximal right ureteral stone. Left nephrolithiasis. 4 mm distal left ureteral stone without hydronephrosis on the left. Prior right hemicolectomy. Small bowel is diffusely prominent without caliber change. No convincing evidence for obstruction. Aortoiliac atherosclerosis.  Coronary artery disease. Small bilateral inguinal hernias containing fat. Electronically Signed   By: Rolm Baptise M.D.   On: 04/01/2017 09:38   CT scan was personally reviewed today with the patient.    Impression/Plan:  71 year old male with an obstructing right proximal ureteral stone, 9 mm as well as a  nonobstructing 4 mm left distal ureteral calculus, possible infection with elevated white count/ suspicious appearing urinalysis and acute kidney injury. Given the consolation of above, the highly recommended proceeding immediately to the operating room for cystoscopy, bilateral retrograde, bilateral ureteral stent. The only will this helped decompress the collecting system, will also help facilitate future intervention.  Risk and benefits were expanded detail as above. He certainly received IV ceftriaxone. I recommended overnight observation both for pain control and antibiotics. All questions were answered.  Consent obtained.   04/01/2017, 10:49 AM  Hollice Espy,  MD

## 2017-04-01 NOTE — ED Provider Notes (Addendum)
[facility] Emergency Department Provider Note  ____________________________________________  Time seen: Approximately 7:30 AM  I have reviewed the triage vital signs and the nursing notes.   HISTORY  Chief Complaint Abdominal Pain and Emesis    HPI Isaiah Walker is a 71 y.o. male s/p remote SBO requiring surgical decompression, cholecystectomy, appy, hernia presenting w/ diffuse abdominal pain, nausea and vomiting, abdominal distention, and constipation. The patient reportsthat over the past several days, he has had a sensation "like a stabbing me with a knife" that is most prominent at the umbilicus. He has had severe nausea and vomited for the first time on the way to the hospital this morning. He has been unable to have a bowel movement. He has had significant burping without flatus. No fevers or chills. No urinary symptoms.   Past Medical History:  Diagnosis Date  . Arthritis   . BPH (benign prostatic hyperplasia)   . Crohn's disease ([place])   . Hypertension   . Kidney stones     There are no active problems to display for this patient.   Past Surgical History:  Procedure Laterality Date  . CHOLECYSTECTOMY    . CHOLECYSTECTOMY, LAPAROSCOPIC N/A   . COLON SURGERY     Colectomy 2005  . CYSTOSCOPY/URETEROSCOPY/HOLMIUM LASER/STENT PLACEMENT Left 10/16/2014   Procedure: CYSTOSCOPY/URETEROSCOPY/HOLMIUM LASER/STENT PLACEMENT;  Surgeon: Hollice Espy, MD;  Location: [facility] ORS;  Service: Urology;  Laterality: Left;  . HERNIA REPAIR    . Percutaneous Left    Nephrolithotomy    Current Outpatient Rx  . Order #: [id number] Class: Historical Med  . Order #: [id number] Class: Historical Med  . Order #: [id number] Class: Fax  . Order #: [id number] Class: Historical Med  . Order #: [id number] Class: Historical Med  . Order #: [id number] Class: Historical Med  . Order #: [id number] Class: Historical Med  . Order #: [id number] Class: Normal  . Order #: [id number] Class:  Normal    Allergies Ivp dye [iodinated diagnostic agents]  History reviewed. No pertinent family history.  Social History Social History  Substance Use Topics  . Smoking status: Never Smoker  . Smokeless tobacco: Never Used  . Alcohol use No    Review of Systems Constitutional: No fever/chills.no lightheadedness or syncope. Eyes: No visual changes. ENT: No sore throat. No congestion or rhinorrhea. Cardiovascular: Denies chest pain. Denies palpitations. Respiratory: Denies shortness of breath.  No cough. Gastrointestinal: positive diffuseabdominal pain.  positivenausea, positivevomiting.  No diarrhea.  Positiveconstipation.positive burping, negative flatus. Genitourinary: Negative for dysuria. Musculoskeletal: Negative for back pain. Skin: Negative for rash. Neurological: Negative for headaches. No focal numbness, tingling or weakness.     ____________________________________________   PHYSICAL EXAM:  VITAL SIGNS: ED Triage Vitals  Enc Vitals Group     BP 04/01/17 0704 (!) 160/65     Pulse Rate 04/01/17 0704 78     Resp 04/01/17 0704 18     Temp 04/01/17 0704 (!) 97.4 F (36.3 C)     Temp Source 04/01/17 0704 Oral     SpO2 04/01/17 0704 97 %     Weight 04/01/17 0705 255 lb (115.7 kg)     Height 04/01/17 0705 5\' 6"  (1.676 m)     Head Circumference --      Peak Flow --      Pain Score 04/01/17 0704 10     Pain Loc --      Pain Edu? --      Excl. in [place]? --     Constitutional: Alert and oriented. uncomfortable appearing  but in no acute distress. Answers questions appropriately. Eyes: Conjunctivae are normal.  EOMI. No scleral icterus. Head: Atraumatic. Nose: No congestion/rhinnorhea. Mouth/Throat: Mucous membranes are moist.  Neck: No stridor.  Supple.   Cardiovascular: Normal rate, regular rhythm. No murmurs, rubs or gallops.  Respiratory: Normal respiratory effort.  No accessory muscle use or retractions. Lungs CTAB.  No wheezes, rales or  ronchi. Gastrointestinal: Soft, and nondistended.  Positive distention. Positive tenderness to palpation diffusely without focality.No guarding or rebound.  No peritoneal signs. Musculoskeletal: No LE edema.  Neurologic:  A&Ox3.  Speech is clear.  Face and smile are symmetric.  EOMI.  Moves all extremities well. Skin:  Skin is warm, dry and intact. No rash noted. Psychiatric: Mood and affect are normal. Speech and behavior are normal.  Normal judgement.  ____________________________________________   LABS (all labs ordered are listed, but only abnormal results are displayed)  Labs Reviewed  COMPREHENSIVE METABOLIC PANEL - Abnormal; Notable for the following:       Result Value   Glucose, Bld 160 (*)    BUN 35 (*)    Creatinine, Ser 1.34 (*)    Alkaline Phosphatase 37 (*)    GFR calc non Af Amer 52 (*)    All other components within normal limits  CBC - Abnormal; Notable for the following:    WBC 15.1 (*)    All other components within normal limits  URINALYSIS, COMPLETE (UACMP) WITH MICROSCOPIC - Abnormal; Notable for the following:    Color, Urine YELLOW (*)    APPearance CLEAR (*)    Hgb urine dipstick SMALL (*)    Leukocytes, UA SMALL (*)    Bacteria, UA RARE (*)    All other components within normal limits  URINE CULTURE  LIPASE, BLOOD   ____________________________________________  EKG  ED ECG REPORT I, Eula Listen, the attending physician, personally viewed and interpreted this ECG.   Date: 04/01/2017  EKG Time: 712  Rate: 86  Rhythm: normal sinus rhythm; RBBB  Axis: normal  Intervals:none  ST&T Change: No STEMI  ____________________________________________  RADIOLOGY  Ct Abdomen Pelvis Wo Contrast  Result Date: 04/01/2017 CLINICAL DATA:  History of cholecystectomy and colectomy. History of kidney stones and Crohn's disease. Abdominal distention. EXAM: CT ABDOMEN AND PELVIS WITHOUT CONTRAST TECHNIQUE: Multidetector CT imaging of the abdomen and  pelvis was performed following the standard protocol without IV contrast. COMPARISON:  09/26/2014 FINDINGS: Lower chest: Diffuse coronary artery calcifications. Mild cardiomegaly. Lung bases clear. No effusions. Hepatobiliary: No focal liver abnormality is seen. Status post cholecystectomy. No biliary dilatation. Pancreas: No focal abnormality or ductal dilatation. Spleen: No focal abnormality.  Normal size. Adrenals/Urinary Tract: Severe right hydronephrosis due to a 9 mm proximal right ureteral stone. Distal left ureteral stone also noted measuring 4 mm without hydronephrosis. Multiple nonobstructing left mid and lower pole stones. Diffuse cortical thinning within the kidneys. 5.5 cm low-density lesion off the anterior midpole of the right kidney, likely cyst. Stomach/Bowel: Prior partial scratched at prior right hemicolectomy. Small bowel loops are mildly prominent diffusely. No transition point. Scattered sigmoid diverticula. Stomach is grossly unremarkable. Vascular/Lymphatic: Aortic and iliac calcifications. No evidence of aneurysm or adenopathy. Reproductive:  No visible focal abnormality. Other: No free fluid or free air. Small bilateral inguinal hernias containing fat. Musculoskeletal: No acute bony abnormality. IMPRESSION: Severe right hydronephrosis with 9 mm proximal right ureteral stone. Left nephrolithiasis. 4 mm distal left ureteral stone without hydronephrosis on the left. Prior right hemicolectomy. Small bowel is diffusely prominent without caliber  change. No convincing evidence for obstruction. Aortoiliac atherosclerosis.  Coronary artery disease. Small bilateral inguinal hernias containing fat. Electronically Signed   By: Rolm Baptise M.D.   On: 04/01/2017 09:38    ____________________________________________   PROCEDURES  Procedure(s) performed: None  Procedures  Critical Care performed: No ____________________________________________   INITIAL IMPRESSION / ASSESSMENT AND PLAN /  ED COURSE  Pertinent labs & imaging results that were available during my care of the patient were reviewed by me and considered in my medical decision making (see chart for details).  71 y.o. Male with a history of multiple prior abdominal surgeries including SBO requiring surgical decompression presenting with abdominal distention, nausea and vomiting, constipation. The most likely etiology for the patient's symptoms is repeat SBO. I would also considerother intra-abdominal pathologies including infection.a cardiac cause for the patient's symptoms is much less likely but will do screening EKG. We'll get a urine to rule out UTI. Laboratory studies are pending at this time. We will initiate symptomatic treatment,get a CT of the abdomen without contrast as the patient has a contrast allergy, and reevaluate the patient for final disposition.  ----------------------------------------- 10:26 AM on 04/01/2017 -----------------------------------------  At this time, the patient has a CT scan which shows a 9 mm proximal right ureteral stone with severe right hydronephrosis. Initially, his pain was improved but now it has returned. His nausea has resolved. The patient does have acute renal insufficiency and signs of an infection, which is indication for urologic intervention. A consult has been placed.  ----------------------------------------- 10:47 AM on 04/01/2017 -----------------------------------------  I spoke with Dr. Erlene Quan, who will plan to place a stent in this patient today. He continues to be symptomatic and I have redosed his indications. I'll speak to the hospitalist about admission. ____________________________________________  FINAL CLINICAL IMPRESSION(S) / ED DIAGNOSES  Final diagnoses:  Acute renal insufficiency  Renal colic on right side  Acute cystitis with hematuria         NEW MEDICATIONS STARTED DURING THIS VISIT:  New Prescriptions   No medications on file       Eula Listen, MD 04/01/17 1029    Eula Listen, MD 04/01/17 1047

## 2017-04-01 NOTE — Transfer of Care (Signed)
Immediate Anesthesia Transfer of Care Note  Patient: Carolos Kazuma Elena  Procedure(s) Performed: CYSTOSCOPY WITH STENT PLACEMENT (Bilateral Ureter) CYSTOSCOPY WITH RETROGRADE PYELOGRAM (Bilateral Ureter)  Patient Location: PACU  Anesthesia Type:General  Level of Consciousness: sedated  Airway & Oxygen Therapy: Patient Spontanous Breathing and Patient connected to face mask oxygen  Post-op Assessment: Report given to RN and Post -op Vital signs reviewed and stable  Post vital signs: Reviewed and stable  Last Vitals:  Vitals:   04/01/17 1256 04/01/17 1304  BP:  (!) 159/84  Pulse: 96 96  Resp: 16 15  Temp:    SpO2: 93% 94%    Last Pain:  Vitals:   04/01/17 1304  TempSrc:   PainSc: 0-No pain         Complications: No apparent anesthesia complications

## 2017-04-01 NOTE — ED Notes (Signed)
Urologist in room speaking with patient

## 2017-04-01 NOTE — H&P (Signed)
[facility] [place] NAME: Isaiah Walker    MR#:  [id number]  DATE OF BIRTH:  [birth year]  DATE OF ADMISSION:  04/01/2017  PRIMARY CARE PHYSICIAN: Danelle Berry, NP   REQUESTING/REFERRING PHYSICIAN: dr Mariea Clonts  CHIEF COMPLAINT:  Abdominal pain and nausea HISTORY OF PRESENT ILLNESS:  Isaiah Walker  is a 71 y.o. male with a known history of BPH and essential hypertension who presents with above complaint patient reports over the past few days he's had abdominal pain and nausea with inability to eat. He is also complaining of right-sided back pain over the past few days. He presents to the emergency room due to abdominal pain and nausea. In the emergency room CT scan shows severe right hydronephrosis with 9 mm proximal right ureteral stone.o evidence of bowel obstruction Urology has been consulted and plan is for ureteral stent today.  PAST MEDICAL HISTORY:   Past Medical History:  Diagnosis Date  . Arthritis   . BPH (benign prostatic hyperplasia)   . Crohn's disease ([place])   . Hypertension   . Kidney stones     PAST SURGICAL HISTORY:   Past Surgical History:  Procedure Laterality Date  . CHOLECYSTECTOMY    . CHOLECYSTECTOMY, LAPAROSCOPIC N/A   . COLON SURGERY     Colectomy 2005  . CYSTOSCOPY/URETEROSCOPY/HOLMIUM LASER/STENT PLACEMENT Left 10/16/2014   Procedure: CYSTOSCOPY/URETEROSCOPY/HOLMIUM LASER/STENT PLACEMENT;  Surgeon: Hollice Espy, MD;  Location: [facility] ORS;  Service: Urology;  Laterality: Left;  . HERNIA REPAIR    . Percutaneous Left    Nephrolithotomy    SOCIAL HISTORY:   Social History  Substance Use Topics  . Smoking status: Never Smoker  . Smokeless tobacco: Never Used  . Alcohol use No    FAMILY HISTORY:  Positive hypertension  DRUG ALLERGIES:   Allergies  Allergen Reactions  . Ivp Dye [Iodinated Diagnostic Agents] Rash    Pt has had CT scan since. Pt states "i'm fine"    REVIEW OF SYSTEMS:   Review of  Systems  Constitutional: Negative.  Negative for chills, fever and malaise/fatigue.  HENT: Negative.  Negative for ear discharge, ear pain, hearing loss, nosebleeds and sore throat.   Eyes: Negative.  Negative for blurred vision and pain.  Respiratory: Negative.  Negative for cough, hemoptysis, shortness of breath and wheezing.   Cardiovascular: Negative.  Negative for chest pain, palpitations and leg swelling.  Gastrointestinal: Positive for abdominal pain, nausea and vomiting. Negative for blood in stool and diarrhea.  Genitourinary: Negative.  Negative for dysuria.  Musculoskeletal: Positive for back pain.  Skin: Negative.   Neurological: Negative for dizziness, tremors, speech change, focal weakness, seizures and headaches.  Endo/Heme/Allergies: Negative.  Does not bruise/bleed easily.  Psychiatric/Behavioral: Negative.  Negative for depression, hallucinations and suicidal ideas.    MEDICATIONS AT HOME:   Prior to Admission medications   Medication Sig Start Date End Date Taking? Authorizing Provider  allopurinol (ZYLOPRIM) 300 MG tablet Take 300 mg by mouth daily.   Yes [provider]  amLODipine (NORVASC) 2.5 MG tablet Take 2.5 mg by mouth daily.   Yes [provider]  finasteride (PROSCAR) 5 MG tablet Take 1 tablet (5 mg total) by mouth daily. 04/20/15  Yes Nickie Retort, MD  losartan (COZAAR) 100 MG tablet Take 100 mg by mouth daily.   Yes [provider]  meloxicam (MOBIC) 15 MG tablet Take 15 mg by mouth daily.    Yes [provider]  metoprolol (LOPRESSOR)  50 MG tablet Take 50 mg by mouth 2 (two) times daily.   Yes [provider]  traZODone (DESYREL) 150 MG tablet Take 150 mg by mouth at bedtime.   Yes [provider]  fesoterodine (TOVIAZ) 4 MG TB24 tablet Take 1 tablet (4 mg total) by mouth daily. Patient not taking: Reported on 04/01/2017 01/22/17   Nickie Retort, MD  solifenacin (VESICARE) 5 MG tablet Take 1  tablet (5 mg total) by mouth daily. Patient not taking: Reported on 04/01/2017 02/06/17   Nickie Retort, MD      VITAL SIGNS:  Blood pressure (!) 162/76, pulse 89, temperature (!) 97.4 F (36.3 C), temperature source Oral, resp. rate 18, height 5\' 6"  (1.676 m), weight 115.7 kg (255 lb), SpO2 96 %.  PHYSICAL EXAMINATION:   Physical Exam  Constitutional: He is oriented to person, place, and time and well-developed, well-nourished, and in no distress. No distress.  HENT:  Head: Normocephalic.  Eyes: No scleral icterus.  Neck: Normal range of motion. Neck supple. No JVD present. No tracheal deviation present.  Cardiovascular: Normal rate, regular rhythm and normal heart sounds.  Exam reveals no gallop and no friction rub.   No murmur heard. Pulmonary/Chest: Effort normal and breath sounds normal. No respiratory distress. He has no wheezes. He has no rales. He exhibits no tenderness.  Abdominal: Soft. Bowel sounds are normal. He exhibits no distension and no mass. There is no tenderness. There is no rebound and no guarding.  Musculoskeletal: Normal range of motion. He exhibits no edema.  Neurological: He is alert and oriented to person, place, and time.  Skin: Skin is warm. No rash noted. No erythema.  Psychiatric: Affect and judgment normal.      LABORATORY PANEL:   CBC  Recent Labs Lab 04/01/17 0703  WBC 15.1*  HGB 16.1  HCT 46.8  PLT 210   ------------------------------------------------------------------------------------------------------------------  Chemistries   Recent Labs Lab 04/01/17 0703  NA 137  K 4.6  CL 104  CO2 23  GLUCOSE 160*  BUN 35*  CREATININE 1.34*  CALCIUM 9.8  AST 31  ALT 41  ALKPHOS 37*  BILITOT 1.1   ------------------------------------------------------------------------------------------------------------------  Cardiac Enzymes No results for input(s): TROPONINI in the last 168  hours. ------------------------------------------------------------------------------------------------------------------  RADIOLOGY:  Ct Abdomen Pelvis Wo Contrast  Result Date: 04/01/2017 CLINICAL DATA:  History of cholecystectomy and colectomy. History of kidney stones and Crohn's disease. Abdominal distention. EXAM: CT ABDOMEN AND PELVIS WITHOUT CONTRAST TECHNIQUE: Multidetector CT imaging of the abdomen and pelvis was performed following the standard protocol without IV contrast. COMPARISON:  09/26/2014 FINDINGS: Lower chest: Diffuse coronary artery calcifications. Mild cardiomegaly. Lung bases clear. No effusions. Hepatobiliary: No focal liver abnormality is seen. Status post cholecystectomy. No biliary dilatation. Pancreas: No focal abnormality or ductal dilatation. Spleen: No focal abnormality.  Normal size. Adrenals/Urinary Tract: Severe right hydronephrosis due to a 9 mm proximal right ureteral stone. Distal left ureteral stone also noted measuring 4 mm without hydronephrosis. Multiple nonobstructing left mid and lower pole stones. Diffuse cortical thinning within the kidneys. 5.5 cm low-density lesion off the anterior midpole of the right kidney, likely cyst. Stomach/Bowel: Prior partial scratched at prior right hemicolectomy. Small bowel loops are mildly prominent diffusely. No transition point. Scattered sigmoid diverticula. Stomach is grossly unremarkable. Vascular/Lymphatic: Aortic and iliac calcifications. No evidence of aneurysm or adenopathy. Reproductive:  No visible focal abnormality. Other: No free fluid or free air. Small bilateral inguinal hernias containing fat. Musculoskeletal: No acute bony abnormality. IMPRESSION:  Severe right hydronephrosis with 9 mm proximal right ureteral stone. Left nephrolithiasis. 4 mm distal left ureteral stone without hydronephrosis on the left. Prior right hemicolectomy. Small bowel is diffusely prominent without caliber change. No convincing evidence for  obstruction. Aortoiliac atherosclerosis.  Coronary artery disease. Small bilateral inguinal hernias containing fat. Electronically Signed   By: Rolm Baptise M.D.   On: 04/01/2017 09:38    EKG:  Sinus rhythm no ST elevation or depression  IMPRESSION AND PLAN:   71 year old male with history of hypertension and BPH who presents with abdominal pain and found to have right hydronephrosis and a 9 mm proximal right ureteral stone.  1. Right hydronephrosis, severe with 9 mm proximal right ureteral stone and left nephrolithiasis with 4 mm distal left ureteral stone without evidence of hydronephrosis: Urology consultation requested Plan for ureteral stent Continue pain control with IV fluids and antiemetics  2. EHU:[id number] finasteride  3. Essential hypertension: Continue Norvasc, losartan, metoprolol When necessary hydralazine ordered as well  4. History of Crohn's disease  5. Hyperglycemia: Recheck BMP in a.m.    All the records are reviewed and case discussed with ED provider. Management plans discussed with the patient and family and he is in agreement  CODE STATUS: DNR  TOTAL TIME TAKING CARE OF THIS PATIENT: 43 minutes.    Dodie Parisi M.D on 04/01/2017 at 11:21 AM  Between 7am to 6pm - Pager - [phone]  After 6pm go to [email or web address] - password [email or web address] [facility]  Office  [phone]  CC: Primary care physician; Danelle Berry, NP

## 2017-04-01 NOTE — Anesthesia Post-op Follow-up Note (Signed)
Anesthesia QCDR form completed.        

## 2017-04-02 DIAGNOSIS — N289 Disorder of kidney and ureter, unspecified: Secondary | ICD-10-CM

## 2017-04-02 DIAGNOSIS — N3001 Acute cystitis with hematuria: Secondary | ICD-10-CM

## 2017-04-02 DIAGNOSIS — N136 Pyonephrosis: Secondary | ICD-10-CM | POA: Diagnosis not present

## 2017-04-02 LAB — BASIC METABOLIC PANEL
Anion gap: 8 (ref 5–15)
BUN: 31 mg/dL — AB (ref 6–20)
CALCIUM: 8.5 mg/dL — AB (ref 8.9–10.3)
CO2: 24 mmol/L (ref 22–32)
CREATININE: 1.37 mg/dL — AB (ref 0.61–1.24)
Chloride: 107 mmol/L (ref 101–111)
GFR calc Af Amer: 59 mL/min — ABNORMAL LOW (ref >60)
GFR calc non Af Amer: 51 mL/min — ABNORMAL LOW (ref >60)
Glucose, Bld: 124 mg/dL — ABNORMAL HIGH (ref 65–99)
Potassium: 4.3 mmol/L (ref 3.5–5.1)
SODIUM: 139 mmol/L (ref 135–145)

## 2017-04-02 LAB — CBC
HCT: 44.7 % (ref 40.0–52.0)
Hemoglobin: 14.9 g/dL (ref 13.0–18.0)
MCH: 30 pg (ref 26.0–34.0)
MCHC: 33.2 g/dL (ref 32.0–36.0)
MCV: 90.3 fL (ref 80.0–100.0)
PLATELETS: 205 10*3/uL (ref 150–440)
RBC: 4.95 MIL/uL (ref 4.40–5.90)
RDW: 13.9 % (ref 11.5–14.5)
WBC: 13.9 10*3/uL — AB (ref 3.8–10.6)

## 2017-04-02 LAB — URINE CULTURE: Culture: NO GROWTH

## 2017-04-02 MED ORDER — HYDROCODONE-ACETAMINOPHEN 5-325 MG PO TABS: 1.0000 | ORAL_TABLET | Freq: Four times a day (QID) | ORAL | 0 refills | Status: DC | PRN

## 2017-04-02 NOTE — Discharge Summary (Signed)
[facility] [place] NAME: Isaiah Walker    MR#:  [id number]  DATE OF BIRTH:  [birth year]  DATE OF ADMISSION:  04/01/2017 ADMITTING PHYSICIAN: Hollice Espy, MD  DATE OF DISCHARGE: 04/02/17  PRIMARY CARE PHYSICIAN: Danelle Berry, NP    ADMISSION DIAGNOSIS:  Acute renal insufficiency [Q68.3] Renal colic on right side [M19] Acute cystitis with hematuria [N30.01]  DISCHARGE DIAGNOSIS:  Right severe hydronephrosis due to right proximal ureteral stone s/p Right Ureteral stent placement on 04/01/17 Left  Distal ureteral stone s/p left ureteral stent placemen on 04/01/17 nephrolithiasis   SECONDARY DIAGNOSIS:   Past Medical History:  Diagnosis Date  . Arthritis   . BPH (benign prostatic hyperplasia)   . Crohn's disease ([place])   . Hypertension   . Kidney stones     HOSPITAL COURSE:   71 year old male with history of hypertension and BPH who presents with abdominal pain and found to have right hydronephrosis and a 9 mm proximal right ureteral stone.  1. Right hydronephrosis, severe with 9 mm proximal right ureteral stone and left nephrolithiasis with 4 mm distal left ureteral stone without evidence of hydronephrosis: Urology consultation with Dr Erlene Quan S/p bilateral ureteral stent prn pain control  2. QQI:[id number] finasteride  3. Essential hypertension: Continue Norvasc, losartan, metoprolol When necessary hydralazine ordered as well  4. History of Crohn's disease  Overall stable D/c home with out pt f/u with Dr Erlene Quan CONSULTS OBTAINED:  Treatment Team:  Hollice Espy, MD  DRUG ALLERGIES:   Allergies  Allergen Reactions  . Ivp Dye [Iodinated Diagnostic Agents] Rash    Pt has had CT scan since. Pt states "i'm fine"    DISCHARGE MEDICATIONS:   Current Discharge Medication List    START taking these medications   Details  HYDROcodone-acetaminophen (NORCO/VICODIN) 5-325 MG tablet Take 1-2 tablets by  mouth every 6 (six) hours as needed for moderate pain. Qty: 20 tablet, Refills: 0      CONTINUE these medications which have NOT CHANGED   Details  allopurinol (ZYLOPRIM) 300 MG tablet Take 300 mg by mouth daily.    amLODipine (NORVASC) 2.5 MG tablet Take 2.5 mg by mouth daily.    finasteride (PROSCAR) 5 MG tablet Take 1 tablet (5 mg total) by mouth daily. Qty: 90 tablet, Refills: 5   Associated Diagnoses: BPH (benign prostatic hyperplasia)    losartan (COZAAR) 100 MG tablet Take 100 mg by mouth daily.    meloxicam (MOBIC) 15 MG tablet Take 15 mg by mouth daily.     metoprolol (LOPRESSOR) 50 MG tablet Take 50 mg by mouth 2 (two) times daily.    traZODone (DESYREL) 150 MG tablet Take 150 mg by mouth at bedtime.    fesoterodine (TOVIAZ) 4 MG TB24 tablet Take 1 tablet (4 mg total) by mouth daily. Qty: 30 tablet, Refills: 11    solifenacin (VESICARE) 5 MG tablet Take 1 tablet (5 mg total) by mouth daily. Qty: 30 tablet, Refills: 11   Associated Diagnoses: OAB (overactive bladder)        If you experience worsening of your admission symptoms, develop shortness of breath, life threatening emergency, suicidal or homicidal thoughts you must seek medical attention immediately by calling 911 or calling your MD immediately  if symptoms less severe.  You Must read complete instructions/literature along with all the possible adverse reactions/side effects for all the Medicines you take and that have been prescribed to you. Take any new Medicines after you have  completely understood and accept all the possible adverse reactions/side effects.   Please note  You were cared for by a hospitalist during your hospital stay. If you have any questions about your discharge medications or the care you received while you were in the hospital after you are discharged, you can call the unit and asked to speak with the hospitalist on call if the hospitalist that took care of you is not available. Once you  are discharged, your primary care physician will handle any further medical issues. Please note that NO REFILLS for any discharge medications will be authorized once you are discharged, as it is imperative that you return to your primary care physician (or establish a relationship with a primary care physician if you do not have one) for your aftercare needs so that they can reassess your need for medications and monitor your lab values. Today   SUBJECTIVE   No new complaints  VITAL SIGNS:  Blood pressure 133/60, pulse 74, temperature 98.1 F (36.7 C), temperature source Oral, resp. rate 18, height 5\' 6"  (1.676 m), weight 115.7 kg (255 lb), SpO2 98 %.  I/O:   Intake/Output Summary (Last 24 hours) at 04/02/17 1245 Last data filed at 04/02/17 1027  Gross per 24 hour  Intake             3114 ml  Output              805 ml  Net             2309 ml    PHYSICAL EXAMINATION:  GENERAL:  71 y.o.-year-old patient lying in the bed with no acute distress. obese EYES: Pupils equal, round, reactive to light and accommodation. No scleral icterus. Extraocular muscles intact.  HEENT: Head atraumatic, normocephalic. Oropharynx and nasopharynx clear.  NECK:  Supple, no jugular venous distention. No thyroid enlargement, no tenderness.  LUNGS: Normal breath sounds bilaterally, no wheezing, rales,rhonchi or crepitation. No use of accessory muscles of respiration.  CARDIOVASCULAR: S1, S2 normal. No murmurs, rubs, or gallops.  ABDOMEN: Soft, non-tender, non-distended. Bowel sounds present. No organomegaly or mass.  EXTREMITIES: No pedal edema, cyanosis, or clubbing.  NEUROLOGIC: Cranial nerves II through XII are intact. Muscle strength 5/5 in all extremities. Sensation intact. Gait not checked.  PSYCHIATRIC: The patient is alert and oriented x 3.  SKIN: No obvious rash, lesion, or ulcer.   DATA REVIEW:   CBC   Recent Labs Lab 04/02/17 0444  WBC 13.9*  HGB 14.9  HCT 44.7  PLT 205    Chemistries    Recent Labs Lab 04/01/17 0703 04/02/17 0444  NA 137 139  K 4.6 4.3  CL 104 107  CO2 23 24  GLUCOSE 160* 124*  BUN 35* 31*  CREATININE 1.34* 1.37*  CALCIUM 9.8 8.5*  AST 31  --   ALT 41  --   ALKPHOS 37*  --   BILITOT 1.1  --     Microbiology Results   No results found for this or any previous visit (from the past 240 hour(s)).  RADIOLOGY:  Ct Abdomen Pelvis Wo Contrast  Result Date: 04/01/2017 CLINICAL DATA:  History of cholecystectomy and colectomy. History of kidney stones and Crohn's disease. Abdominal distention. EXAM: CT ABDOMEN AND PELVIS WITHOUT CONTRAST TECHNIQUE: Multidetector CT imaging of the abdomen and pelvis was performed following the standard protocol without IV contrast. COMPARISON:  09/26/2014 FINDINGS: Lower chest: Diffuse coronary artery calcifications. Mild cardiomegaly. Lung bases clear. No effusions. Hepatobiliary: No focal liver  abnormality is seen. Status post cholecystectomy. No biliary dilatation. Pancreas: No focal abnormality or ductal dilatation. Spleen: No focal abnormality.  Normal size. Adrenals/Urinary Tract: Severe right hydronephrosis due to a 9 mm proximal right ureteral stone. Distal left ureteral stone also noted measuring 4 mm without hydronephrosis. Multiple nonobstructing left mid and lower pole stones. Diffuse cortical thinning within the kidneys. 5.5 cm low-density lesion off the anterior midpole of the right kidney, likely cyst. Stomach/Bowel: Prior partial scratched at prior right hemicolectomy. Small bowel loops are mildly prominent diffusely. No transition point. Scattered sigmoid diverticula. Stomach is grossly unremarkable. Vascular/Lymphatic: Aortic and iliac calcifications. No evidence of aneurysm or adenopathy. Reproductive:  No visible focal abnormality. Other: No free fluid or free air. Small bilateral inguinal hernias containing fat. Musculoskeletal: No acute bony abnormality. IMPRESSION: Severe right hydronephrosis with 9 mm  proximal right ureteral stone. Left nephrolithiasis. 4 mm distal left ureteral stone without hydronephrosis on the left. Prior right hemicolectomy. Small bowel is diffusely prominent without caliber change. No convincing evidence for obstruction. Aortoiliac atherosclerosis.  Coronary artery disease. Small bilateral inguinal hernias containing fat. Electronically Signed   By: Rolm Baptise M.D.   On: 04/01/2017 09:38     Management plans discussed with the patient, family and they are in agreement.  CODE STATUS:     Code Status Orders        Start     Ordered   04/01/17 1406  Do not attempt resuscitation (DNR)  Continuous    Question Answer Comment  In the event of cardiac or respiratory ARREST Do not call a "code blue"   In the event of cardiac or respiratory ARREST Do not perform Intubation, CPR, defibrillation or ACLS   In the event of cardiac or respiratory ARREST Use medication by any route, position, wound care, and other measures to relive pain and suffering. May use oxygen, suction and manual treatment of airway obstruction as needed for comfort.      04/01/17 1405    Code Status History    Date Active Date Inactive Code Status Order ID Comments User Context   This patient has a current code status but no historical code status.      TOTAL TIME TAKING CARE OF THIS PATIENT: *40 minutes.    Shenoa Hattabaugh M.D on 04/02/2017 at 12:45 PM  Between 7am to 6pm - Pager - [phone] After 6pm go to [email or web address] - password [email or web address] [facility]  Office  [phone]  CC: Primary care physician; Danelle Berry, NP

## 2017-04-02 NOTE — Progress Notes (Addendum)
Patient received discharge instructions. Explaine instructions and answered all pt questions. IV removed. Belongings gathered. Patient awaiting ride.   Patient wheeled to visitors entrance to go home.

## 2017-04-02 NOTE — Care Management Important Message (Signed)
Important Message  Patient Details  Name: Isaiah Walker MRN: [id number] Date of Birth: 10/25/1945   Medicare Important Message Given:  N/A - LOS <3 / Initial given by admissions    Beverly Sessions, RN 04/02/2017, 2:05 PM

## 2017-04-02 NOTE — H&P (View-Only) (Signed)
Urology Consult Follow Up  Subjective: No fevers overnight. Some discomfort with the stent, urinary frequency. Reports loose stools at a higher frequency than normal. No further vomiting. Pain improved overall.  Anti-infectives: Anti-infectives    Start     Dose/Rate Route Frequency Ordered Stop   04/01/17 1030  piperacillin-tazobactam (ZOSYN) IVPB 3.375 g  Status:  Discontinued     3.375 g 100 mL/hr over 30 Minutes Intravenous  Once 04/01/17 1021 04/01/17 1023   04/01/17 1030  cefTRIAXone (ROCEPHIN) 1 g in dextrose 5 % 50 mL IVPB - Premix     1 g 100 mL/hr over 30 Minutes Intravenous  Once 04/01/17 1023 04/01/17 1116      Current Facility-Administered Medications  Medication Dose Route Frequency Provider Last Rate Last Dose  . acetaminophen (TYLENOL) tablet 650 mg  650 mg Oral Q6H PRN Bettey Costa, MD       Or  . acetaminophen (TYLENOL) suppository 650 mg  650 mg Rectal Q6H PRN Mody, Sital, MD      . allopurinol (ZYLOPRIM) tablet 300 mg  300 mg Oral Daily Mody, Sital, MD   300 mg at 04/02/17 1024  . amLODipine (NORVASC) tablet 2.5 mg  2.5 mg Oral Daily Mody, Sital, MD   2.5 mg at 04/02/17 1026  . enoxaparin (LOVENOX) injection 40 mg  40 mg Subcutaneous Q24H Mody, Sital, MD      . finasteride (PROSCAR) tablet 5 mg  5 mg Oral Daily Mody, Sital, MD   5 mg at 04/02/17 1025  . hydrALAZINE (APRESOLINE) injection 10 mg  10 mg Intravenous Q6H PRN Mody, Sital, MD      . HYDROcodone-acetaminophen (NORCO/VICODIN) 5-325 MG per tablet 1-2 tablet  1-2 tablet Oral Q4H PRN Mody, Sital, MD      . ketorolac (TORADOL) 15 MG/ML injection 15 mg  15 mg Intravenous Q6H PRN Bettey Costa, MD   15 mg at 04/01/17 1844  . losartan (COZAAR) tablet 100 mg  100 mg Oral Daily Mody, Sital, MD   100 mg at 04/02/17 1025  . meloxicam (MOBIC) tablet 15 mg  15 mg Oral Daily Bettey Costa, MD   15 mg at 04/02/17 1025  . metoprolol tartrate (LOPRESSOR) tablet 50 mg  50 mg Oral BID Bettey Costa, MD   50 mg at 04/02/17 1025  .  morphine 2 MG/ML injection 2 mg  2 mg Intravenous Q4H PRN Bettey Costa, MD   2 mg at 04/01/17 1430  . ondansetron (ZOFRAN) tablet 4 mg  4 mg Oral Q6H PRN Bettey Costa, MD       Or  . ondansetron (ZOFRAN) injection 4 mg  4 mg Intravenous Q6H PRN Bettey Costa, MD   4 mg at 04/01/17 1440  . polyethylene glycol (MIRALAX / GLYCOLAX) packet 17 g  17 g Oral Daily PRN Bettey Costa, MD      . traZODone (DESYREL) tablet 150 mg  150 mg Oral QHS Bettey Costa, MD   150 mg at 04/01/17 2230     Objective: Vital signs in last 24 hours: Temp:  [97.7 F (36.5 C)-98.1 F (36.7 C)] 98.1 F (36.7 C) (10/18 1137) Pulse Rate:  [74-93] 74 (10/18 1137) Resp:  [18-19] 18 (10/18 1137) BP: (133-164)/(60-75) 133/60 (10/18 1137) SpO2:  [92 %-98 %] 98 % (10/18 1137)  Intake/Output from previous day: 10/17 0701 - 10/18 0700 In: 3102 [P.O.:240; I.V.:1862; IV Piggyback:1000] Out: 805 [Urine:805] Intake/Output this shift: Total I/O In: 1552 [P.O.:480; I.V.:1072] Out: -    Physical Exam  Constitutional: He is oriented to person, place, and time and well-developed, well-nourished, and in no distress.  HENT:  Head: Normocephalic and atraumatic.  Pulmonary/Chest: Breath sounds normal.  Abdominal: Soft.  Obese  Neurological: He is alert and oriented to person, place, and time.  Skin: Skin is warm and dry.  Psychiatric: Affect normal.    Lab Results:   Recent Labs  04/01/17 0703 04/02/17 0444  WBC 15.1* 13.9*  HGB 16.1 14.9  HCT 46.8 44.7  PLT 210 205   BMET  Recent Labs  04/01/17 0703 04/02/17 0444  NA 137 139  K 4.6 4.3  CL 104 107  CO2 23 24  GLUCOSE 160* 124*  BUN 35* 31*  CREATININE 1.34* 1.37*  CALCIUM 9.8 8.5*   PT/INR No results for input(s): LABPROT, INR in the last 72 hours. ABG No results for input(s): PHART, HCO3 in the last 72 hours.  Invalid input(s): PCO2, PO2  Studies/Results: Ct Abdomen Pelvis Wo Contrast  Result Date: 04/01/2017 CLINICAL DATA:  History of  cholecystectomy and colectomy. History of kidney stones and Crohn's disease. Abdominal distention. EXAM: CT ABDOMEN AND PELVIS WITHOUT CONTRAST TECHNIQUE: Multidetector CT imaging of the abdomen and pelvis was performed following the standard protocol without IV contrast. COMPARISON:  09/26/2014 FINDINGS: Lower chest: Diffuse coronary artery calcifications. Mild cardiomegaly. Lung bases clear. No effusions. Hepatobiliary: No focal liver abnormality is seen. Status post cholecystectomy. No biliary dilatation. Pancreas: No focal abnormality or ductal dilatation. Spleen: No focal abnormality.  Normal size. Adrenals/Urinary Tract: Severe right hydronephrosis due to a 9 mm proximal right ureteral stone. Distal left ureteral stone also noted measuring 4 mm without hydronephrosis. Multiple nonobstructing left mid and lower pole stones. Diffuse cortical thinning within the kidneys. 5.5 cm low-density lesion off the anterior midpole of the right kidney, likely cyst. Stomach/Bowel: Prior partial scratched at prior right hemicolectomy. Small bowel loops are mildly prominent diffusely. No transition point. Scattered sigmoid diverticula. Stomach is grossly unremarkable. Vascular/Lymphatic: Aortic and iliac calcifications. No evidence of aneurysm or adenopathy. Reproductive:  No visible focal abnormality. Other: No free fluid or free air. Small bilateral inguinal hernias containing fat. Musculoskeletal: No acute bony abnormality. IMPRESSION: Severe right hydronephrosis with 9 mm proximal right ureteral stone. Left nephrolithiasis. 4 mm distal left ureteral stone without hydronephrosis on the left. Prior right hemicolectomy. Small bowel is diffusely prominent without caliber change. No convincing evidence for obstruction. Aortoiliac atherosclerosis.  Coronary artery disease. Small bilateral inguinal hernias containing fat. Electronically Signed   By: Rolm Baptise M.D.   On: 04/01/2017 09:38     Assessment: s/p  Procedure(s): CYSTOSCOPY WITH STENT PLACEMENT CYSTOSCOPY WITH RETROGRADE PYELOGRAM  Plan: -Appropriate for discharge today  - will arrange for outpatient ureteroscopy in 2-3 weeks, discussed risk and benefits at length today. All questions answered. -Repeat preop urine culture prior to surgery -Discharged home with pain medication, already on bladder antispasmodic    LOS: 1 day    Hollice Espy 04/02/2017

## 2017-04-02 NOTE — Progress Notes (Signed)
Urology Consult Follow Up  Subjective: No fevers overnight. Some discomfort with the stent, urinary frequency. Reports loose stools at a higher frequency than normal. No further vomiting. Pain improved overall.  Anti-infectives: Anti-infectives    Start     Dose/Rate Route Frequency Ordered Stop   04/01/17 1030  piperacillin-tazobactam (ZOSYN) IVPB 3.375 g  Status:  Discontinued     3.375 g 100 mL/hr over 30 Minutes Intravenous  Once 04/01/17 1021 04/01/17 1023   04/01/17 1030  cefTRIAXone (ROCEPHIN) 1 g in dextrose 5 % 50 mL IVPB - Premix     1 g 100 mL/hr over 30 Minutes Intravenous  Once 04/01/17 1023 04/01/17 1116      Current Facility-Administered Medications  Medication Dose Route Frequency Provider Last Rate Last Dose  . acetaminophen (TYLENOL) tablet 650 mg  650 mg Oral Q6H PRN Bettey Costa, MD       Or  . acetaminophen (TYLENOL) suppository 650 mg  650 mg Rectal Q6H PRN Mody, Sital, MD      . allopurinol (ZYLOPRIM) tablet 300 mg  300 mg Oral Daily Mody, Sital, MD   300 mg at 04/02/17 1024  . amLODipine (NORVASC) tablet 2.5 mg  2.5 mg Oral Daily Mody, Sital, MD   2.5 mg at 04/02/17 1026  . enoxaparin (LOVENOX) injection 40 mg  40 mg Subcutaneous Q24H Mody, Sital, MD      . finasteride (PROSCAR) tablet 5 mg  5 mg Oral Daily Mody, Sital, MD   5 mg at 04/02/17 1025  . hydrALAZINE (APRESOLINE) injection 10 mg  10 mg Intravenous Q6H PRN Mody, Sital, MD      . HYDROcodone-acetaminophen (NORCO/VICODIN) 5-325 MG per tablet 1-2 tablet  1-2 tablet Oral Q4H PRN Mody, Sital, MD      . ketorolac (TORADOL) 15 MG/ML injection 15 mg  15 mg Intravenous Q6H PRN Bettey Costa, MD   15 mg at 04/01/17 1844  . losartan (COZAAR) tablet 100 mg  100 mg Oral Daily Mody, Sital, MD   100 mg at 04/02/17 1025  . meloxicam (MOBIC) tablet 15 mg  15 mg Oral Daily Bettey Costa, MD   15 mg at 04/02/17 1025  . metoprolol tartrate (LOPRESSOR) tablet 50 mg  50 mg Oral BID Bettey Costa, MD   50 mg at 04/02/17 1025  .  morphine 2 MG/ML injection 2 mg  2 mg Intravenous Q4H PRN Bettey Costa, MD   2 mg at 04/01/17 1430  . ondansetron (ZOFRAN) tablet 4 mg  4 mg Oral Q6H PRN Bettey Costa, MD       Or  . ondansetron (ZOFRAN) injection 4 mg  4 mg Intravenous Q6H PRN Bettey Costa, MD   4 mg at 04/01/17 1440  . polyethylene glycol (MIRALAX / GLYCOLAX) packet 17 g  17 g Oral Daily PRN Bettey Costa, MD      . traZODone (DESYREL) tablet 150 mg  150 mg Oral QHS Bettey Costa, MD   150 mg at 04/01/17 2230     Objective: Vital signs in last 24 hours: Temp:  [97.7 F (36.5 C)-98.1 F (36.7 C)] 98.1 F (36.7 C) (10/18 1137) Pulse Rate:  [74-93] 74 (10/18 1137) Resp:  [18-19] 18 (10/18 1137) BP: (133-164)/(60-75) 133/60 (10/18 1137) SpO2:  [92 %-98 %] 98 % (10/18 1137)  Intake/Output from previous day: 10/17 0701 - 10/18 0700 In: 3102 [P.O.:240; I.V.:1862; IV Piggyback:1000] Out: 805 [Urine:805] Intake/Output this shift: Total I/O In: 1552 [P.O.:480; I.V.:1072] Out: -    Physical Exam  Constitutional: He is oriented to person, place, and time and well-developed, well-nourished, and in no distress.  HENT:  Head: Normocephalic and atraumatic.  Pulmonary/Chest: Breath sounds normal.  Abdominal: Soft.  Obese  Neurological: He is alert and oriented to person, place, and time.  Skin: Skin is warm and dry.  Psychiatric: Affect normal.    Lab Results:   Recent Labs  04/01/17 0703 04/02/17 0444  WBC 15.1* 13.9*  HGB 16.1 14.9  HCT 46.8 44.7  PLT 210 205   BMET  Recent Labs  04/01/17 0703 04/02/17 0444  NA 137 139  K 4.6 4.3  CL 104 107  CO2 23 24  GLUCOSE 160* 124*  BUN 35* 31*  CREATININE 1.34* 1.37*  CALCIUM 9.8 8.5*   PT/INR No results for input(s): LABPROT, INR in the last 72 hours. ABG No results for input(s): PHART, HCO3 in the last 72 hours.  Invalid input(s): PCO2, PO2  Studies/Results: Ct Abdomen Pelvis Wo Contrast  Result Date: 04/01/2017 CLINICAL DATA:  History of  cholecystectomy and colectomy. History of kidney stones and Crohn's disease. Abdominal distention. EXAM: CT ABDOMEN AND PELVIS WITHOUT CONTRAST TECHNIQUE: Multidetector CT imaging of the abdomen and pelvis was performed following the standard protocol without IV contrast. COMPARISON:  09/26/2014 FINDINGS: Lower chest: Diffuse coronary artery calcifications. Mild cardiomegaly. Lung bases clear. No effusions. Hepatobiliary: No focal liver abnormality is seen. Status post cholecystectomy. No biliary dilatation. Pancreas: No focal abnormality or ductal dilatation. Spleen: No focal abnormality.  Normal size. Adrenals/Urinary Tract: Severe right hydronephrosis due to a 9 mm proximal right ureteral stone. Distal left ureteral stone also noted measuring 4 mm without hydronephrosis. Multiple nonobstructing left mid and lower pole stones. Diffuse cortical thinning within the kidneys. 5.5 cm low-density lesion off the anterior midpole of the right kidney, likely cyst. Stomach/Bowel: Prior partial scratched at prior right hemicolectomy. Small bowel loops are mildly prominent diffusely. No transition point. Scattered sigmoid diverticula. Stomach is grossly unremarkable. Vascular/Lymphatic: Aortic and iliac calcifications. No evidence of aneurysm or adenopathy. Reproductive:  No visible focal abnormality. Other: No free fluid or free air. Small bilateral inguinal hernias containing fat. Musculoskeletal: No acute bony abnormality. IMPRESSION: Severe right hydronephrosis with 9 mm proximal right ureteral stone. Left nephrolithiasis. 4 mm distal left ureteral stone without hydronephrosis on the left. Prior right hemicolectomy. Small bowel is diffusely prominent without caliber change. No convincing evidence for obstruction. Aortoiliac atherosclerosis.  Coronary artery disease. Small bilateral inguinal hernias containing fat. Electronically Signed   By: Rolm Baptise M.D.   On: 04/01/2017 09:38     Assessment: s/p  Procedure(s): CYSTOSCOPY WITH STENT PLACEMENT CYSTOSCOPY WITH RETROGRADE PYELOGRAM  Plan: -Appropriate for discharge today  - will arrange for outpatient ureteroscopy in 2-3 weeks, discussed risk and benefits at length today. All questions answered. -Repeat preop urine culture prior to surgery -Discharged home with pain medication, already on bladder antispasmodic    LOS: 1 day    Hollice Espy 04/02/2017

## 2017-04-03 ENCOUNTER — Telehealth: Payer: Self-pay

## 2017-04-03 DIAGNOSIS — N200 Calculus of kidney: Secondary | ICD-10-CM

## 2017-04-03 NOTE — Telephone Encounter (Signed)
Left pt a message to notify him that his next surgery is scheduled for 04-20-17 and that he would need a pre-op urine cx done a week prior , this was scheduled and lab order was placed. Patient to call back for details

## 2017-04-06 NOTE — Telephone Encounter (Signed)
Spoke with patient and he is aware of surgery date and pre admit phone interview. He is aware to come in on 04/13/17 for a urine culture. Surgery packet has been mailed out to the patient.

## 2017-04-09 ENCOUNTER — Other Ambulatory Visit: Payer: Medicare Other

## 2017-04-09 DIAGNOSIS — N200 Calculus of kidney: Secondary | ICD-10-CM

## 2017-04-09 LAB — URINALYSIS, COMPLETE
Bilirubin, UA: NEGATIVE
GLUCOSE, UA: NEGATIVE
NITRITE UA: NEGATIVE
Specific Gravity, UA: 1.02 (ref 1.005–1.030)
Urobilinogen, Ur: 1 mg/dL (ref 0.2–1.0)
pH, UA: 6 (ref 5.0–7.5)

## 2017-04-09 LAB — MICROSCOPIC EXAMINATION

## 2017-04-13 ENCOUNTER — Other Ambulatory Visit: Payer: Self-pay

## 2017-04-13 ENCOUNTER — Telehealth: Payer: Self-pay

## 2017-04-13 ENCOUNTER — Inpatient Hospital Stay: Admission: RE | Admit: 2017-04-13 | Payer: Medicare Other | Source: Ambulatory Visit

## 2017-04-13 LAB — CULTURE, URINE COMPREHENSIVE

## 2017-04-13 NOTE — Telephone Encounter (Signed)
Patient notified that unfortunately due to Dr. Erlene Quan being sick we would need to reschedule his surgery from today 04-13-17 until 04-20-17. Patient verbalized understanding and is in agreement with this plan

## 2017-04-14 ENCOUNTER — Telehealth: Payer: Self-pay | Admitting: Family Medicine

## 2017-04-14 MED ORDER — CIPROFLOXACIN HCL 500 MG PO TABS: 500.0000 mg | ORAL_TABLET | Freq: Two times a day (BID) | ORAL | 0 refills | Status: DC

## 2017-04-14 NOTE — Telephone Encounter (Signed)
Patient notified and ABX was sent to pharmacy. 

## 2017-04-14 NOTE — Telephone Encounter (Signed)
-----   Message from Hollice Espy, MD sent at 04/14/2017  8:56 AM EDT ----- PReop urine culture positive.  Please treat with cipro 500 mg tid x 7 days.    Hollice Espy

## 2017-04-20 ENCOUNTER — Ambulatory Visit
Admission: RE | Admit: 2017-04-20 | Discharge: 2017-04-20 | Disposition: A | Discharge status: Home / Self Care | Payer: Medicare Other | Source: Ambulatory Visit | Attending: Urology | Admitting: Urology

## 2017-04-20 ENCOUNTER — Ambulatory Visit: Payer: Medicare Other | Admitting: Anesthesiology

## 2017-04-20 ENCOUNTER — Encounter
Admission: RE | Disposition: A | Discharge status: Home / Self Care | Payer: Self-pay | Source: Ambulatory Visit | Attending: Urology

## 2017-04-20 ENCOUNTER — Other Ambulatory Visit: Payer: Self-pay | Admitting: Urology

## 2017-04-20 DIAGNOSIS — Z79899 Other long term (current) drug therapy: Secondary | ICD-10-CM | POA: Insufficient documentation

## 2017-04-20 DIAGNOSIS — M1990 Unspecified osteoarthritis, unspecified site: Secondary | ICD-10-CM | POA: Insufficient documentation

## 2017-04-20 DIAGNOSIS — N201 Calculus of ureter: Secondary | ICD-10-CM | POA: Diagnosis not present

## 2017-04-20 DIAGNOSIS — N200 Calculus of kidney: Secondary | ICD-10-CM

## 2017-04-20 HISTORY — PX: CYSTOSCOPY/URETEROSCOPY/HOLMIUM LASER/STENT PLACEMENT: SHX6546

## 2017-04-20 LAB — URINALYSIS, ROUTINE W REFLEX MICROSCOPIC
BACTERIA UA: NONE SEEN
BILIRUBIN URINE: NEGATIVE
Glucose, UA: NEGATIVE mg/dL
KETONES UR: NEGATIVE mg/dL
Nitrite: NEGATIVE
PROTEIN: 100 mg/dL — AB
SPECIFIC GRAVITY, URINE: 1.017 (ref 1.005–1.030)
pH: 6 (ref 5.0–8.0)

## 2017-04-20 SURGERY — CYSTOSCOPY/URETEROSCOPY/HOLMIUM LASER/STENT PLACEMENT
Anesthesia: Choice | Site: Ureter | Laterality: Bilateral | Wound class: Clean Contaminated

## 2017-04-20 MED ORDER — PROPOFOL 10 MG/ML IV BOLUS
INTRAVENOUS | Status: DC | PRN
  Administered 2017-04-20: 180 mg via INTRAVENOUS
  Administered 2017-04-20: 20 mg via INTRAVENOUS

## 2017-04-20 MED ORDER — OXYBUTYNIN CHLORIDE 5 MG PO TABS: 5.0000 mg | ORAL_TABLET | Freq: Three times a day (TID) | ORAL | 0 refills | Status: DC | PRN

## 2017-04-20 MED ORDER — LACTATED RINGERS IV SOLN
INTRAVENOUS | Status: DC
  Administered 2017-04-20: 10:00:00 via INTRAVENOUS

## 2017-04-20 MED ORDER — OXYBUTYNIN CHLORIDE 5 MG PO TABS
ORAL_TABLET | ORAL | Status: AC
  Filled 2017-04-20: qty 1

## 2017-04-20 MED ORDER — ROCURONIUM BROMIDE 100 MG/10ML IV SOLN
INTRAVENOUS | Status: DC | PRN
  Administered 2017-04-20: 10 mg via INTRAVENOUS
  Administered 2017-04-20: 40 mg via INTRAVENOUS

## 2017-04-20 MED ORDER — FENTANYL CITRATE (PF) 100 MCG/2ML IJ SOLN
INTRAMUSCULAR | Status: AC
  Filled 2017-04-20: qty 2

## 2017-04-20 MED ORDER — SUGAMMADEX SODIUM 200 MG/2ML IV SOLN
INTRAVENOUS | Status: DC | PRN
  Administered 2017-04-20: 221.4 mg via INTRAVENOUS

## 2017-04-20 MED ORDER — LIDOCAINE HCL (CARDIAC) 20 MG/ML IV SOLN
INTRAVENOUS | Status: DC | PRN
  Administered 2017-04-20: 100 mg via INTRAVENOUS

## 2017-04-20 MED ORDER — SUCCINYLCHOLINE CHLORIDE 20 MG/ML IJ SOLN
INTRAMUSCULAR | Status: DC | PRN
  Administered 2017-04-20: 100 mg via INTRAVENOUS

## 2017-04-20 MED ORDER — OXYBUTYNIN CHLORIDE 5 MG PO TABS
5.0000 mg | ORAL_TABLET | Freq: Three times a day (TID) | ORAL | Status: DC | PRN
  Administered 2017-04-20: 5 mg via ORAL
  Filled 2017-04-20 (×2): qty 1

## 2017-04-20 MED ORDER — SUGAMMADEX SODIUM 500 MG/5ML IV SOLN
INTRAVENOUS | Status: AC
Start: 2017-04-20 — End: 2017-04-20
  Filled 2017-04-20: qty 5

## 2017-04-20 MED ORDER — ONDANSETRON HCL 4 MG/2ML IJ SOLN
INTRAMUSCULAR | Status: AC
  Filled 2017-04-20: qty 2

## 2017-04-20 MED ORDER — MIDAZOLAM HCL 2 MG/2ML IJ SOLN
INTRAMUSCULAR | Status: AC
  Filled 2017-04-20: qty 2

## 2017-04-20 MED ORDER — FAMOTIDINE 20 MG PO TABS
20.0000 mg | ORAL_TABLET | Freq: Once | ORAL | Status: AC
  Administered 2017-04-20: 20 mg via ORAL

## 2017-04-20 MED ORDER — ACETAMINOPHEN 10 MG/ML IV SOLN
INTRAVENOUS | Status: AC
  Filled 2017-04-20: qty 100

## 2017-04-20 MED ORDER — HYDROCODONE-ACETAMINOPHEN 5-325 MG PO TABS: 1.0000 | ORAL_TABLET | Freq: Four times a day (QID) | ORAL | 0 refills | Status: DC | PRN

## 2017-04-20 MED ORDER — CEFAZOLIN SODIUM-DEXTROSE 2-4 GM/100ML-% IV SOLN
2.0000 g | Freq: Once | INTRAVENOUS | Status: AC
  Administered 2017-04-20: 2 g via INTRAVENOUS

## 2017-04-20 MED ORDER — DEXAMETHASONE SODIUM PHOSPHATE 10 MG/ML IJ SOLN
INTRAMUSCULAR | Status: DC | PRN
  Administered 2017-04-20: 5 mg via INTRAVENOUS

## 2017-04-20 MED ORDER — CEFAZOLIN SODIUM-DEXTROSE 2-4 GM/100ML-% IV SOLN
INTRAVENOUS | Status: AC
  Filled 2017-04-20: qty 100

## 2017-04-20 MED ORDER — LIDOCAINE HCL (PF) 2 % IJ SOLN
INTRAMUSCULAR | Status: AC
  Filled 2017-04-20: qty 10

## 2017-04-20 MED ORDER — IOTHALAMATE MEGLUMINE 43 % IV SOLN
INTRAVENOUS | Status: DC | PRN
  Administered 2017-04-20: 15 mL via URETHRAL

## 2017-04-20 MED ORDER — DEXAMETHASONE SODIUM PHOSPHATE 10 MG/ML IJ SOLN
INTRAMUSCULAR | Status: AC
  Filled 2017-04-20: qty 1

## 2017-04-20 MED ORDER — PROPOFOL 10 MG/ML IV BOLUS
INTRAVENOUS | Status: AC
  Filled 2017-04-20: qty 20

## 2017-04-20 MED ORDER — ONDANSETRON HCL 4 MG/2ML IJ SOLN: 4.0000 mg | Freq: Once | INTRAMUSCULAR | Status: DC | PRN

## 2017-04-20 MED ORDER — ACETAMINOPHEN 10 MG/ML IV SOLN
INTRAVENOUS | Status: DC | PRN
  Administered 2017-04-20: 1000 mg via INTRAVENOUS

## 2017-04-20 MED ORDER — FENTANYL CITRATE (PF) 100 MCG/2ML IJ SOLN
INTRAMUSCULAR | Status: DC | PRN
  Administered 2017-04-20 (×6): 50 ug via INTRAVENOUS

## 2017-04-20 MED ORDER — FENTANYL CITRATE (PF) 100 MCG/2ML IJ SOLN: 25.0000 ug | INTRAMUSCULAR | Status: DC | PRN

## 2017-04-20 MED ORDER — ONDANSETRON HCL 4 MG/2ML IJ SOLN
INTRAMUSCULAR | Status: DC | PRN
  Administered 2017-04-20: 4 mg via INTRAVENOUS

## 2017-04-20 MED ORDER — MIDAZOLAM HCL 2 MG/2ML IJ SOLN
INTRAMUSCULAR | Status: DC | PRN
  Administered 2017-04-20: 2 mg via INTRAVENOUS

## 2017-04-20 MED ORDER — FAMOTIDINE 20 MG PO TABS
ORAL_TABLET | ORAL | Status: AC
  Administered 2017-04-20: 20 mg via ORAL
  Filled 2017-04-20: qty 1

## 2017-04-20 MED ORDER — TAMSULOSIN HCL 0.4 MG PO CAPS: 0.4000 mg | ORAL_CAPSULE | Freq: Every day | ORAL | 0 refills | Status: DC

## 2017-04-20 SURGICAL SUPPLY — 31 items
BAG DRAIN CYSTO-URO LG1000N (MISCELLANEOUS) ×3 IMPLANT
BASKET ZERO TIP 1.9FR (BASKET) ×3 IMPLANT
BRUSH SCRUB EZ 1% IODOPHOR (MISCELLANEOUS) ×3 IMPLANT
CATH URETL 5X70 OPEN END (CATHETERS) ×3 IMPLANT
CNTNR SPEC 2.5X3XGRAD LEK (MISCELLANEOUS) ×1
CONRAY 43 FOR UROLOGY 50M (MISCELLANEOUS) ×3 IMPLANT
CONT SPEC 4OZ STER OR WHT (MISCELLANEOUS) ×2
CONTAINER SPEC 2.5X3XGRAD LEK (MISCELLANEOUS) ×1 IMPLANT
DRAPE UTILITY 15X26 TOWEL STRL (DRAPES) ×3 IMPLANT
FIBER LASER LITHO 273 (Laser) ×3 IMPLANT
GLIDEWIRE STIFF .35X180X3 HYDR (WIRE) ×3 IMPLANT
GLOVE BIO SURGEON STRL SZ 6.5 (GLOVE) ×2 IMPLANT
GLOVE BIO SURGEONS STRL SZ 6.5 (GLOVE) ×1
GOWN STRL REUS W/ TWL LRG LVL3 (GOWN DISPOSABLE) ×2 IMPLANT
GOWN STRL REUS W/TWL LRG LVL3 (GOWN DISPOSABLE) ×4
GUIDEWIRE GREEN .038 145CM (MISCELLANEOUS) ×3 IMPLANT
INFUSOR MANOMETER BAG 3000ML (MISCELLANEOUS) ×3 IMPLANT
INTRODUCER DILATOR DOUBLE (INTRODUCER) ×3 IMPLANT
KIT RM TURNOVER CYSTO AR (KITS) ×3 IMPLANT
PACK CYSTO AR (MISCELLANEOUS) ×3 IMPLANT
SENSORWIRE 0.038 NOT ANGLED (WIRE) ×6
SET CYSTO W/LG BORE CLAMP LF (SET/KITS/TRAYS/PACK) ×3 IMPLANT
SHEATH URETERAL 12FR 45CM (SHEATH) ×3 IMPLANT
SHEATH URETERAL 12FRX35CM (MISCELLANEOUS) IMPLANT
SOL .9 NS 3000ML IRR  AL (IV SOLUTION) ×2
SOL .9 NS 3000ML IRR UROMATIC (IV SOLUTION) ×1 IMPLANT
STENT URET 6FRX24 CONTOUR (STENTS) IMPLANT
STENT URET 6FRX26 CONTOUR (STENTS) ×6 IMPLANT
SURGILUBE 2OZ TUBE FLIPTOP (MISCELLANEOUS) ×3 IMPLANT
WATER STERILE IRR 1000ML POUR (IV SOLUTION) ×3 IMPLANT
WIRE SENSOR 0.038 NOT ANGLED (WIRE) ×2 IMPLANT

## 2017-04-20 NOTE — OR Nursing (Signed)
Patient had dose of beta blocker 04/19/17 @ 2000, Dr Marcello Moores aware no new orders received.  Dr Erlene Quan reviewed labs.

## 2017-04-20 NOTE — Transfer of Care (Deleted)
Immediate Anesthesia Transfer of Care Note  Patient: Isaiah Walker  Procedure(s) Performed: CYSTOSCOPY/URETEROSCOPY/HOLMIUM LASER/STENT EXCHANGE (Bilateral Ureter)  Patient Location: PACU  Anesthesia Type:General  Level of Consciousness: awake, alert  and oriented  Airway & Oxygen Therapy: Patient Spontanous Breathing and Patient connected to face mask oxygen  Post-op Assessment: Report given to RN and Post -op Vital signs reviewed and stable  Post vital signs: Reviewed and stable  Last Vitals:  Vitals:   04/20/17 0848  BP: (!) 151/76  Pulse: 84  Resp: 18  Temp: 36.4 C  SpO2: 98%    Last Pain:  Vitals:   04/20/17 0848  TempSrc: Temporal  PainSc: 2          Complications: No apparent anesthesia complications

## 2017-04-20 NOTE — Anesthesia Post-op Follow-up Note (Signed)
Anesthesia QCDR form completed.        

## 2017-04-20 NOTE — Op Note (Signed)
Date of procedure: 04/20/17  Preoperative diagnosis:  1. Bilateral ureteral calculi 2. History of urosepsis  Postoperative diagnosis:  1. Same as above  Procedure: 1. Bilateral ureteroscopy with laser lithotripsy 2. bilateral ureteral stent exchange 3. Bilateral retrograde pyelogram 4. Basket extraction of stone fragment  Surgeon: Hollice Espy, MD  Anesthesia: General  Complications: None  Intraoperative findings: Severely impacted 9 mm left proximal ureteral calculus, all fragments removed.  6 mm nonobstructing left distal ureteral stone also treated.  EBL: Minimal  Specimens: Stone fragment  Drains: 6 x 26 double-J ureteral stent bilaterally  Indication: Isaiah Walker is a 71 y.o. patient with bilateral ureteral calculi who underwent previous ureteral stenting who returns today for definitive management of the stone.  In the interim, he has been adequately treated with appropriate antibiotics which he continues today.  After reviewing the management options for treatment, he elected to proceed with the above surgical procedure(s). We have discussed the potential benefits and risks of the procedure, side effects of the proposed treatment, the likelihood of the patient achieving the goals of the procedure, and any potential problems that might occur during the procedure or recuperation. Informed consent has been obtained.  Description of procedure:  The patient was taken to the operating room and general anesthesia was induced.  The patient was placed in the dorsal lithotomy position, prepped and draped in the usual sterile fashion, and preoperative antibiotics were administered. A preoperative time-out was performed.   A 21 French rigid cystoscope was advanced per urethra into the bladder.  Attention was turned to the left ureteral orifice for which ureteral stent was seen emanating.  The distal coil of the stent was grasped and brought to the level of the urethral meatus.   The stent was then cannulated using a sensor wire up to the level of the urethral meatus.  The wire was left in place as a safety wire.  At this point time, a 4.5 French semirigid ureteroscope was advanced to the distal ureter where stone was encountered.  A 270 m laser fiber was then used using the settings of 0.8 J and 10 Hz to fragment the stone into smaller pieces.  Each of these pieces were then extracted using 1.9 [place] tipless nitinol basket.  The scope was then advanced into the mid ureter and no additional stone fragments was identified.  A retrograde pyelogram was performed on the side which showed no obvious filling defects or hydronephrosis.  Attention was then turned to the right ureteral orifice from which a ureteral stent was seen emanating.  The distal coil of the stent was grasped and brought to level urethral meatus.  This was then cannulated using a wire as well.  Stone could be seen within the proximal ureter.  Unfortunately, the stent, when withdrawn, was pulled down below the level of the stone and the sensor wire did not go easily.  Ultimately, I was successful in getting a angle Glidewire beyond the stone.  A 5 French open-ended ureteral catheter was then advanced beyond the level of the stone and the wire was exchanged for a sensor wire.  A dual lumen catheter was then used to introduce a Super Stiff wire just distal to the stone.  A 35 cm ureteral access sheath was then advanced to an area just distal to the stone over the Super Stiff wire.  The inner lumen was removed.  A dual-lumen 8 [place] Wolf flexible ureteroscope was then advanced up to the stone.  Of note,  there was fairly significant ureteral edema and the stone appeared to be heavily impacted partially embedded into the wall of the ureter.  At this point in time, using the settings as previously mentioned, the stone was fragmented and freed from the wall of the ureter.  A 1.9 French tipless basket was used to extract the  majority of the stone fragment.  Finally, the largest stone fragment broke free from the ureter and retropulsed into the renal pelvis.  The wire was then replaced and the access sheath was advanced to the level of just distal to the UPJ and the residual stone burden was fragmented, basketed, and removed.  Final retrograde pyelogram created roadmap of the kidney.  The kidney still had a hydronephrotic appearance without obvious filling defects.  Roadmap was created to ensure that each and every calyx was directly visualized.  Once no significant residual stone burden was identified, the scope was backed down the length of the ureter removing the access sheath along the way.  The area of the proximal ureter with its stone had been dislodged appeared to be intact, there is no extravasation, fairly significant edema.  There is no obvious residual stone fragment at this level.  The remainder of the ureter appeared unremarkable.  The safety wire was then backloaded over rigid cystoscope and a 6 x 26 French double-J ureteral stent was replaced.  The wire was withdrawn and a full cause noted within the renal pelvis.  Wire was well withdrawn no focal was noted in the bladder.  Finally, the safety wire on the left was used to place a 6 x 26 French double-J ureteral stent on this side as well.  This is done in the aforementioned technique.  Once the stents were in satisfactory position, the bladder was drained and the scope was removed.  Stone was sent for stone analysis.  Patient was then cleaned and dried, repositioned supine position, reversed from anesthesia, taken to PACU in stable condition.  Plan: Patient will return next week for cystoscopy, bilateral ureteral stent removal.   Hollice Espy, M.D.

## 2017-04-20 NOTE — Interval H&P Note (Signed)
History and Physical Interval Note:  04/20/2017 9:53 AM  Isaiah Walker  has presented today for surgery, with the diagnosis of BILATERAL KIDNEY STONES  The various methods of treatment have been discussed with the patient and family. After consideration of risks, benefits and other options for treatment, the patient has consented to  Procedure(s): CYSTOSCOPY/URETEROSCOPY/HOLMIUM LASER/STENT EXCHANGE (Bilateral) as a surgical intervention .  The patient's history has been reviewed, patient examined, no change in status, stable for surgery.  I have reviewed the patient's chart and labs.  Questions were answered to the patient's satisfaction.    RRR CTAB  Hollice Espy

## 2017-04-20 NOTE — Anesthesia Procedure Notes (Signed)
Procedure Name: Intubation Performed by: Demetrius Charity, CRNA Pre-anesthesia Checklist: Patient identified, Patient being monitored, Timeout performed, Emergency Drugs available and Suction available Patient Re-evaluated:Patient Re-evaluated prior to induction Oxygen Delivery Method: Circle system utilized Preoxygenation: Pre-oxygenation with 100% oxygen Induction Type: IV induction Ventilation: Mask ventilation without difficulty Laryngoscope Size: Mac and 4 Grade View: Grade II Tube type: Oral Tube size: 7.0 mm Number of attempts: 1 Airway Equipment and Method: Stylet Placement Confirmation: ETT inserted through vocal cords under direct vision,  positive ETCO2 and breath sounds checked- equal and bilateral Secured at: 22 cm Tube secured with: Tape Dental Injury: Teeth and Oropharynx as per pre-operative assessment

## 2017-04-20 NOTE — Discharge Instructions (Signed)
Cystoscopy, Care After °Refer to this sheet in the next few weeks. These instructions provide you with information about caring for yourself after your procedure. Your health care provider may also give you more specific instructions. Your treatment has been planned according to current medical practices, but problems sometimes occur. Call your health care provider if you have any problems or questions after your procedure. °What can I expect after the procedure? °After the procedure, it is common to have: °· Mild pain when you urinate. Pain should stop within a few minutes after you urinate. This may last for up to 1 week. °· A small amount of blood in your urine for several days. °· Feeling like you need to urinate but producing only a small amount of urine. ° °Follow these instructions at home: ° °Medicines °· Take over-the-counter and prescription medicines only as told by your health care provider. °· If you were prescribed an antibiotic medicine, take it as told by your health care provider. Do not stop taking the antibiotic even if you start to feel better. °General instructions ° °· Return to your normal activities as told by your health care provider. Ask your health care provider what activities are safe for you. °· Do not drive for 24 hours if you received a sedative. °· Watch for any blood in your urine. If the amount of blood in your urine increases, call your health care provider. °· Follow instructions from your health care provider about eating or drinking restrictions. °· If a tissue sample was removed for testing (biopsy) during your procedure, it is your responsibility to get your test results. Ask your health care provider or the department performing the test when your results will be ready. °· Drink enough fluid to keep your urine clear or pale yellow. °· Keep all follow-up visits as told by your health care provider. This is important. °Contact a health care provider if: °· You have pain that  gets worse or does not get better with medicine, especially pain when you urinate. °· You have difficulty urinating. °Get help right away if: °· You have more blood in your urine. °· You have blood clots in your urine. °· You have abdominal pain. °· You have a fever or chills. °· You are unable to urinate. °This information is not intended to replace advice given to you by your health care provider. Make sure you discuss any questions you have with your health care provider. °Document Released: 12/20/2004 Document Revised: 11/08/2015 Document Reviewed: 04/19/2015 °Elsevier Interactive Patient Education © 2017 [employer] ° °AMBULATORY SURGERY  °DISCHARGE INSTRUCTIONS ° ° °1) The drugs that you were given will stay in your system until tomorrow so for the next 24 hours you should not: ° °A) Drive an automobile °B) Make any legal decisions °C) Drink any alcoholic beverage ° ° °2) You may resume regular meals tomorrow.  Today it is better to start with liquids and gradually work up to solid foods. ° °You may eat anything you prefer, but it is better to start with liquids, then soup and crackers, and gradually work up to solid foods. ° ° °3) Please notify your doctor immediately if you have any unusual bleeding, trouble breathing, redness and pain at the surgery site, drainage, fever, or pain not relieved by medication. ° ° ° °4) Additional Instructions: ° ° ° ° ° ° ° °Please contact your physician with any problems or Same Day Surgery at [phone], Monday through Friday 6 am to 4   pm, or [facility] Main number at [phone]. °

## 2017-04-20 NOTE — Anesthesia Preprocedure Evaluation (Signed)
Anesthesia Evaluation  Patient identified by MRN, date of birth, ID band Patient awake    Reviewed: Allergy & Precautions, NPO status , Patient's Chart, lab work & pertinent test results, reviewed documented beta blocker date and time   Airway Mallampati: II  TM Distance: >3 FB     Dental  (+) Chipped   Pulmonary           Cardiovascular Pt. on medications and Pt. on home beta blockers      Neuro/Psych    GI/Hepatic   Endo/Other    Renal/GU Renal disease     Musculoskeletal  (+) Arthritis ,   Abdominal   Peds  Hematology   Anesthesia Other Findings Gout.  Reproductive/Obstetrics                             Anesthesia Physical Anesthesia Plan  ASA: III  Anesthesia Plan:    Post-op Pain Management:    Induction: Intravenous  PONV Risk Score and Plan: 1  Airway Management Planned: Oral ETT  Additional Equipment:   Intra-op Plan:   Post-operative Plan:   Informed Consent: I have reviewed the patients History and Physical, chart, labs and discussed the procedure including the risks, benefits and alternatives for the proposed anesthesia with the patient or authorized representative who has indicated his/her understanding and acceptance.     Plan Discussed with: CRNA  Anesthesia Plan Comments:         Anesthesia Quick Evaluation

## 2017-04-20 NOTE — Anesthesia Postprocedure Evaluation (Signed)
Anesthesia Post Note  Patient: Isaiah Walker  Procedure(s) Performed: CYSTOSCOPY/URETEROSCOPY/HOLMIUM LASER/STENT EXCHANGE (Bilateral Ureter)  Patient location during evaluation: PACU Anesthesia Type: General Level of consciousness: awake and alert Pain management: pain level controlled Vital Signs Assessment: post-procedure vital signs reviewed and stable Respiratory status: spontaneous breathing, nonlabored ventilation, respiratory function stable and patient connected to nasal cannula oxygen Cardiovascular status: blood pressure returned to baseline and stable Postop Assessment: no apparent nausea or vomiting Anesthetic complications: no     Last Vitals:  Vitals:   04/20/17 1225 04/20/17 1234  BP: (!) 153/78 137/69  Pulse: 78 71  Resp: 16 18  Temp: 36.7 C (!) 36.4 C  SpO2: 95% 98%    Last Pain:  Vitals:   04/20/17 1155  TempSrc:   PainSc: [place]

## 2017-04-20 NOTE — Transfer of Care (Signed)
Immediate Anesthesia Transfer of Care Note  Patient: Isaiah Walker  Procedure(s) Performed: CYSTOSCOPY/URETEROSCOPY/HOLMIUM LASER/STENT EXCHANGE (Bilateral Ureter)  Patient Location: PACU  Anesthesia Type:General  Level of Consciousness: awake  Airway & Oxygen Therapy: Patient Spontanous Breathing and Patient connected to face mask oxygen  Post-op Assessment: Report given to RN and Post -op Vital signs reviewed and stable  Post vital signs: Reviewed and stable  Last Vitals:  Vitals:   04/20/17 0848 04/20/17 1155  BP: (!) 151/76 (!) 152/71  Pulse: 84 92  Resp: 18 14  Temp: 36.4 C 36.9 C  SpO2: 98% 97%    Last Pain:  Vitals:   04/20/17 0848  TempSrc: Temporal  PainSc: 2          Complications: No apparent anesthesia complications

## 2017-04-21 LAB — URINE CULTURE: CULTURE: NO GROWTH

## 2017-04-28 LAB — STONE ANALYSIS
CA OXALATE, DIHYDRATE: 35 %
Ca Oxalate,Monohydr.: 60 %
Ca phos cry stone ql IR: 5 %
STONE WEIGHT KSTONE: 33 mg

## 2017-05-01 ENCOUNTER — Ambulatory Visit: Payer: Medicare Other | Admitting: Urology

## 2017-05-01 VITALS — BP 133/78 | HR 51 | Ht 66.0 in | Wt 244.7 lb

## 2017-05-01 DIAGNOSIS — N200 Calculus of kidney: Secondary | ICD-10-CM

## 2017-05-01 LAB — URINALYSIS, COMPLETE
Bilirubin, UA: NEGATIVE
GLUCOSE, UA: NEGATIVE
Ketones, UA: NEGATIVE
NITRITE UA: NEGATIVE
PH UA: 5.5 (ref 5.0–7.5)
Specific Gravity, UA: >1.03 — ABNORMAL HIGH (ref 1.005–1.030)
UUROB: 0.2 mg/dL (ref 0.2–1.0)

## 2017-05-01 LAB — MICROSCOPIC EXAMINATION

## 2017-05-01 MED ORDER — LIDOCAINE HCL 2 % EX GEL
1.0000 "application " | Freq: Once | CUTANEOUS | Status: AC
  Administered 2017-05-01: 1 via URETHRAL

## 2017-05-01 MED ORDER — CIPROFLOXACIN HCL 500 MG PO TABS
500.0000 mg | ORAL_TABLET | Freq: Once | ORAL | Status: AC
  Administered 2017-05-01: 500 mg via ORAL

## 2017-05-01 NOTE — Progress Notes (Signed)
   05/01/17  CC:  Chief Complaint  Patient presents with  . Cysto Stent Removal    HPI: The patient is a 71 year old gentleman who presents today after undergoing bilateral ureteroscopy.  This was done in a staged setting as he initially only had bilateral ureteral stents placed on his first surgery due to the concurrent UTI.  He presents today for stent removal.   He does have a urological history.  Please see note from January 22, 2017 for full details.   Blood pressure 133/78, pulse (!) 51, height 5\' 6"  (1.676 m), weight 244 lb 11.2 oz (111 kg). NED. A&Ox3.   No respiratory distress   Abd soft, NT, ND Normal phallus with bilateral descended testicles  Cystoscopy Procedure Note  Patient identification was confirmed, informed consent was obtained, and patient was prepped using Betadine solution.  Lidocaine jelly was administered per urethral meatus.    Preoperative abx where received prior to procedure.     Pre-Procedure: - Inspection reveals a normal caliber ureteral meatus.  Procedure: The flexible cystoscope was introduced without difficulty - No urethral strictures/lesions are present. -Bilateral ureteral stents removed per meatus intact with flexible graspers   Post-Procedure: - Patient tolerated the procedure well  Assessment/ Plan:  1. Bilateral ureteroscopy Patient will return in 1 month for renal ultrasound to rule out iatrogenic hydronephrosis.  Please see note from January 22, 2017 for full urological history.

## 2017-05-13 ENCOUNTER — Ambulatory Visit
Admission: RE | Admit: 2017-05-13 | Discharge: 2017-05-13 | Disposition: A | Discharge status: Home / Self Care | Payer: Medicare Other | Source: Ambulatory Visit | Attending: Urology | Admitting: Urology

## 2017-05-13 DIAGNOSIS — N200 Calculus of kidney: Secondary | ICD-10-CM | POA: Insufficient documentation

## 2017-05-29 ENCOUNTER — Ambulatory Visit: Payer: Medicare Other | Admitting: Urology

## 2017-05-29 ENCOUNTER — Encounter: Payer: Self-pay | Admitting: Urology

## 2017-05-29 VITALS — BP 136/77 | HR 71 | Ht 66.0 in | Wt 255.1 lb

## 2017-05-29 DIAGNOSIS — N200 Calculus of kidney: Secondary | ICD-10-CM | POA: Diagnosis not present

## 2017-05-29 DIAGNOSIS — R972 Elevated prostate specific antigen [PSA]: Secondary | ICD-10-CM | POA: Diagnosis not present

## 2017-05-29 NOTE — Progress Notes (Signed)
05/29/2017 11:12 AM   Isaiah Walker 13-Sep-1945 [id number]  Referring provider: Danelle Berry, NP [address] [place], [place] [address]  Chief Complaint  Patient presents with  . Follow-up    Renal US results    HPI: The patient is a 71 year old gentleman who presents today for follow-up with renal ultrasound after undergoing staged bilateral ureteroscopy.   1. Nephrolithiasis after undergoing bilateral ureteroscopy.  This was done in a staged setting as he initially only had bilateral ureteral stents placed on his first surgery due to the concurrent UTI.  Post instrumentation renal ultrasound shows no left hydronephrosis.  Showed significant improvement of his right hydronephrosis which was quite significant on his preoperative CT scan.  Bilateral ureteral jets were seen indicating ureteral patency.  He has a h/o left URS following a PCNL in 2016.  Stone analysis was 95% calcium oxalate and 5% calcium phosphate.  2. Urinary frequency Well controlled on ditropan. However this medication was too expensive and he stopped taking it. He has significant urinary frequency during the day with low-volume voids. He finds is very bothersome. He also significant urgency. Well controlled currently.   3. BPH The patient is on finasteride with no current obstructive symptoms.  4. Elevated PSA PSA in August 2018 was 2.4 (adjusted PSA 4.8) . PSA was 2.3 in October 2017 (adjusted PSA 4.6). He had a negative prostate biopsy in June 2015. His PSA at that time was 4.2 (no finasteride at that time). DRE was 30 g and benign in January 2018.    PMH: Past Medical History:  Diagnosis Date  . Arthritis   . BPH (benign prostatic hyperplasia)   . Crohn's disease ([place])   . Hypertension   . Kidney stones     Surgical History: Past Surgical History:  Procedure Laterality Date  . CHOLECYSTECTOMY    . CHOLECYSTECTOMY, LAPAROSCOPIC N/A   . COLON SURGERY     Colectomy 2005  .  CYSTOSCOPY W/ RETROGRADES Bilateral 04/01/2017   Procedure: CYSTOSCOPY WITH RETROGRADE PYELOGRAM;  Surgeon: Hollice Espy, MD;  Location: [facility];  Service: Urology;  Laterality: Bilateral;  . CYSTOSCOPY WITH STENT PLACEMENT Bilateral 04/01/2017   Procedure: CYSTOSCOPY WITH STENT PLACEMENT;  Surgeon: Hollice Espy, MD;  Location: [facility];  Service: Urology;  Laterality: Bilateral;  . CYSTOSCOPY/URETEROSCOPY/HOLMIUM LASER/STENT PLACEMENT Left 10/16/2014   Procedure: CYSTOSCOPY/URETEROSCOPY/HOLMIUM LASER/STENT PLACEMENT;  Surgeon: Hollice Espy, MD;  Location: [facility];  Service: Urology;  Laterality: Left;  . CYSTOSCOPY/URETEROSCOPY/HOLMIUM LASER/STENT PLACEMENT Bilateral 04/20/2017   Procedure: CYSTOSCOPY/URETEROSCOPY/HOLMIUM LASER/STENT EXCHANGE;  Surgeon: Hollice Espy, MD;  Location: [facility];  Service: Urology;  Laterality: Bilateral;  . HERNIA REPAIR    . Percutaneous Left    Nephrolithotomy    Home Medications:  Allergies as of 05/29/2017      Reactions   Ivp Dye [iodinated Diagnostic Agents] Rash, Other (See Comments)   Pt has had CT scan since. Pt states "i'm fine"      Medication List        Accurate as of 05/29/17 11:12 AM. Always use your most recent med list.          allopurinol 300 MG tablet Commonly known as:  ZYLOPRIM Take 300 mg by mouth daily.   amLODipine 2.5 MG tablet Commonly known as:  NORVASC Take 2.5 mg by mouth daily.   finasteride 5 MG tablet Commonly known as:  PROSCAR Take 1 tablet (5 mg total) by mouth daily.   losartan 100 MG tablet Commonly known as:  COZAAR Take 100 mg by mouth daily.   meloxicam 15 MG tablet Commonly known as:  MOBIC Take 15 mg by mouth daily.   metoprolol tartrate 50 MG tablet Commonly known as:  LOPRESSOR Take 50 mg by mouth 2 (two) times daily.   tamsulosin 0.4 MG Caps capsule Commonly known as:  FLOMAX TAKE 1 CAPSULE BY MOUTH EVERY DAY   traZODone 150 MG tablet Commonly known as:  DESYREL Take 150 mg  by mouth at bedtime.   VESICARE 5 MG tablet Generic drug:  solifenacin TK 1 T PO D       Allergies:  Allergies  Allergen Reactions  . Ivp Dye [Iodinated Diagnostic Agents] Rash and Other (See Comments)    Pt has had CT scan since. Pt states "i'm fine"    Family History: History reviewed. No pertinent family history.  Social History:  reports that  has never smoked. he has never used smokeless tobacco. He reports that he does not drink alcohol or use drugs.  ROS: UROLOGY Frequent Urination?: No Hard to postpone urination?: No Burning/pain with urination?: No Get up at night to urinate?: No Leakage of urine?: No Urine stream starts and stops?: No Trouble starting stream?: No Do you have to strain to urinate?: No Blood in urine?: No Urinary tract infection?: No Sexually transmitted disease?: No Injury to kidneys or bladder?: No Painful intercourse?: No Weak stream?: No Erection problems?: No Penile pain?: No  Gastrointestinal Nausea?: No Vomiting?: No Indigestion/heartburn?: No Diarrhea?: No Constipation?: No  Constitutional Fever: No Night sweats?: No Weight loss?: No Fatigue?: No  Skin Skin rash/lesions?: No Itching?: No  Eyes Blurred vision?: No Double vision?: No  Ears/Nose/Throat Sore throat?: No Sinus problems?: No  Hematologic/Lymphatic Swollen glands?: No Easy bruising?: No  Cardiovascular Leg swelling?: No Chest pain?: No  Respiratory Cough?: No Shortness of breath?: No  Endocrine Excessive thirst?: No  Musculoskeletal Back pain?: No Joint pain?: No  Neurological Headaches?: No Dizziness?: No  Psychologic Depression?: No Anxiety?: No  Physical Exam: BP 136/77 (BP Location: Right Arm, Patient Position: Sitting, Cuff Size: Normal)   Pulse 71   Ht 5\' 6"  (1.676 m)   Wt 255 lb 1.6 oz (115.7 kg)   BMI 41.17 kg/m   Constitutional:  Alert and oriented, No acute distress. HEENT: [place] AT, moist mucus membranes.  Trachea  midline, no masses. Cardiovascular: No clubbing, cyanosis, or edema. Respiratory: Normal respiratory effort, no increased work of breathing. GI: Abdomen is soft, nontender, nondistended, no abdominal masses GU: No CVA tenderness.  Skin: No rashes, bruises or suspicious lesions. Lymph: No cervical or inguinal adenopathy. Neurologic: Grossly intact, no focal deficits, moving all 4 extremities. Psychiatric: Normal mood and affect.  Laboratory Data: Lab Results  Component Value Date   WBC 13.9 (H) 04/02/2017   HGB 14.9 04/02/2017   HCT 44.7 04/02/2017   MCV 90.3 04/02/2017   PLT 205 04/02/2017    Lab Results  Component Value Date   CREATININE 1.37 (H) 04/02/2017    No results found for: PSA  No results found for: TESTOSTERONE  No results found for: HGBA1C  Urinalysis    Component Value Date/Time   COLORURINE YELLOW (A) 04/20/2017 0831   APPEARANCEUR Cloudy (A) 05/01/2017 0937   LABSPEC 1.017 04/20/2017 0831   PHURINE 6.0 04/20/2017 0831   GLUCOSEU Negative 05/01/2017 0937   HGBUR LARGE (A) 04/20/2017 0831   BILIRUBINUR Negative 05/01/2017 [place] 04/20/2017 0831   PROTEINUR 3+ (A) 05/01/2017 6712  PROTEINUR 100 (A) 04/20/2017 0831   NITRITE Negative 05/01/2017 0937   NITRITE NEGATIVE 04/20/2017 0831   LEUKOCYTESUR 1+ (A) 05/01/2017 0937    Pertinent Imaging: Renal ultrasound reviewed as above  Assessment & Plan:    1. Urinary frequency Continue  Ditropan 5 mg 2-3 times per day.  2. BPH Continue finasteride  3. Elevated PSA PSA is relatively stable though very slight trend from 4.2 to 4.8 over the last 3 years since his last biopsy. We'll continue to check this every 6 months.  He will be due in 3 months for this.   4. Nephrolithiasis We went over ways to prevent future stone formation.  We discussed the stone analysis.  We went over the ABCs of stone formation booklet.  He was given a copy of this.   Return in about 3 months  (around 08/27/2017) for PSA prior.  Nickie Retort, MD  [facility] [address], [place], [place] [address] [phone]

## 2017-07-17 ENCOUNTER — Other Ambulatory Visit: Payer: Medicare Other

## 2017-07-17 DIAGNOSIS — R972 Elevated prostate specific antigen [PSA]: Secondary | ICD-10-CM

## 2017-07-18 LAB — PSA: PROSTATE SPECIFIC AG, SERUM: 1.9 ng/mL (ref 0.0–4.0)

## 2017-07-21 ENCOUNTER — Other Ambulatory Visit: Payer: Medicare Other

## 2017-07-23 ENCOUNTER — Encounter: Payer: Self-pay | Admitting: Urology

## 2017-07-23 ENCOUNTER — Ambulatory Visit: Payer: Medicare Other | Admitting: Urology

## 2017-07-23 VITALS — BP 155/76 | HR 62 | Ht 66.0 in | Wt 255.4 lb

## 2017-07-23 DIAGNOSIS — R972 Elevated prostate specific antigen [PSA]: Secondary | ICD-10-CM

## 2017-07-23 NOTE — Progress Notes (Signed)
07/23/2017 10:05 AM   Maria Nat Christen 1945-09-06 [id number]  Referring provider: [name], MD [place], [place] [address]  Chief Complaint  Patient presents with  . Elevated PSA    HPI: The patient is a 72 year old gentleman who presents today for follow-up of his PSA.   1. Nephrolithiasis Patient has history of bilateral ureteroscopy in November 2018. This was done in a staged setting as he initially only had bilateral ureteral stents placed on his first surgery due to the concurrent UTI.  Post instrumentation renal ultrasound showed no left hydronephrosis. It also showed significant improvement of his right hydronephrosis which was quite significant on his preoperative CT scan.  Bilateral ureteral jets were seen indicating ureteral patency.  He has a h/o left URS following a PCNLin 2016.  Stone analysis was 95% calcium oxalate and 5% calcium phosphate.  2. Urinary frequency Well controlled on ditropan. He has significant urinary frequency during the day with low-volume voids at baseline. He finds this very bothersome. He also significant urgency. Well controlled currently.   3. BPH The patient is on finasteride with no current obstructive symptoms.  4. Elevated PSA PSA in August 2018 was 2.4 (adjusted PSA 4.8) . PSAwas2.3in October 2017 (adjusted PSA 4.6). He had a negative prostate biopsy in June 2015. His PSA at that time was 4.2(no finasteride at that time). DRE was 30 g and benign in January 2018.   PSA fell to 1.9 (adjusted 4.8) in February 2019.       PMH: Past Medical History:  Diagnosis Date  . Arthritis   . BPH (benign prostatic hyperplasia)   . Crohn's disease ([place])   . Hypertension   . Kidney stones     Surgical History: Past Surgical History:  Procedure Laterality Date  . CHOLECYSTECTOMY    . CHOLECYSTECTOMY, LAPAROSCOPIC N/A   . COLON SURGERY     Colectomy 2005  . CYSTOSCOPY W/ RETROGRADES Bilateral 04/01/2017   Procedure: CYSTOSCOPY WITH RETROGRADE PYELOGRAM;  Surgeon: Hollice Espy, MD;  Location: [facility];  Service: Urology;  Laterality: Bilateral;  . CYSTOSCOPY WITH STENT PLACEMENT Bilateral 04/01/2017   Procedure: CYSTOSCOPY WITH STENT PLACEMENT;  Surgeon: Hollice Espy, MD;  Location: [facility];  Service: Urology;  Laterality: Bilateral;  . CYSTOSCOPY/URETEROSCOPY/HOLMIUM LASER/STENT PLACEMENT Left 10/16/2014   Procedure: CYSTOSCOPY/URETEROSCOPY/HOLMIUM LASER/STENT PLACEMENT;  Surgeon: Hollice Espy, MD;  Location: [facility];  Service: Urology;  Laterality: Left;  . CYSTOSCOPY/URETEROSCOPY/HOLMIUM LASER/STENT PLACEMENT Bilateral 04/20/2017   Procedure: CYSTOSCOPY/URETEROSCOPY/HOLMIUM LASER/STENT EXCHANGE;  Surgeon: Hollice Espy, MD;  Location: [facility];  Service: Urology;  Laterality: Bilateral;  . HERNIA REPAIR    . Percutaneous Left    Nephrolithotomy    Home Medications:  Allergies as of 07/23/2017      Reactions   Ivp Dye [iodinated Diagnostic Agents] Rash, Other (See Comments)   Pt has had CT scan since. Pt states "i'm fine"      Medication List        Accurate as of 07/23/17 10:05 AM. Always use your most recent med list.          allopurinol 300 MG tablet Commonly known as:  ZYLOPRIM Take 300 mg by mouth daily.   amLODipine 2.5 MG tablet Commonly known as:  NORVASC Take 2.5 mg by mouth daily.   finasteride 5 MG tablet Commonly known as:  PROSCAR Take 1 tablet (5 mg total) by mouth daily.   losartan 100 MG tablet Commonly known as:  COZAAR Take 100 mg by mouth  daily.   meloxicam 15 MG tablet Commonly known as:  MOBIC Take 15 mg by mouth daily.   metoprolol tartrate 50 MG tablet Commonly known as:  LOPRESSOR Take 50 mg by mouth 2 (two) times daily.   Phendimetrazine Tartrate 105 MG Cp24 TK ONE C PO  ONCE D IN THE MORNING. NO CAFFEINE.   tamsulosin 0.4 MG Caps capsule Commonly known as:  FLOMAX TAKE 1 CAPSULE BY MOUTH EVERY DAY   traZODone 150 MG  tablet Commonly known as:  DESYREL Take 150 mg by mouth at bedtime.   VESICARE 5 MG tablet Generic drug:  solifenacin TK 1 T PO D       Allergies:  Allergies  Allergen Reactions  . Ivp Dye [Iodinated Diagnostic Agents] Rash and Other (See Comments)    Pt has had CT scan since. Pt states "i'm fine"    Family History: History reviewed. No pertinent family history.  Social History:  reports that  has never smoked. he has never used smokeless tobacco. He reports that he does not drink alcohol or use drugs.  ROS: UROLOGY Frequent Urination?: No Hard to postpone urination?: No Burning/pain with urination?: No Get up at night to urinate?: No Leakage of urine?: No Urine stream starts and stops?: No Trouble starting stream?: No Do you have to strain to urinate?: No Blood in urine?: No Urinary tract infection?: No Sexually transmitted disease?: No Injury to kidneys or bladder?: No Painful intercourse?: No Weak stream?: No Erection problems?: No Penile pain?: No  Gastrointestinal Nausea?: No Vomiting?: No Indigestion/heartburn?: No Diarrhea?: No Constipation?: No  Constitutional Fever: No Night sweats?: No Weight loss?: No Fatigue?: No  Skin Skin rash/lesions?: No Itching?: No  Eyes Blurred vision?: No Double vision?: No  Ears/Nose/Throat Sore throat?: No Sinus problems?: No  Hematologic/Lymphatic Swollen glands?: No Easy bruising?: No  Cardiovascular Leg swelling?: No Chest pain?: No  Respiratory Cough?: No Shortness of breath?: No  Endocrine Excessive thirst?: No  Musculoskeletal Back pain?: No Joint pain?: No  Neurological Headaches?: No Dizziness?: No  Psychologic Depression?: No Anxiety?: No  Physical Exam: BP (!) 155/76 (BP Location: Right Arm, Patient Position: Sitting, Cuff Size: Large)   Pulse 62   Ht 5\' 6"  (1.676 m)   Wt 255 lb 6.4 oz (115.8 kg)   BMI 41.22 kg/m   Constitutional:  Alert and oriented, No acute  distress. HEENT: [place] AT, moist mucus membranes.  Trachea midline, no masses. Cardiovascular: No clubbing, cyanosis, or edema. Respiratory: Normal respiratory effort, no increased work of breathing. GI: Abdomen is soft, nontender, nondistended, no abdominal masses GU: No CVA tenderness. Patient deferred DRE Skin: No rashes, bruises or suspicious lesions. Lymph: No cervical or inguinal adenopathy. Neurologic: Grossly intact, no focal deficits, moving all 4 extremities. Psychiatric: Normal mood and affect.  Laboratory Data: Lab Results  Component Value Date   WBC 13.9 (H) 04/02/2017   HGB 14.9 04/02/2017   HCT 44.7 04/02/2017   MCV 90.3 04/02/2017   PLT 205 04/02/2017    Lab Results  Component Value Date   CREATININE 1.37 (H) 04/02/2017    No results found for: PSA  No results found for: TESTOSTERONE  No results found for: HGBA1C  Urinalysis    Component Value Date/Time   COLORURINE YELLOW (A) 04/20/2017 0831   APPEARANCEUR Cloudy (A) 05/01/2017 0937   LABSPEC 1.017 04/20/2017 0831   PHURINE 6.0 04/20/2017 0831   GLUCOSEU Negative 05/01/2017 0937   HGBUR LARGE (A) 04/20/2017 0831   BILIRUBINUR Negative 05/01/2017  [place] 04/20/2017 0831   PROTEINUR 3+ (A) 05/01/2017 0937   PROTEINUR 100 (A) 04/20/2017 0831   NITRITE Negative 05/01/2017 0937   NITRITE NEGATIVE 04/20/2017 0831   LEUKOCYTESUR 1+ (A) 05/01/2017 0937    Assessment & Plan:    1. Urinary frequency Continue  Ditropan 5 mg 2-3 times per day.  2. BPH Continue finasteride  3. Elevated PSA PSA has since decreased to a normal range even when adjusted for finasteride.  We will check one more time in 6 months.  If this is normal, he can return to annual screening.  Patient deferred DRE today.  He will need DRE at his next visit.  4. History of Nephrolithiasis No current stone burden   Return in about 6 months (around 01/20/2018) for PSA prior.  Nickie Retort, MD  [facility] [address], [place], [place] [address] [phone]

## 2018-01-19 ENCOUNTER — Other Ambulatory Visit: Payer: Medicare Other

## 2018-01-21 ENCOUNTER — Ambulatory Visit: Payer: Medicare Other

## 2018-01-26 ENCOUNTER — Other Ambulatory Visit: Payer: Medicare HMO

## 2018-01-26 DIAGNOSIS — R972 Elevated prostate specific antigen [PSA]: Secondary | ICD-10-CM

## 2018-01-27 LAB — PSA: Prostate Specific Ag, Serum: 1.5 ng/mL (ref 0.0–4.0)

## 2018-02-02 ENCOUNTER — Encounter: Payer: Self-pay | Admitting: Urology

## 2018-02-02 ENCOUNTER — Ambulatory Visit: Payer: Medicare HMO | Admitting: Urology

## 2018-02-02 VITALS — BP 160/92 | HR 60 | Ht 66.0 in | Wt 259.6 lb

## 2018-02-02 DIAGNOSIS — Z125 Encounter for screening for malignant neoplasm of prostate: Secondary | ICD-10-CM

## 2018-02-02 DIAGNOSIS — N209 Urinary calculus, unspecified: Secondary | ICD-10-CM

## 2018-02-02 DIAGNOSIS — R399 Unspecified symptoms and signs involving the genitourinary system: Secondary | ICD-10-CM | POA: Diagnosis not present

## 2018-02-02 NOTE — Progress Notes (Signed)
   02/02/2018 4:15 PM   Isaiah Walker [birth year] [id number]  Reason for visit: Follow up LUTS, BPH, history of stones, elevated PSA  HPI: I had the pleasure of seeing Isaiah Walker today in urology clinic for the above issues.  Briefly, regarding his lower urinary tract symptoms his symptoms are currently well controlled on anticholinergic and finasteride.  He is very satisfied with his current urinary function.  He has an extensive stone history with over 30 stone events prior.  He thinks he passed a small stone a few months ago.  Denies any current flank pain.  He has also had PCNL and bilateral ureteroscopy.  He denies any prior 24-hour urine collection.  Finally, he has a history of a mildly elevated PSA, negative biopsy in 2015.    ROS: Please see flowsheet from today's date for complete review of systems.  Physical Exam: BP (!) 160/92 (BP Location: Left Arm, Patient Position: Sitting, Cuff Size: Normal)   Pulse 60   Ht 5\' 6"  (1.676 m)   Wt 259 lb 9.6 oz (117.8 kg)   BMI 41.90 kg/m   Constitutional:  Alert and oriented, No acute distress. Respiratory: Normal respiratory effort, no increased work of breathing. GI: Abdomen is soft, nontender, nondistended, no abdominal masses GU: No CVA tenderness Skin: No rashes, bruises or suspicious lesions. Neurologic: Grossly intact, no focal deficits, moving all 4 extremities. Psychiatric: Normal mood and affect  Pertinent Imaging: None to review  Assessment & Plan:   In summary, Isaiah Walker is a 72 year old male we are following for multiple urologic concerns.  Currently doing well with no acute complaints.  1.  Extensive history of calcium oxalate kidney stones. Counseled extensively on increasing fluid intake, increasing fruits and vegetables with citrate, and minimizing animal proteins in the diet.  Offered 24-hour urine collection, however he refused.  Six-month KUB and renal ultrasound to evaluate stone burden.  2. Lower urinary  tract symptoms. Well-controlled on finasteride and anticholinergic.  Continue both medications.  IPSS at next visit  3.  PSA screening Most recent PSA stable at 1.5, corrected for finasteride actual PSA is 3.  With his age and normal PSA will discontinue PSA screening at this time.   Return in about 6 months (around 08/05/2018) for RTC with KUB and renal ultrasound prior.  Billey Co, [facility] [address], [place], [place] [address] [phone]

## 2018-07-30 ENCOUNTER — Other Ambulatory Visit: Payer: Self-pay

## 2018-07-30 DIAGNOSIS — R972 Elevated prostate specific antigen [PSA]: Secondary | ICD-10-CM

## 2018-08-02 ENCOUNTER — Other Ambulatory Visit: Payer: Medicare HMO

## 2018-08-02 DIAGNOSIS — R972 Elevated prostate specific antigen [PSA]: Secondary | ICD-10-CM

## 2018-08-03 LAB — PSA: PROSTATE SPECIFIC AG, SERUM: 1.5 ng/mL (ref 0.0–4.0)

## 2018-08-04 ENCOUNTER — Ambulatory Visit
Admission: RE | Admit: 2018-08-04 | Discharge: 2018-08-04 | Disposition: A | Discharge status: Home / Self Care | Payer: Medicare HMO | Source: Ambulatory Visit | Attending: Urology | Admitting: Urology

## 2018-08-04 DIAGNOSIS — N209 Urinary calculus, unspecified: Secondary | ICD-10-CM | POA: Diagnosis not present

## 2018-08-05 ENCOUNTER — Encounter: Payer: Self-pay | Admitting: Urology

## 2018-08-05 ENCOUNTER — Ambulatory Visit: Payer: Medicare HMO | Admitting: Urology

## 2018-08-05 VITALS — BP 177/76 | HR 82 | Ht 66.0 in | Wt 267.0 lb

## 2018-08-05 DIAGNOSIS — N200 Calculus of kidney: Secondary | ICD-10-CM

## 2018-08-05 NOTE — Progress Notes (Signed)
   08/05/2018 3:03 PM   Isaiah Walker 11-11-1945 [id number]  Reason for visit: Follow up nephrolithiasis, PSA screening  HPI: I saw [name] back in urology clinic today for discussion of nephrolithiasis.  He is a 73 year old male with a long history of nephrolithiasis managed with PCNL and ureteroscopy previously by Dr. Justice Britain oxalate).  He has been asymptomatic since I saw him last.  He had an ultrasound and KUB performed recently for surveillance, which showed no hydronephrosis, and ~1 cm left lower pole stone burden.  He is asymptomatic and denies flank pain or gross hematuria.  We also discussed the AUA guidelines regarding PSA screening, and discontinuing screening after age of 56.  His PSA is stable at 1.5 from 1.5(corrected for 3 on finasteride).  He also has a history of a negative prostate biopsy in 2015.  We discussed options for his asymptomatic left lower pole stone burden including SWL, ureteroscopy and surveillance.  He elects surveillance.  He is in agreement to discontinue PSA screening per AUA guideline recommendations.  RTC 1 year with KUB prior, sooner if problems  A total of 15 minutes were spent face-to-face with the patient, greater than 50% was spent in patient education, counseling, and coordination of care regarding nephrolithiasis, stone prevention, and PSA screening.  Billey Co, [facility] [address], [place],  [address] [phone]

## 2018-08-05 NOTE — Patient Instructions (Signed)

## 2019-01-09 ENCOUNTER — Other Ambulatory Visit: Payer: Self-pay

## 2019-01-09 ENCOUNTER — Inpatient Hospital Stay
Admission: EM | Admit: 2019-01-09 | Discharge: 2019-01-14 | DRG: 389 | Disposition: A | Discharge status: Home / Self Care | Payer: Medicare HMO | Attending: Internal Medicine | Admitting: Internal Medicine

## 2019-01-09 ENCOUNTER — Encounter: Payer: Self-pay | Admitting: Emergency Medicine

## 2019-01-09 ENCOUNTER — Emergency Department: Payer: Medicare HMO

## 2019-01-09 DIAGNOSIS — Z6841 Body Mass Index (BMI) 40.0 and over, adult: Secondary | ICD-10-CM

## 2019-01-09 DIAGNOSIS — I10 Essential (primary) hypertension: Secondary | ICD-10-CM | POA: Diagnosis present

## 2019-01-09 DIAGNOSIS — K913 Postprocedural intestinal obstruction, unspecified as to partial versus complete: Secondary | ICD-10-CM

## 2019-01-09 DIAGNOSIS — K91858 Other complications of intestinal pouch: Secondary | ICD-10-CM | POA: Diagnosis present

## 2019-01-09 DIAGNOSIS — Z791 Long term (current) use of non-steroidal anti-inflammatories (NSAID): Secondary | ICD-10-CM

## 2019-01-09 DIAGNOSIS — Z8249 Family history of ischemic heart disease and other diseases of the circulatory system: Secondary | ICD-10-CM

## 2019-01-09 DIAGNOSIS — R109 Unspecified abdominal pain: Secondary | ICD-10-CM | POA: Diagnosis present

## 2019-01-09 DIAGNOSIS — K5090 Crohn's disease, unspecified, without complications: Secondary | ICD-10-CM | POA: Diagnosis present

## 2019-01-09 DIAGNOSIS — K5669 Other partial intestinal obstruction: Principal | ICD-10-CM | POA: Diagnosis present

## 2019-01-09 DIAGNOSIS — K635 Polyp of colon: Secondary | ICD-10-CM | POA: Diagnosis present

## 2019-01-09 DIAGNOSIS — D72829 Elevated white blood cell count, unspecified: Secondary | ICD-10-CM | POA: Diagnosis present

## 2019-01-09 DIAGNOSIS — N400 Enlarged prostate without lower urinary tract symptoms: Secondary | ICD-10-CM | POA: Diagnosis present

## 2019-01-09 DIAGNOSIS — K56699 Other intestinal obstruction unspecified as to partial versus complete obstruction: Secondary | ICD-10-CM | POA: Diagnosis not present

## 2019-01-09 DIAGNOSIS — Z91041 Radiographic dye allergy status: Secondary | ICD-10-CM | POA: Diagnosis not present

## 2019-01-09 DIAGNOSIS — Z20828 Contact with and (suspected) exposure to other viral communicable diseases: Secondary | ICD-10-CM | POA: Diagnosis present

## 2019-01-09 DIAGNOSIS — K50912 Crohn's disease, unspecified, with intestinal obstruction: Secondary | ICD-10-CM | POA: Diagnosis not present

## 2019-01-09 DIAGNOSIS — K621 Rectal polyp: Secondary | ICD-10-CM | POA: Diagnosis present

## 2019-01-09 DIAGNOSIS — Z9049 Acquired absence of other specified parts of digestive tract: Secondary | ICD-10-CM | POA: Diagnosis not present

## 2019-01-09 DIAGNOSIS — E860 Dehydration: Secondary | ICD-10-CM | POA: Diagnosis present

## 2019-01-09 DIAGNOSIS — Z79899 Other long term (current) drug therapy: Secondary | ICD-10-CM | POA: Diagnosis not present

## 2019-01-09 DIAGNOSIS — Z87442 Personal history of urinary calculi: Secondary | ICD-10-CM | POA: Diagnosis not present

## 2019-01-09 LAB — CBC
HCT: 46.5 % (ref 39.0–52.0)
Hemoglobin: 15.9 g/dL (ref 13.0–17.0)
MCH: 29.9 pg (ref 26.0–34.0)
MCHC: 34.2 g/dL (ref 30.0–36.0)
MCV: 87.4 fL (ref 80.0–100.0)
Platelets: 211 10*3/uL (ref 150–400)
RBC: 5.32 MIL/uL (ref 4.22–5.81)
RDW: 13.3 % (ref 11.5–15.5)
WBC: 16.2 10*3/uL — ABNORMAL HIGH (ref 4.0–10.5)
nRBC: 0 % (ref 0.0–0.2)

## 2019-01-09 LAB — COMPREHENSIVE METABOLIC PANEL
ALT: 38 U/L (ref 0–44)
AST: 17 U/L (ref 15–41)
Albumin: 4.4 g/dL (ref 3.5–5.0)
Alkaline Phosphatase: 35 U/L — ABNORMAL LOW (ref 38–126)
Anion gap: 11 (ref 5–15)
BUN: 19 mg/dL (ref 8–23)
CO2: 23 mmol/L (ref 22–32)
Calcium: 9.4 mg/dL (ref 8.9–10.3)
Chloride: 104 mmol/L (ref 98–111)
Creatinine, Ser: 0.95 mg/dL (ref 0.61–1.24)
GFR calc Af Amer: >60 mL/min (ref >60)
GFR calc non Af Amer: >60 mL/min (ref >60)
Glucose, Bld: 124 mg/dL — ABNORMAL HIGH (ref 70–99)
Potassium: 4.3 mmol/L (ref 3.5–5.1)
Sodium: 138 mmol/L (ref 135–145)
Total Bilirubin: 0.9 mg/dL (ref 0.3–1.2)
Total Protein: 7.4 g/dL (ref 6.5–8.1)

## 2019-01-09 LAB — LIPASE, BLOOD: Lipase: 20 U/L (ref 11–51)

## 2019-01-09 LAB — SARS CORONAVIRUS 2 BY RT PCR (HOSPITAL ORDER, PERFORMED IN ~~LOC~~ HOSPITAL LAB): SARS Coronavirus 2: NEGATIVE

## 2019-01-09 MED ORDER — METOPROLOL TARTRATE 50 MG PO TABS
50.0000 mg | ORAL_TABLET | Freq: Two times a day (BID) | ORAL | Status: DC
  Administered 2019-01-09 – 2019-01-14 (×10): 50 mg via ORAL
  Filled 2019-01-09 (×10): qty 1

## 2019-01-09 MED ORDER — AMLODIPINE BESYLATE 5 MG PO TABS
2.5000 mg | ORAL_TABLET | Freq: Every day | ORAL | Status: DC
  Administered 2019-01-09 – 2019-01-14 (×6): 2.5 mg via ORAL
  Filled 2019-01-09 (×6): qty 1

## 2019-01-09 MED ORDER — TRAZODONE HCL 50 MG PO TABS
150.0000 mg | ORAL_TABLET | Freq: Every day | ORAL | Status: DC
  Administered 2019-01-09 – 2019-01-13 (×5): 150 mg via ORAL
  Filled 2019-01-09 (×5): qty 1

## 2019-01-09 MED ORDER — TRAMADOL HCL 50 MG PO TABS
50.0000 mg | ORAL_TABLET | Freq: Four times a day (QID) | ORAL | Status: DC | PRN
  Administered 2019-01-09 – 2019-01-12 (×4): 50 mg via ORAL
  Filled 2019-01-09 (×4): qty 1

## 2019-01-09 MED ORDER — MORPHINE SULFATE (PF) 2 MG/ML IV SOLN
2.0000 mg | INTRAVENOUS | Status: DC | PRN
  Administered 2019-01-09 – 2019-01-10 (×4): 2 mg via INTRAVENOUS
  Filled 2019-01-09 (×4): qty 1

## 2019-01-09 MED ORDER — SODIUM CHLORIDE 0.9% FLUSH: 3.0000 mL | Freq: Once | INTRAVENOUS | Status: DC

## 2019-01-09 MED ORDER — SODIUM CHLORIDE 0.9 % IV SOLN
INTRAVENOUS | Status: AC
  Administered 2019-01-09 – 2019-01-10 (×2): via INTRAVENOUS

## 2019-01-09 MED ORDER — METOPROLOL TARTRATE 5 MG/5ML IV SOLN
5.0000 mg | INTRAVENOUS | Status: DC | PRN
  Administered 2019-01-09 – 2019-01-10 (×2): 5 mg via INTRAVENOUS
  Filled 2019-01-09 (×2): qty 5

## 2019-01-09 MED ORDER — FINASTERIDE 5 MG PO TABS
5.0000 mg | ORAL_TABLET | Freq: Every day | ORAL | Status: DC
  Administered 2019-01-10 – 2019-01-14 (×5): 5 mg via ORAL
  Filled 2019-01-09 (×5): qty 1

## 2019-01-09 MED ORDER — ONDANSETRON HCL 4 MG/2ML IJ SOLN
4.0000 mg | Freq: Four times a day (QID) | INTRAMUSCULAR | Status: DC | PRN
  Administered 2019-01-09 – 2019-01-11 (×4): 4 mg via INTRAVENOUS
  Filled 2019-01-09 (×3): qty 2

## 2019-01-09 MED ORDER — PANTOPRAZOLE SODIUM 40 MG IV SOLR
40.0000 mg | INTRAVENOUS | Status: DC
  Administered 2019-01-10 – 2019-01-14 (×5): 40 mg via INTRAVENOUS
  Filled 2019-01-09 (×5): qty 40

## 2019-01-09 MED ORDER — ONDANSETRON HCL 4 MG/2ML IJ SOLN
INTRAMUSCULAR | Status: AC
  Filled 2019-01-09: qty 2

## 2019-01-09 MED ORDER — PROCHLORPERAZINE EDISYLATE 10 MG/2ML IJ SOLN
10.0000 mg | Freq: Four times a day (QID) | INTRAMUSCULAR | Status: DC | PRN
  Administered 2019-01-10: 03:00:00 10 mg via INTRAVENOUS
  Filled 2019-01-09 (×2): qty 2

## 2019-01-09 MED ORDER — ONDANSETRON HCL 4 MG/2ML IJ SOLN
4.0000 mg | Freq: Once | INTRAMUSCULAR | Status: AC
  Administered 2019-01-09: 4 mg via INTRAVENOUS
  Filled 2019-01-09: qty 2

## 2019-01-09 MED ORDER — ONDANSETRON HCL 4 MG PO TABS: 4.0000 mg | ORAL_TABLET | Freq: Four times a day (QID) | ORAL | Status: DC | PRN

## 2019-01-09 MED ORDER — MORPHINE SULFATE (PF) 4 MG/ML IV SOLN
4.0000 mg | Freq: Once | INTRAVENOUS | Status: AC
  Administered 2019-01-09: 4 mg via INTRAVENOUS
  Filled 2019-01-09: qty 1

## 2019-01-09 NOTE — Progress Notes (Signed)
Family Meeting Note  Advance Directive:yes  Today a meeting took place with the Patient.   The following clinical team members were present during this meeting:MD  The following were discussed:Patient's diagnosis: Acute lower abdominal pain associated with nausea, history of Crohn's disease, hypertension, benign prostatic hypertrophy and leukocytosis is admitted to the hospital treatment plan of care discussed in detail with the patient.  He verbalized understanding of the plan.  , Patient's progosis: Unable to determine and Goals for treatment: Full Code  Wife and daughter Margreta Journey and healthcare power of attorney  Additional follow-up to be provided: Hospitalist, gastroenterology and surgery  Time spent during discussion:17 min  Nicholes Mango, MD

## 2019-01-09 NOTE — ED Notes (Signed)
ED TO INPATIENT HANDOFF REPORT  ED Nurse Name and Phone #: [id number], [place] Name/Age/Gender Berline Chough 73 y.o. male Room/Bed: ED03A/ED03A  Code Status   Code Status: Prior  Home/SNF/Other Home Patient oriented to: self, place, time and situation Is this baseline? Yes   Triage Complete: Triage complete  Chief Complaint Abdominal Cramps  Triage Note Pt presents to ED via POV with c/o mid abdominal pain since 0300 today. Pt states some nausea, denies V/D. Pt states hx of umbilical hernia that was oeprated on and another replaced it. Rates pain 5/10 and stabbing, states last BM was this normal and regular.    Allergies Allergies  Allergen Reactions  . Ivp Dye [Iodinated Diagnostic Agents] Rash and Other (See Comments)    Pt has had CT scan since. Pt states "i'm fine"    Level of Care/Admitting Diagnosis ED Disposition    ED Disposition Condition [place] [facility] Area: [place] [[id number]]  Level of Care: Med-Surg [16]  Covid Evaluation: Person Under Investigation (PUI)  Diagnosis: Acute abdominal pain [338250]  Admitting Physician: Nicholes Mango [[id number]]  Attending Physician: Nicholes Mango [[id number]]  Estimated length of stay: 3 - 4 days  Certification:: I certify this patient will need inpatient services for at least 2 midnights  Bed request comments: 2C  PT Class (Do Not Modify): Inpatient [101]  PT Acc Code (Do Not Modify): Private [1]       B Medical/Surgery History Past Medical History:  Diagnosis Date  . Arthritis   . BPH (benign prostatic hyperplasia)   . Crohn's disease ([place])   . Hypertension   . Kidney stones    Past Surgical History:  Procedure Laterality Date  . CHOLECYSTECTOMY    . CHOLECYSTECTOMY, LAPAROSCOPIC N/A   . COLON SURGERY     Colectomy 2005  . CYSTOSCOPY W/ RETROGRADES Bilateral 04/01/2017   Procedure: CYSTOSCOPY WITH RETROGRADE PYELOGRAM;  Surgeon: Hollice Espy, MD;  Location: [facility] ORS;  Service:  Urology;  Laterality: Bilateral;  . CYSTOSCOPY WITH STENT PLACEMENT Bilateral 04/01/2017   Procedure: CYSTOSCOPY WITH STENT PLACEMENT;  Surgeon: Hollice Espy, MD;  Location: [facility] ORS;  Service: Urology;  Laterality: Bilateral;  . CYSTOSCOPY/URETEROSCOPY/HOLMIUM LASER/STENT PLACEMENT Left 10/16/2014   Procedure: CYSTOSCOPY/URETEROSCOPY/HOLMIUM LASER/STENT PLACEMENT;  Surgeon: Hollice Espy, MD;  Location: [facility] ORS;  Service: Urology;  Laterality: Left;  . CYSTOSCOPY/URETEROSCOPY/HOLMIUM LASER/STENT PLACEMENT Bilateral 04/20/2017   Procedure: CYSTOSCOPY/URETEROSCOPY/HOLMIUM LASER/STENT EXCHANGE;  Surgeon: Hollice Espy, MD;  Location: [facility] ORS;  Service: Urology;  Laterality: Bilateral;  . HERNIA REPAIR    . Percutaneous Left    Nephrolithotomy     A IV Location/Drains/Wounds Patient Lines/Drains/Airways Status   Active Line/Drains/Airways    Name:   Placement date:   Placement time:   Site:   Days:   Peripheral IV 01/09/19 Left Antecubital   01/09/19    1608    Antecubital   less than 1   Ureteral Drain/Stent Left ureter 6 Fr.   04/01/17    1235    Left ureter   648   Ureteral Drain/Stent Right ureter 6 Fr.   04/01/17    1239    Right ureter   648   Ureteral Drain/Stent Left ureter 6 Fr.   04/20/17    1034    Left ureter   629   Ureteral Drain/Stent Right ureter 6 Fr.   04/20/17    1035    Right ureter   629   Incision (Closed) 04/20/17 Penis  Other (Comment)   04/20/17    1030     629          Intake/Output Last 24 hours No intake or output data in the 24 hours ending 01/09/19 1806  Labs/Imaging Results for orders placed or performed during the hospital encounter of 01/09/19 (from the past 48 hour(s))  Lipase, blood     Status: None   Collection Time: 01/09/19  3:49 PM  Result Value Ref Range   Lipase 20 11 - 51 U/L    Comment: Performed at [facility], [address] [place], [place] [address]  Comprehensive metabolic panel     Status: Abnormal   Collection Time:  01/09/19  3:49 PM  Result Value Ref Range   Sodium 138 135 - 145 mmol/L   Potassium 4.3 3.5 - 5.1 mmol/L   Chloride 104 98 - 111 mmol/L   CO2 23 22 - 32 mmol/L   Glucose, Bld 124 (H) 70 - 99 mg/dL   BUN 19 8 - 23 mg/dL   Creatinine, Ser 0.95 0.61 - 1.24 mg/dL   Calcium 9.4 8.9 - 10.3 mg/dL   Total Protein 7.4 6.5 - 8.1 g/dL   Albumin 4.4 3.5 - 5.0 g/dL   AST 17 15 - 41 U/L   ALT 38 0 - 44 U/L   Alkaline Phosphatase 35 (L) 38 - 126 U/L   Total Bilirubin 0.9 0.3 - 1.2 mg/dL   GFR calc non Af Amer >60 >60 mL/min   GFR calc Af Amer >60 >60 mL/min   Anion gap 11 5 - 15    Comment: Performed at [facility], [address] [place], [place] [address]  CBC     Status: Abnormal   Collection Time: 01/09/19  3:49 PM  Result Value Ref Range   WBC 16.2 (H) 4.0 - 10.5 K/uL   RBC 5.32 4.22 - 5.81 MIL/uL   Hemoglobin 15.9 13.0 - 17.0 g/dL   HCT 46.5 39.0 - 52.0 %   MCV 87.4 80.0 - 100.0 fL   MCH 29.9 26.0 - 34.0 pg   MCHC 34.2 30.0 - 36.0 g/dL   RDW 13.3 11.5 - 15.5 %   Platelets 211 150 - 400 K/uL   nRBC 0.0 0.0 - 0.2 %    Comment: Performed at [facility], [place], [place] [address]   Ct Abdomen Pelvis Wo Contrast  Result Date: 01/09/2019 CLINICAL DATA:  Abdominal distension, mid abdominal pain, nausea, history of umbilical hernia EXAM: CT ABDOMEN AND PELVIS WITHOUT CONTRAST TECHNIQUE: Multidetector CT imaging of the abdomen and pelvis was performed following the standard protocol without IV contrast. COMPARISON:  04/01/2017 FINDINGS: Lower chest: No acute abnormality.  Coronary artery calcifications. Hepatobiliary: No focal liver abnormality is seen. Hepatic steatosis. Status post cholecystectomy. No biliary dilatation. Pancreas: Unremarkable. No pancreatic ductal dilatation or surrounding inflammatory changes. Spleen: Normal in size without significant abnormality. Adrenals/Urinary Tract: Adrenal glands are unremarkable. Atrophic appearing kidneys.  Nonobstructive calculi of the inferior pole of the left kidney. Exophytic cyst of the right kidney. Bladder is unremarkable. Stomach/Bowel: Stomach is within normal limits. The patient is status post terminal ileocecal resection. The distal small bowel is fecalized and fluid-filled without overt distention, maximum caliber 3.2 cm. The colon is decompressed to the rectum. Vascular/Lymphatic: Aortic atherosclerosis. No enlarged abdominal or pelvic lymph nodes. Reproductive: No mass or other significant abnormality. Other: Small fat containing inguinal hernias. No abdominopelvic ascites. Musculoskeletal: No acute or significant osseous findings.  IMPRESSION: 1. The patient is status post terminal ileocecal resection. The distal small bowel is fecalized and fluid-filled without overt distention, maximum caliber 3.2 cm. The colon is decompressed to the rectum. Findings are suspicious for stricture and partial obstruction at the anastomosis, however generally unchanged compared to prior examination dated 04/01/2017. 2.  Other chronic and incidental findings as detailed above. Electronically Signed   By: Eddie Candle M.D.   On: 01/09/2019 16:51    Pending Labs Unresulted Labs (From admission, onward)    Start     Ordered   01/09/19 1638  SARS Coronavirus 2 (CEPHEID - Performed in [place] hospital lab), Hosp Order  (Asymptomatic Patients Labs)  Once,   STAT    Question:  Rule Out  Answer:  Yes   01/09/19 1637   01/09/19 1553  Urinalysis, Complete w Microscopic  ONCE - STAT,   STAT     01/09/19 1552          Vitals/Pain Today's Vitals   01/09/19 1707 01/09/19 1711 01/09/19 1711 01/09/19 1800  BP:    (!) 154/85  Pulse: 84   73  Resp: 19   13  Temp:      TempSrc:      SpO2: (!) 88% 97%  96%  Weight:      Height:      PainSc:   3      Isolation Precautions No active isolations  Medications Medications  sodium chloride flush (NS) 0.9 % injection 3 mL (3 mLs Intravenous Not Given 01/09/19  1645)  morphine 4 MG/ML injection 4 mg (4 mg Intravenous Given 01/09/19 1644)  ondansetron (ZOFRAN) injection 4 mg (4 mg Intravenous Given 01/09/19 1643)    Mobility walks Low fall risk   Focused Assessments Abdomen distended but soft. Umbilical area pain, feels similar to last SBO.   R Recommendations: See Admitting Provider Note  Report given to:   Additional Notes:

## 2019-01-09 NOTE — ED Provider Notes (Signed)
[facility] Emergency Department Provider Note   ____________________________________________    I have reviewed the triage vital signs and the nursing notes.   HISTORY  Chief Complaint Abdominal Pain     HPI Isaiah Walker is a 73 y.o. male with a history of Crohn's disease, colon surgery, ventral hernia surgery, cholecystectomy who presents today with complaints of abdominal pain and distention.  Patient states moderate mid abdominal pain started yesterday around 3 PM, has continued, he complains of distention.  Reports normal bowel movement this morning.  Some nausea no vomiting.  Has not take anything for this.  Past Medical History:  Diagnosis Date  . Arthritis   . BPH (benign prostatic hyperplasia)   . Crohn's disease ([place])   . Hypertension   . Kidney stones     Patient Active Problem List   Diagnosis Date Noted  . Acute abdominal pain 01/09/2019  . Acute cystitis with hematuria   . Acute renal insufficiency   . Kidney stone 04/01/2017    Past Surgical History:  Procedure Laterality Date  . CHOLECYSTECTOMY    . CHOLECYSTECTOMY, LAPAROSCOPIC N/A   . COLON SURGERY     Colectomy 2005  . CYSTOSCOPY W/ RETROGRADES Bilateral 04/01/2017   Procedure: CYSTOSCOPY WITH RETROGRADE PYELOGRAM;  Surgeon: Hollice Espy, MD;  Location: [facility] ORS;  Service: Urology;  Laterality: Bilateral;  . CYSTOSCOPY WITH STENT PLACEMENT Bilateral 04/01/2017   Procedure: CYSTOSCOPY WITH STENT PLACEMENT;  Surgeon: Hollice Espy, MD;  Location: [facility] ORS;  Service: Urology;  Laterality: Bilateral;  . CYSTOSCOPY/URETEROSCOPY/HOLMIUM LASER/STENT PLACEMENT Left 10/16/2014   Procedure: CYSTOSCOPY/URETEROSCOPY/HOLMIUM LASER/STENT PLACEMENT;  Surgeon: Hollice Espy, MD;  Location: [facility] OR[facility];  Service: Urology;  Laterality: Left;  . CYSTOSCOPY/URETEROSCOPY/HOLMIUM LASER/STENT PLACEMENT Bilateral 04/20/2017   Procedure: CYSTOSCOPY/URETEROSCOPY/HOLMIUM LASER/STENT EXCHANGE;   Surgeon: Hollice Espy, MD;  Location: [facility] ORS;  Service: Urology;  Laterality: Bilateral;  . HERNIA REPAIR    . Percutaneous Left    Nephrolithotomy    Prior to Admission medications   Medication Sig Start Date End Date Taking? Authorizing Provider  allopurinol (ZYLOPRIM) 300 MG tablet Take 300 mg by mouth daily.   Yes [provider]  amLODipine (NORVASC) 2.5 MG tablet Take 2.5 mg by mouth daily.   Yes [provider]  finasteride (PROSCAR) 5 MG tablet Take 1 tablet (5 mg total) by mouth daily. 04/20/15  Yes Nickie Retort, MD  losartan (COZAAR) 100 MG tablet TAKE 1 TABLET BY MOUTH EVERY DAY IN THE MORNING 07/19/18  Yes [provider]  meloxicam (MOBIC) 15 MG tablet Take 15 mg by mouth daily as needed.  07/25/18  Yes [provider]  metoprolol (LOPRESSOR) 50 MG tablet Take 50 mg by mouth 2 (two) times daily.   Yes [provider]  Multiple Vitamin (MULTIVITAMIN WITH MINERALS) TABS tablet Take 1 tablet by mouth daily.   Yes [provider]  phentermine 37.5 MG capsule Take 37.5 mg by mouth every morning.   Yes [provider]  traZODone (DESYREL) 150 MG tablet Take 150 mg by mouth at bedtime.   Yes [provider]     Allergies Ivp dye [iodinated diagnostic agents]  No family history on file.  Social History Social History   Tobacco Use  . Smoking status: Never Smoker  . Smokeless tobacco: Never Used  Substance Use Topics  . Alcohol use: No  . Drug use: No    Review of Systems  Constitutional: No fever/chills Eyes: No visual changes.  ENT: No sore throat. Cardiovascular: Denies chest pain. Respiratory: Denies shortness of breath. Gastrointestinal: As above Genitourinary: Negative for dysuria. Musculoskeletal: Negative for back pain. Skin: Negative for rash. Neurological: Negative for headaches   ____________________________________________   PHYSICAL EXAM:  VITAL SIGNS: ED Triage  Vitals  Enc Vitals Group     BP 01/09/19 1548 (!) 182/89     Pulse Rate 01/09/19 1548 (!) 104     Resp 01/09/19 1548 20     Temp 01/09/19 1548 98.4 F (36.9 C)     Temp Source 01/09/19 1548 Oral     SpO2 01/09/19 1548 95 %     Weight 01/09/19 1551 120.2 kg (265 lb)     Height 01/09/19 1551 1.676 m (5\' 6" )     Head Circumference --      Peak Flow --      Pain Score 01/09/19 1551 5     Pain Loc --      Pain Edu? --      Excl. in [place]? --     Constitutional: Alert and oriented.  Eyes: Conjunctivae are normal.  Head: Atraumatic.  Mouth/Throat: Mucous membranes are moist.    Cardiovascular: Normal rate, regular rhythm. Grossly normal Walker sounds.  Good peripheral circulation. Respiratory: Normal respiratory effort.  No retractions. Gastrointestinal: Mild tenderness mid abdomen, periumbilical, moderate distention, no CVA tenderness Genitourinary: deferred Musculoskeletal:  Warm and well perfused Neurologic:  Normal speech and language. No gross focal neurologic deficits are appreciated.  Skin:  Skin is warm, dry and intact. No rash noted. Psychiatric: Mood and affect are normal. Speech and behavior are normal.  ____________________________________________   LABS (all labs ordered are listed, but only abnormal results are displayed)  Labs Reviewed  COMPREHENSIVE METABOLIC PANEL - Abnormal; Notable for the following components:      Result Value   Glucose, Bld 124 (*)    Alkaline Phosphatase 35 (*)    All other components within normal limits  CBC - Abnormal; Notable for the following components:   WBC 16.2 (*)    All other components within normal limits  SARS CORONAVIRUS 2 (HOSPITAL ORDER, [place] LAB)  LIPASE, BLOOD  URINALYSIS, COMPLETE (UACMP) WITH MICROSCOPIC   ____________________________________________  EKG  None ____________________________________________  RADIOLOGY  CT abdomen pelvis, patient has IV dye allergy, not tolerating  p.o. ____________________________________________   PROCEDURES  Procedure(s) performed: No  Procedures   Critical Care performed: No ____________________________________________   INITIAL IMPRESSION / ASSESSMENT AND PLAN / ED COURSE  Pertinent labs & imaging results that were available during my care of the patient were reviewed by me and considered in my medical decision making (see chart for details).  Patient presents with abdominal pain and distention significant surgical history, concerning for SBO, will obtain Noncon scan given allergy.  IV morphine, IV Zofran for pain and nausea.  Clinical Course as of Jan 09 1815  Sun Jan 09, 2019  1704 Consulted Dr. Peyton Najjar re: likely stricture at anastomosis causing symptoms he will see the patient   [RK]    Clinical Course User Index [RK] Lavonia Drafts, MD    Seen by Dr. Peyton Najjar who recommends admission to medicine for Crohn's eval, discussed with Dr. Margaretmary Eddy of internal medicine ____________________________________________   FINAL CLINICAL IMPRESSION(S) / ED DIAGNOSES  Final diagnoses:  Colorectal anastomotic stricture        Note:  This document was prepared using Dragon voice recognition software and may include unintentional dictation errors.   Jovanie Verge,  Herbie [place], MD 01/09/19 1816

## 2019-01-09 NOTE — H&P (Signed)
[facility] [place] NAME: Isaiah Walker    MR#:  [id number]  DATE OF BIRTH:  03/22/1946  DATE OF ADMISSION:  01/09/2019  PRIMARY CARE PHYSICIAN: Danelle Berry, NP   REQUESTING/REFERRING PHYSICIAN: [place]  CHIEF COMPLAINT:  Abdominal pain  HISTORY OF PRESENT ILLNESS:  Isaiah Walker  is a 73 y.o. male with a known history of benign prostate hypertrophy, hypertension, Crohn's disease status post terminal ileocecal resection, is presenting to the ED with a chief complaint of acute lower abdominal pain associated with nausea but denies any vomiting or diarrhea.  Denies any fever.  Not in contact with covered patients CT scan has revealed fluid-filled small bowel without overt distention finding suspicious for stricture and partial obstruction of the anastomosis.  Seen and evaluated by surgery who is recommending to admit to hospitalist service.  During my examination patient is resting comfortably as pain medication was given pain in the abdomen was 2 out of 10.  PAST MEDICAL HISTORY:   Past Medical History:  Diagnosis Date  . Arthritis   . BPH (benign prostatic hyperplasia)   . Crohn's disease ([place])   . Hypertension   . Kidney stones     PAST SURGICAL HISTOIRY:   Past Surgical History:  Procedure Laterality Date  . CHOLECYSTECTOMY    . CHOLECYSTECTOMY, LAPAROSCOPIC N/A   . COLON SURGERY     Colectomy 2005  . CYSTOSCOPY W/ RETROGRADES Bilateral 04/01/2017   Procedure: CYSTOSCOPY WITH RETROGRADE PYELOGRAM;  Surgeon: Hollice Espy, MD;  Location: [facility];  Service: Urology;  Laterality: Bilateral;  . CYSTOSCOPY WITH STENT PLACEMENT Bilateral 04/01/2017   Procedure: CYSTOSCOPY WITH STENT PLACEMENT;  Surgeon: Hollice Espy, MD;  Location: [facility];  Service: Urology;  Laterality: Bilateral;  . CYSTOSCOPY/URETEROSCOPY/HOLMIUM LASER/STENT PLACEMENT Left 10/16/2014   Procedure: CYSTOSCOPY/URETEROSCOPY/HOLMIUM LASER/STENT PLACEMENT;   Surgeon: Hollice Espy, MD;  Location: [facility];  Service: Urology;  Laterality: Left;  . CYSTOSCOPY/URETEROSCOPY/HOLMIUM LASER/STENT PLACEMENT Bilateral 04/20/2017   Procedure: CYSTOSCOPY/URETEROSCOPY/HOLMIUM LASER/STENT EXCHANGE;  Surgeon: Hollice Espy, MD;  Location: [facility];  Service: Urology;  Laterality: Bilateral;  . HERNIA REPAIR    . Percutaneous Left    Nephrolithotomy    SOCIAL HISTORY:   Social History   Tobacco Use  . Smoking status: Never Smoker  . Smokeless tobacco: Never Used  Substance Use Topics  . Alcohol use: No    FAMILY HISTORY:  Hypertension  DRUG ALLERGIES:   Allergies  Allergen Reactions  . Ivp Dye [Iodinated Diagnostic Agents] Rash and Other (See Comments)    Pt has had CT scan since. Pt states "i'm fine"    REVIEW OF SYSTEMS:  CONSTITUTIONAL: No fever, fatigue or weakness.  EYES: No blurred or double vision.  EARS, NOSE, AND THROAT: No tinnitus or ear pain.  RESPIRATORY: No cough, shortness of breath, wheezing or hemoptysis.  CARDIOVASCULAR: No chest pain, orthopnea, edema.  GASTROINTESTINAL: Reporting acute lower abdominal pain associated with nausea denies any vomiting or diarrhea  GENITOURINARY: No dysuria, hematuria.  ENDOCRINE: No polyuria, nocturia,  HEMATOLOGY: No anemia, easy bruising or bleeding SKIN: No rash or lesion. MUSCULOSKELETAL: No joint pain or arthritis.   NEUROLOGIC: No tingling, numbness, weakness.  PSYCHIATRY: No anxiety or depression.   MEDICATIONS AT HOME:   Prior to Admission medications   Medication Sig Start Date End Date Taking? Authorizing Provider  allopurinol (ZYLOPRIM) 300 MG tablet Take 300 mg by mouth daily.   Yes [provider]  amLODipine (NORVASC) 2.5 MG  tablet Take 2.5 mg by mouth daily.   Yes [provider]  finasteride (PROSCAR) 5 MG tablet Take 1 tablet (5 mg total) by mouth daily. 04/20/15  Yes Nickie Retort, MD  losartan (COZAAR) 100 MG tablet TAKE 1 TABLET BY MOUTH  EVERY DAY IN THE MORNING 07/19/18  Yes [provider]  meloxicam (MOBIC) 15 MG tablet Take 15 mg by mouth daily as needed.  07/25/18  Yes [provider]  metoprolol (LOPRESSOR) 50 MG tablet Take 50 mg by mouth 2 (two) times daily.   Yes [provider]  Multiple Vitamin (MULTIVITAMIN WITH MINERALS) TABS tablet Take 1 tablet by mouth daily.   Yes [provider]  phentermine 37.5 MG capsule Take 37.5 mg by mouth every morning.   Yes [provider]  traZODone (DESYREL) 150 MG tablet Take 150 mg by mouth at bedtime.   Yes [provider]      VITAL SIGNS:  Blood pressure (!) 154/85, pulse 73, temperature 98.4 F (36.9 C), temperature source Oral, resp. rate 13, height 5\' 6"  (1.676 m), weight 120.2 kg, SpO2 96 %.  PHYSICAL EXAMINATION:  GENERAL:  73 y.o.-year-old patient lying in the bed with no acute distress.  EYES: Pupils equal, round, reactive to light and accommodation. No scleral icterus. Extraocular muscles intact.  HEENT: Head atraumatic, normocephalic. Oropharynx and nasopharynx clear.  NECK:  Supple, no jugular venous distention. No thyroid enlargement, no tenderness.  LUNGS: Normal breath sounds bilaterally, no wheezing, rales,rhonchi or crepitation. No use of accessory muscles of respiration.  CARDIOVASCULAR: S1, S2 normal. No murmurs, rubs, or gallops.  ABDOMEN: Soft, generalized tenderness is present no rebound tenderness bowel sounds are hypoactive , distended EXTREMITIES: No pedal edema, cyanosis, or clubbing.  NEUROLOGIC: Cranial nerves II through XII are intact. Muscle strength 5/5 in all extremities. Sensation intact. Gait not checked.  PSYCHIATRIC: The patient is alert and oriented x 3.  SKIN: No obvious rash, lesion, or ulcer.   LABORATORY PANEL:   CBC Recent Labs  Lab 01/09/19 1549  WBC 16.2*  HGB 15.9  HCT 46.5  PLT 211    ------------------------------------------------------------------------------------------------------------------  Chemistries  Recent Labs  Lab 01/09/19 1549  NA 138  K 4.3  CL 104  CO2 23  GLUCOSE 124*  BUN 19  CREATININE 0.95  CALCIUM 9.4  AST 17  ALT 38  ALKPHOS 35*  BILITOT 0.9   ------------------------------------------------------------------------------------------------------------------  Cardiac Enzymes No results for input(s): TROPONINI in the last 168 hours. ------------------------------------------------------------------------------------------------------------------  RADIOLOGY:  Ct Abdomen Pelvis Wo Contrast  Result Date: 01/09/2019 CLINICAL DATA:  Abdominal distension, mid abdominal pain, nausea, history of umbilical hernia EXAM: CT ABDOMEN AND PELVIS WITHOUT CONTRAST TECHNIQUE: Multidetector CT imaging of the abdomen and pelvis was performed following the standard protocol without IV contrast. COMPARISON:  04/01/2017 FINDINGS: Lower chest: No acute abnormality.  Coronary artery calcifications. Hepatobiliary: No focal liver abnormality is seen. Hepatic steatosis. Status post cholecystectomy. No biliary dilatation. Pancreas: Unremarkable. No pancreatic ductal dilatation or surrounding inflammatory changes. Spleen: Normal in size without significant abnormality. Adrenals/Urinary Tract: Adrenal glands are unremarkable. Atrophic appearing kidneys. Nonobstructive calculi of the inferior pole of the left kidney. Exophytic cyst of the right kidney. Bladder is unremarkable. Stomach/Bowel: Stomach is within normal limits. The patient is status post terminal ileocecal resection. The distal small bowel is fecalized and fluid-filled without overt distention, maximum caliber 3.2 cm. The colon is decompressed to the rectum. Vascular/Lymphatic: Aortic atherosclerosis. No enlarged abdominal or pelvic lymph nodes. Reproductive: No mass  or other significant abnormality. Other: Small  fat containing inguinal hernias. No abdominopelvic ascites. Musculoskeletal: No acute or significant osseous findings. IMPRESSION: 1. The patient is status post terminal ileocecal resection. The distal small bowel is fecalized and fluid-filled without overt distention, maximum caliber 3.2 cm. The colon is decompressed to the rectum. Findings are suspicious for stricture and partial obstruction at the anastomosis, however generally unchanged compared to prior examination dated 04/01/2017. 2.  Other chronic and incidental findings as detailed above. Electronically Signed   By: Eddie Candle M.D.   On: 01/09/2019 16:51    EKG:   Orders placed or performed during the hospital encounter of 04/01/17  . ED EKG  . ED EKG  . EKG 12-Lead  . EKG 12-Lead    IMPRESSION AND PLAN:    #Acute lower abdominal pain with partial obstruction probably from ileocolonic anastomosis stricture Admit to MedSurg unit N.p.o. except for sips and meds IV pain management as needed, IV antiemetics and supportive treatment Seen and evaluated by general surgery Dr. Peyton Najjar appreciate his recommendations recommending GI consult and colonoscopy prior to further surgical interventions   #History of Crohn's disease status post terminal ileocecal resection Not seen by gastroenterology for Crohn's GI consult placed and notified Dr. Alice Reichert he is aware Surgery is recommending colonoscopy prior to the surgical interventions  #Essential hypertension Continue home medication metoprolol and Norvasc and hold Cozaar   #Benign prostatic hypertrophy Continue Proscar  DVT prophylaxis with SCDs as patient is going to get colonoscopy and surgical intervention soon GI prophylaxis with Protonix IV    All the records are reviewed and case discussed with ED provider. Management plans discussed with the patient, he is in agreement.  CODE STATUS: fc   TOTAL TIME TAKING CARE OF THIS PATIENT: 8minutes.   Note: This dictation was  prepared with Dragon dictation along with smaller phrase technology. Any transcriptional errors that result from this process are unintentional.  Nicholes Mango M.D on 01/09/2019 at 6:29 PM  Between 7am to 6pm - Pager - [phone]  After 6pm go to [email or web address] - password [email or web address] [facility]  [facility]  Office  [phone]  CC: Primary care physician; Danelle Berry, NP

## 2019-01-09 NOTE — Consult Note (Signed)
SURGICAL CONSULTATION NOTE   HISTORY OF PRESENT ILLNESS (HPI):  73 y.o. male presented to [facility] ED for evaluation of abdominal pain since this morning. Patient reports having abdominal pain in the lower abdomen.  The pain does not radiate to other part of body.  There has been no alleviating or aggravating factor.  Patient reports associated nausea and vomiting.  He reports he had a bowel movement today.  Denies fever or chills. He came to ED due to the abdominal pain.  At the ED he had CT scan of the abdomen done showing chronic stricture of the ileocolonic anastomosis.  I personally evaluated the images.  I also evaluated the images of the CT scan done 2 years ago.  At that time patient reported having possible narrowing of the anastomosis.  Patient has history of Crohn disease.  He was diagnosed with Crohn's disease in a surgery that it is documented to be on 2005.  It was an incidental finding after the surgery.  Patient reports that he had a surgery due to a blockage of in his stenting and after the surgery to him that he had Crohn's.  I do have any documents from the pathology report or the surgical intervention that was done in 2005.  He does not recall the surgeon.  Patient can also recall having a gastroenterologist evaluate him.  He reports that he has not received any treatment for Crohn's before.  Currently he is not in any treatment.  He cannot recall his last colonoscopy but he is reported that it has been a while.  Surgery is consulted by Dr. Corky Downs in this context for evaluation and management of anastomosis stricture.  PAST MEDICAL HISTORY (PMH):  Past Medical History:  Diagnosis Date  . Arthritis   . BPH (benign prostatic hyperplasia)   . Crohn's disease ([place])   . Hypertension   . Kidney stones      PAST SURGICAL HISTORY [facility]):  Past Surgical History:  Procedure Laterality Date  . CHOLECYSTECTOMY    . CHOLECYSTECTOMY, LAPAROSCOPIC N/A   . COLON SURGERY     Colectomy 2005   . CYSTOSCOPY W/ RETROGRADES Bilateral 04/01/2017   Procedure: CYSTOSCOPY WITH RETROGRADE PYELOGRAM;  Surgeon: Hollice Espy, MD;  Location: [facility] OR[facility];  Service: Urology;  Laterality: Bilateral;  . CYSTOSCOPY WITH STENT PLACEMENT Bilateral 04/01/2017   Procedure: CYSTOSCOPY WITH STENT PLACEMENT;  Surgeon: Hollice Espy, MD;  Location: [facility] OR[facility];  Service: Urology;  Laterality: Bilateral;  . CYSTOSCOPY/URETEROSCOPY/HOLMIUM LASER/STENT PLACEMENT Left 10/16/2014   Procedure: CYSTOSCOPY/URETEROSCOPY/HOLMIUM LASER/STENT PLACEMENT;  Surgeon: Hollice Espy, MD;  Location: [facility] OR[facility];  Service: Urology;  Laterality: Left;  . CYSTOSCOPY/URETEROSCOPY/HOLMIUM LASER/STENT PLACEMENT Bilateral 04/20/2017   Procedure: CYSTOSCOPY/URETEROSCOPY/HOLMIUM LASER/STENT EXCHANGE;  Surgeon: Hollice Espy, MD;  Location: [facility];  Service: Urology;  Laterality: Bilateral;  . HERNIA REPAIR    . Percutaneous Left    Nephrolithotomy     MEDICATIONS:  Prior to Admission medications   Medication Sig Start Date End Date Taking? Authorizing Provider  allopurinol (ZYLOPRIM) 300 MG tablet Take 300 mg by mouth daily.   Yes [provider]  amLODipine (NORVASC) 2.5 MG tablet Take 2.5 mg by mouth daily.   Yes [provider]  finasteride (PROSCAR) 5 MG tablet Take 1 tablet (5 mg total) by mouth daily. 04/20/15  Yes Nickie Retort, MD  losartan (COZAAR) 100 MG tablet TAKE 1 TABLET BY MOUTH EVERY DAY IN THE MORNING 07/19/18  Yes [provider]  meloxicam (MOBIC) 15 MG tablet Take  15 mg by mouth daily as needed.  07/25/18  Yes [provider]  metoprolol (LOPRESSOR) 50 MG tablet Take 50 mg by mouth 2 (two) times daily.   Yes [provider]  Multiple Vitamin (MULTIVITAMIN WITH MINERALS) TABS tablet Take 1 tablet by mouth daily.   Yes [provider]  phentermine 37.5 MG capsule Take 37.5 mg by mouth every morning.   Yes [provider]  traZODone (DESYREL) 150 MG  tablet Take 150 mg by mouth at bedtime.   Yes [provider]     ALLERGIES:  Allergies  Allergen Reactions  . Ivp Dye [Iodinated Diagnostic Agents] Rash and Other (See Comments)    Pt has had CT scan since. Pt states "i'm fine"     SOCIAL HISTORY:  Social History   Socioeconomic History  . Marital status: Married    Spouse name: Not on file  . Number of children: Not on file  . Years of education: Not on file  . Highest education level: Not on file  Occupational History  . Not on file  Social Needs  . Financial resource strain: Not on file  . Food insecurity    Worry: Not on file    Inability: Not on file  . Transportation needs    Medical: Not on file    Non-medical: Not on file  Tobacco Use  . Smoking status: Never Smoker  . Smokeless tobacco: Never Used  Substance and Sexual Activity  . Alcohol use: No  . Drug use: No  . Sexual activity: Not on file  Lifestyle  . Physical activity    Days per week: Not on file    Minutes per session: Not on file  . Stress: Not on file  Relationships  . Social Herbalist on phone: Not on file    Gets together: Not on file    Attends religious service: Not on file    Active member of club or organization: Not on file    Attends meetings of clubs or organizations: Not on file    Relationship status: Not on file  . Intimate partner violence    Fear of current or ex partner: Not on file    Emotionally abused: Not on file    Physically abused: Not on file    Forced sexual activity: Not on file  Other Topics Concern  . Not on file  Social History Narrative  . Not on file    The patient currently resides (home / rehab facility / nursing home): Home The patient normally is (ambulatory / bedbound): Ambulatory   FAMILY HISTORY:  No family history on file.   REVIEW OF SYSTEMS:  Constitutional: denies weight loss, fever, chills, or sweats  Eyes: denies any other vision changes, history of eye injury  ENT:  denies sore throat, hearing problems  Respiratory: denies shortness of breath, wheezing  Cardiovascular: denies chest pain, palpitations  Gastrointestinal: Positive abdominal pain, N/V Genitourinary: denies burning with urination or urinary frequency Musculoskeletal: denies any other joint pains or cramps  Skin: denies any other rashes or skin discolorations  Neurological: denies any other headache, dizziness, weakness  Psychiatric: denies any other depression, anxiety   All other review of systems were negative   VITAL SIGNS:  Temp:  [98.4 F (36.9 C)] 98.4 F (36.9 C) (07/26 1548) Pulse Rate:  [84-104] 84 (07/26 1707) Resp:  [19-20] 19 (07/26 1707) BP: (182)/(89) 182/89 (07/26 1548) SpO2:  [88 %-97 %] 97 % (  07/26 1711) Weight:  [120.2 kg] 120.2 kg (07/26 1551)     Height: 5\' 6"  (167.6 cm) Weight: 120.2 kg BMI (Calculated): 42.79   INTAKE/OUTPUT:  This shift: No intake/output data recorded.  Last 2 shifts: @IOLAST2SHIFTS @   PHYSICAL EXAM:  Constitutional:  -- Normal body habitus  -- Awake, alert, and oriented x3  Eyes:  -- Pupils equally round and reactive to light  -- No scleral icterus  Ear, nose, and throat:  -- No jugular venous distension  Pulmonary:  -- No crackles  -- Equal breath sounds bilaterally -- Breathing non-labored at rest Cardiovascular:  -- S1, S2 present  -- No pericardial rubs Gastrointestinal:  -- Abdomen soft, nontender, non-distended, no guarding or rebound tenderness -- No abdominal masses appreciated, pulsatile or otherwise  Musculoskeletal and Integumentary:  -- Wounds or skin discoloration: None appreciated -- Extremities: B/L UE and LE FROM, hands and feet warm, no edema  Neurologic:  -- Motor function: intact and symmetric -- Sensation: intact and symmetric   Labs:  CBC Latest Ref Rng & Units 01/09/2019 04/02/2017 04/01/2017  WBC 4.0 - 10.5 K/uL 16.2(H) 13.9(H) 15.1(H)  Hemoglobin 13.0 - 17.0 g/dL 15.9 14.9 16.1  Hematocrit 39.0 -  52.0 % 46.5 44.7 46.8  Platelets 150 - 400 K/uL 211 205 210   CMP Latest Ref Rng & Units 01/09/2019 04/02/2017 04/01/2017  Glucose 70 - 99 mg/dL 124(H) 124(H) 160(H)  BUN 8 - 23 mg/dL 19 31(H) 35(H)  Creatinine 0.61 - 1.24 mg/dL 0.95 1.37(H) 1.34(H)  Sodium 135 - 145 mmol/L 138 139 137  Potassium 3.5 - 5.1 mmol/L 4.3 4.3 4.6  Chloride 98 - 111 mmol/L 104 107 104  CO2 22 - 32 mmol/L 23 24 23   Calcium 8.9 - 10.3 mg/dL 9.4 8.5(L) 9.8  Total Protein 6.5 - 8.1 g/dL 7.4 - 8.1  Total Bilirubin 0.3 - 1.2 mg/dL 0.9 - 1.1  Alkaline Phos 38 - 126 U/L 35(L) - 37(L)  AST 15 - 41 U/L 17 - 31  ALT 0 - 44 U/L 38 - 41    Imaging studies: I personally evaluated this images.  Identified the point of the previous ileocolonic stricture.  There is narrowing of that area.  The ascending colon has stool and is now decompressed.  The distal ascending colon transverse colon is decompressed.  This suggested the patient is unobstructed but stricture cannot be rule out.  EXAM: CT ABDOMEN AND PELVIS WITHOUT CONTRAST  TECHNIQUE: Multidetector CT imaging of the abdomen and pelvis was performed following the standard protocol without IV contrast.  COMPARISON:  04/01/2017  FINDINGS: Lower chest: No acute abnormality.  Coronary artery calcifications.  Hepatobiliary: No focal liver abnormality is seen. Hepatic steatosis. Status post cholecystectomy. No biliary dilatation.  Pancreas: Unremarkable. No pancreatic ductal dilatation or surrounding inflammatory changes.  Spleen: Normal in size without significant abnormality.  Adrenals/Urinary Tract: Adrenal glands are unremarkable. Atrophic appearing kidneys. Nonobstructive calculi of the inferior pole of the left kidney. Exophytic cyst of the right kidney. Bladder is unremarkable.  Stomach/Bowel: Stomach is within normal limits. The patient is status post terminal ileocecal resection. The distal small bowel is fecalized and fluid-filled without overt  distention, maximum caliber 3.2 cm. The colon is decompressed to the rectum.  Vascular/Lymphatic: Aortic atherosclerosis. No enlarged abdominal or pelvic lymph nodes.  Reproductive: No mass or other significant abnormality.  Other: Small fat containing inguinal hernias. No abdominopelvic ascites.  Musculoskeletal: No acute or significant osseous findings.  IMPRESSION: 1. The patient is status post  terminal ileocecal resection. The distal small bowel is fecalized and fluid-filled without overt distention, maximum caliber 3.2 cm. The colon is decompressed to the rectum. Findings are suspicious for stricture and partial obstruction at the anastomosis, however generally unchanged compared to prior examination dated 04/01/2017.  2.  Other chronic and incidental findings as detailed above.   Electronically Signed   By: Eddie Candle M.D.   On: 01/09/2019 16:51  Assessment/Plan:  73 y.o. male with suspected ileocolonic anastomosis stricture, complicated by pertinent comorbidities including Crohn's disease, hypertension. Patient has history of Crohn disease.  He cannot recall any treatment after for surgical management in 2005.  He cannot recall seeing a gastroenterologist and cannot recall when was the last colonoscopy.  Any patient with history of Crohn disease that had a previous stricture and surgery, there is a concern of recurrence of the disease with a new stricture.  I recommend hospitalist evaluation and management possible consult to GI for further recommendation and work-up of possible recurrence of Crohn's disease.  If this is a stricture from recurrent Crohn's patient may benefit of medical management before surgical intervention.  If current diseases rule out, surgical intervention can be considered.  It would be ideal if possible to have a colonoscopy before surgical intervention.  At this moment patient with a benign abdomen, no sign of peritonitis and no need of urgent  surgical management.  Patient oriented about this finding on the CT scan and recommendations.  We will continue to follow.  Arnold Long, MD

## 2019-01-09 NOTE — ED Notes (Signed)
Pt to remain in ED until covid results.

## 2019-01-09 NOTE — ED Triage Notes (Signed)
Pt presents to ED via POV with c/o mid abdominal pain since 0300 today. Pt states some nausea, denies V/D. Pt states hx of umbilical hernia that was oeprated on and another replaced it. Rates pain 5/10 and stabbing, states last BM was this normal and regular.

## 2019-01-09 NOTE — ED Notes (Signed)
First Nurse Note: Pt to ED c/o abd pain. Pt states that he has hx/o Crohn's diease. Pt is in NAD.

## 2019-01-10 DIAGNOSIS — R109 Unspecified abdominal pain: Secondary | ICD-10-CM

## 2019-01-10 LAB — URINALYSIS, COMPLETE (UACMP) WITH MICROSCOPIC
Bacteria, UA: NONE SEEN
Bilirubin Urine: NEGATIVE
Glucose, UA: NEGATIVE mg/dL
Hgb urine dipstick: NEGATIVE
Ketones, ur: NEGATIVE mg/dL
Leukocytes,Ua: NEGATIVE
Nitrite: NEGATIVE
Protein, ur: 100 mg/dL — AB
Specific Gravity, Urine: 1.027 (ref 1.005–1.030)
pH: 5 (ref 5.0–8.0)

## 2019-01-10 LAB — CBC
HCT: 47.3 % (ref 39.0–52.0)
Hemoglobin: 15.7 g/dL (ref 13.0–17.0)
MCH: 29.1 pg (ref 26.0–34.0)
MCHC: 33.2 g/dL (ref 30.0–36.0)
MCV: 87.8 fL (ref 80.0–100.0)
Platelets: 212 10*3/uL (ref 150–400)
RBC: 5.39 MIL/uL (ref 4.22–5.81)
RDW: 13.5 % (ref 11.5–15.5)
WBC: 17.2 10*3/uL — ABNORMAL HIGH (ref 4.0–10.5)
nRBC: 0 % (ref 0.0–0.2)

## 2019-01-10 LAB — COMPREHENSIVE METABOLIC PANEL
ALT: 42 U/L (ref 0–44)
AST: 23 U/L (ref 15–41)
Albumin: 4.1 g/dL (ref 3.5–5.0)
Alkaline Phosphatase: 33 U/L — ABNORMAL LOW (ref 38–126)
Anion gap: 12 (ref 5–15)
BUN: 23 mg/dL (ref 8–23)
CO2: 23 mmol/L (ref 22–32)
Calcium: 9 mg/dL (ref 8.9–10.3)
Chloride: 104 mmol/L (ref 98–111)
Creatinine, Ser: 0.97 mg/dL (ref 0.61–1.24)
GFR calc Af Amer: >60 mL/min (ref >60)
GFR calc non Af Amer: >60 mL/min (ref >60)
Glucose, Bld: 168 mg/dL — ABNORMAL HIGH (ref 70–99)
Potassium: 4.3 mmol/L (ref 3.5–5.1)
Sodium: 139 mmol/L (ref 135–145)
Total Bilirubin: 1.1 mg/dL (ref 0.3–1.2)
Total Protein: 7.1 g/dL (ref 6.5–8.1)

## 2019-01-10 MED ORDER — HYDRALAZINE HCL 20 MG/ML IJ SOLN: 10.0000 mg | Freq: Four times a day (QID) | INTRAMUSCULAR | Status: DC | PRN

## 2019-01-10 MED ORDER — SODIUM CHLORIDE 0.9 % IV SOLN
INTRAVENOUS | Status: AC
  Administered 2019-01-11 – 2019-01-12 (×3): via INTRAVENOUS

## 2019-01-10 MED ORDER — MORPHINE SULFATE (PF) 4 MG/ML IV SOLN
4.0000 mg | INTRAVENOUS | Status: DC | PRN
  Administered 2019-01-10 – 2019-01-14 (×4): 4 mg via INTRAVENOUS
  Filled 2019-01-10 (×4): qty 1

## 2019-01-10 MED ORDER — PEG 3350-KCL-NA BICARB-NACL 420 G PO SOLR
4000.0000 mL | Freq: Once | ORAL | Status: AC
  Administered 2019-01-10: 4000 mL via ORAL
  Filled 2019-01-10: qty 4000

## 2019-01-10 NOTE — Progress Notes (Signed)
[facility] NAME: Isaiah Walker    MR#:  [id number]  DATE OF BIRTH:  25-Jun-1945  SUBJECTIVE:  CHIEF COMPLAINT:   Chief Complaint  Patient presents with  . Abdominal Pain   The patient still has abdominal pain and distention.  No passing gas or bowel movement. REVIEW OF SYSTEMS:  Review of Systems  Constitutional: Negative for chills, fever and malaise/fatigue.  HENT: Negative for sore throat.   Eyes: Negative for blurred vision and double vision.  Respiratory: Negative for cough, hemoptysis, shortness of breath, wheezing and stridor.   Cardiovascular: Negative for chest pain, palpitations, orthopnea and leg swelling.  Gastrointestinal: Positive for abdominal pain, constipation and nausea. Negative for blood in stool, diarrhea, melena and vomiting.  Genitourinary: Negative for dysuria, flank pain and hematuria.  Musculoskeletal: Negative for back pain and joint pain.  Skin: Negative for rash.  Neurological: Negative for dizziness, sensory change, focal weakness, seizures, loss of consciousness, weakness and headaches.  Endo/Heme/Allergies: Negative for polydipsia.  Psychiatric/Behavioral: Negative for depression. The patient is not nervous/anxious.     DRUG ALLERGIES:   Allergies  Allergen Reactions  . Ivp Dye [Iodinated Diagnostic Agents] Rash and Other (See Comments)    Pt has had CT scan since. Pt states "i'm fine"   VITALS:  Blood pressure (!) 154/84, pulse 76, temperature 98 F (36.7 C), temperature source Oral, resp. rate 18, height 5\' 6"  (1.676 m), weight 122.5 kg, SpO2 94 %. PHYSICAL EXAMINATION:  Physical Exam Constitutional:      General: He is not in acute distress.    Appearance: He is obese.  HENT:     Head: Normocephalic.     Mouth/Throat:     Mouth: Mucous membranes are moist.  Eyes:     General: No scleral icterus.    Conjunctiva/sclera: Conjunctivae normal.     Pupils: Pupils are equal, round, and  reactive to light.  Neck:     Musculoskeletal: Normal range of motion and neck supple.     Vascular: No JVD.     Trachea: No tracheal deviation.  Cardiovascular:     Rate and Rhythm: Normal rate and regular rhythm.     Heart sounds: Normal heart sounds. No murmur. No gallop.   Pulmonary:     Effort: Pulmonary effort is normal. No respiratory distress.     Breath sounds: Normal breath sounds. No wheezing or rales.  Abdominal:     General: Bowel sounds are normal. There is distension.     Palpations: Abdomen is soft.     Tenderness: There is abdominal tenderness. There is no rebound.  Musculoskeletal: Normal range of motion.        General: No tenderness.     Right lower leg: No edema.     Left lower leg: No edema.  Skin:    Findings: No erythema or rash.  Neurological:     Mental Status: He is alert and oriented to person, place, and time.     Cranial Nerves: No cranial nerve deficit.  Psychiatric:        Mood and Affect: Mood normal.    LABORATORY PANEL:  Male CBC Recent Labs  Lab 01/10/19 0358  WBC 17.2*  HGB 15.7  HCT 47.3  PLT 212   ------------------------------------------------------------------------------------------------------------------ Chemistries  Recent Labs  Lab 01/10/19 0358  NA 139  K 4.3  CL 104  CO2 23  GLUCOSE 168*  BUN 23  CREATININE 0.97  CALCIUM 9.0  AST 23  ALT 42  ALKPHOS 33*  BILITOT 1.1   RADIOLOGY:  Ct Abdomen Pelvis Wo Contrast  Result Date: 01/09/2019 CLINICAL DATA:  Abdominal distension, mid abdominal pain, nausea, history of umbilical hernia EXAM: CT ABDOMEN AND PELVIS WITHOUT CONTRAST TECHNIQUE: Multidetector CT imaging of the abdomen and pelvis was performed following the standard protocol without IV contrast. COMPARISON:  04/01/2017 FINDINGS: Lower chest: No acute abnormality.  Coronary artery calcifications. Hepatobiliary: No focal liver abnormality is seen. Hepatic steatosis. Status post cholecystectomy. No biliary  dilatation. Pancreas: Unremarkable. No pancreatic ductal dilatation or surrounding inflammatory changes. Spleen: Normal in size without significant abnormality. Adrenals/Urinary Tract: Adrenal glands are unremarkable. Atrophic appearing kidneys. Nonobstructive calculi of the inferior pole of the left kidney. Exophytic cyst of the right kidney. Bladder is unremarkable. Stomach/Bowel: Stomach is within normal limits. The patient is status post terminal ileocecal resection. The distal small bowel is fecalized and fluid-filled without overt distention, maximum caliber 3.2 cm. The colon is decompressed to the rectum. Vascular/Lymphatic: Aortic atherosclerosis. No enlarged abdominal or pelvic lymph nodes. Reproductive: No mass or other significant abnormality. Other: Small fat containing inguinal hernias. No abdominopelvic ascites. Musculoskeletal: No acute or significant osseous findings. IMPRESSION: 1. The patient is status post terminal ileocecal resection. The distal small bowel is fecalized and fluid-filled without overt distention, maximum caliber 3.2 cm. The colon is decompressed to the rectum. Findings are suspicious for stricture and partial obstruction at the anastomosis, however generally unchanged compared to prior examination dated 04/01/2017. 2.  Other chronic and incidental findings as detailed above. Electronically Signed   By: Eddie Candle M.D.   On: 01/09/2019 16:51   ASSESSMENT AND PLAN:   #Acute lower abdominal pain with partial obstruction probably from ileocolonic anastomosis stricture Clear liquid diet. IV pain management as needed, IV antiemetics and supportive treatment Dr. Peyton Najjar recommends GI consult and colonoscopy prior to further surgical interventions. Colonoscopy tomorrow per Dr. Bonna Gains.  #History of Crohn's disease status post terminal ileocecal resection Not seen by gastroenterology for Crohn's Colonoscopy per Dr. Bonna Gains.  #Essential hypertension Continue home  medication metoprolol and Norvasc and hold Cozaar  #Benign prostatic hypertrophy Continue Proscar  Mild dehydration.  IV fluid support and follow-up BMP. Leukocytosis.  Possible due to reaction secondary to above.  Follow-up CBC. I called his spouse, Mrs. Cellucci, nobody answered the phone. All the records are reviewed and case discussed with Care Management/Social Worker. Management plans discussed with the patient, family and they are in agreement.  CODE STATUS: Full Code  TOTAL TIME TAKING CARE OF THIS PATIENT: 32 minutes.   More than 50% of the time was spent in counseling/coordination of care: YES  POSSIBLE D/C IN 3 DAYS, DEPENDING ON CLINICAL CONDITION.   Demetrios Loll M.D on 01/10/2019 at 3:04 PM  Between 7am to 6pm - Pager - [phone]  After 6pm go to [email or web address] - [facility]

## 2019-01-10 NOTE — Consult Note (Signed)
Vonda Antigua, MD [address] [place], [place], [place], [address] [address], [place], [place], [place], [address] Phone: [phone]  Fax: [phone]  Consultation  Referring Provider:     Dr. Bridgett Larsson Primary Care Physician:  Danelle Berry, NP Reason for Consultation:     History of Crohn's disease  Date of Admission:  01/09/2019 Date of Consultation:  01/10/2019         HPI:   Isaiah Walker is a 73 y.o. male who presents with abdominal pain, bilateral lower quadrant going on for 1 day, with reported history of Crohn's disease 20 years ago.  CT showed history of right hemicolectomy/terminal ileocecal resection with distal small bowel with fecalization and fluid-filled small bowel without overt distention.  However, reports states colon is decompressed to the rectum and generally unchanged CT scan compared to prior CT scan dated October 2018.  Patient denies any diarrhea or constipation prior to the admission.  States usually does not pass much gas and has not had any flatus since admission.  Continues to report bilateral lower quadrant abdominal pain.  Reports nausea but no vomiting.  Denies any blood in stool or melena.  Surgery has evaluated the patient but is requesting colonoscopy prior to any surgical intervention.  Patient's history of Crohn's disease is questionable at best.  He does not follow-up with a gastroenterologist.  He states he was diagnosed with Crohn's as he presented 20 years ago with abdominal pain and had to undergo emergency surgery.  After the procedure he was placed on oral medications which she describes as four pills a day but does not know the name of the medication.  He did not have a colonoscopy at the time.  He did well post procedure and did not have any symptoms.  He states 10 years ago he had a colonoscopy across from [facility] and it was normal.  We do not have any of his previous records.  I called medical records and they state  they do not have records prior to 11 years ago.     Past Medical History:  Diagnosis Date  . Arthritis   . BPH (benign prostatic hyperplasia)   . Crohn's disease ([place])   . Hypertension   . Kidney stones     Past Surgical History:  Procedure Laterality Date  . CHOLECYSTECTOMY    . CHOLECYSTECTOMY, LAPAROSCOPIC N/A   . COLON SURGERY     Colectomy 2005  . CYSTOSCOPY W/ RETROGRADES Bilateral 04/01/2017   Procedure: CYSTOSCOPY WITH RETROGRADE PYELOGRAM;  Surgeon: Hollice Espy, MD;  Location: [facility];  Service: Urology;  Laterality: Bilateral;  . CYSTOSCOPY WITH STENT PLACEMENT Bilateral 04/01/2017   Procedure: CYSTOSCOPY WITH STENT PLACEMENT;  Surgeon: Hollice Espy, MD;  Location: [facility];  Service: Urology;  Laterality: Bilateral;  . CYSTOSCOPY/URETEROSCOPY/HOLMIUM LASER/STENT PLACEMENT Left 10/16/2014   Procedure: CYSTOSCOPY/URETEROSCOPY/HOLMIUM LASER/STENT PLACEMENT;  Surgeon: Hollice Espy, MD;  Location: [facility];  Service: Urology;  Laterality: Left;  . CYSTOSCOPY/URETEROSCOPY/HOLMIUM LASER/STENT PLACEMENT Bilateral 04/20/2017   Procedure: CYSTOSCOPY/URETEROSCOPY/HOLMIUM LASER/STENT EXCHANGE;  Surgeon: Hollice Espy, MD;  Location: [facility];  Service: Urology;  Laterality: Bilateral;  . HERNIA REPAIR    . Percutaneous Left    Nephrolithotomy    Prior to Admission medications   Medication Sig Start Date End Date Taking? Authorizing Provider  allopurinol (ZYLOPRIM) 300 MG tablet Take 300 mg by mouth daily.   Yes [provider]  amLODipine (NORVASC) 2.5 MG tablet Take 2.5 mg by mouth  daily.   Yes [provider]  finasteride (PROSCAR) 5 MG tablet Take 1 tablet (5 mg total) by mouth daily. 04/20/15  Yes Nickie Retort, MD  losartan (COZAAR) 100 MG tablet TAKE 1 TABLET BY MOUTH EVERY DAY IN THE MORNING 07/19/18  Yes [provider]  meloxicam (MOBIC) 15 MG tablet Take 15 mg by mouth daily as needed.  07/25/18  Yes [provider]   metoprolol (LOPRESSOR) 50 MG tablet Take 50 mg by mouth 2 (two) times daily.   Yes [provider]  Multiple Vitamin (MULTIVITAMIN WITH MINERALS) TABS tablet Take 1 tablet by mouth daily.   Yes [provider]  phentermine 37.5 MG capsule Take 37.5 mg by mouth every morning.   Yes [provider]  traZODone (DESYREL) 150 MG tablet Take 150 mg by mouth at bedtime.   Yes [provider]    History reviewed. No pertinent family history.   Social History   Tobacco Use  . Smoking status: Never Smoker  . Smokeless tobacco: Never Used  Substance Use Topics  . Alcohol use: No  . Drug use: No    Allergies as of 01/09/2019 - Review Complete 01/09/2019  Allergen Reaction Noted  . Ivp dye [iodinated diagnostic agents] Rash and Other (See Comments) 10/09/2014    Review of Systems:    All systems reviewed and negative except where noted in HPI.   Physical Exam:  Vital signs in last 24 hours: Vitals:   01/09/19 1920 01/09/19 2003 01/09/19 2200 01/10/19 0631  BP: (!) 165/75 (!) 157/91 (!) 160/77 (!) 169/81  Pulse: 73 74 73 81  Resp: 20 18  18   Temp:   97.7 F (36.5 C) 98.3 F (36.8 C)  TempSrc:   Oral Oral  SpO2: 94% 99% 96% 93%  Weight:  122.5 kg    Height:  5\' 6"  (1.676 m)     Last BM Date: 01/09/19 General:   Pleasant, cooperative in NAD Head:  Normocephalic and atraumatic. Eyes:   No icterus.   Conjunctiva pink. PERRLA. Ears:  Normal auditory acuity. Neck:  Supple; no masses or thyroidomegaly Lungs: Respirations even and unlabored. Lungs clear to auscultation bilaterally.   No wheezes, crackles, or rhonchi.  Abdomen:  Soft, nondistended, nontender. Normal bowel sounds. No appreciable masses or hepatomegaly.  No rebound or guarding.  Neurologic:  Alert and oriented x3;  grossly normal neurologically. Skin:  Intact without significant lesions or rashes. Cervical Nodes:  No significant cervical adenopathy. Psych:  Alert and cooperative. Normal  affect.  LAB RESULTS: Recent Labs    01/09/19 1549 01/10/19 0358  WBC 16.2* 17.2*  HGB 15.9 15.7  HCT 46.5 47.3  PLT 211 212   BMET Recent Labs    01/09/19 1549 01/10/19 0358  NA 138 139  K 4.3 4.3  CL 104 104  CO2 23 23  GLUCOSE 124* 168*  BUN 19 23  CREATININE 0.95 0.97  CALCIUM 9.4 9.0   LFT Recent Labs    01/10/19 0358  PROT 7.1  ALBUMIN 4.1  AST 23  ALT 42  ALKPHOS 33*  BILITOT 1.1   PT/INR No results for input(s): LABPROT, INR in the last 72 hours.  STUDIES: Ct Abdomen Pelvis Wo Contrast  Result Date: 01/09/2019 CLINICAL DATA:  Abdominal distension, mid abdominal pain, nausea, history of umbilical hernia EXAM: CT ABDOMEN AND PELVIS WITHOUT CONTRAST TECHNIQUE: Multidetector CT imaging of the abdomen and pelvis was performed following the standard protocol without IV contrast. COMPARISON:  04/01/2017 FINDINGS: Lower chest: No acute abnormality.  Coronary artery calcifications. Hepatobiliary: No focal liver abnormality is seen. Hepatic steatosis. Status post cholecystectomy. No biliary dilatation. Pancreas: Unremarkable. No pancreatic ductal dilatation or surrounding inflammatory changes. Spleen: Normal in size without significant abnormality. Adrenals/Urinary Tract: Adrenal glands are unremarkable. Atrophic appearing kidneys. Nonobstructive calculi of the inferior pole of the left kidney. Exophytic cyst of the right kidney. Bladder is unremarkable. Stomach/Bowel: Stomach is within normal limits. The patient is status post terminal ileocecal resection. The distal small bowel is fecalized and fluid-filled without overt distention, maximum caliber 3.2 cm. The colon is decompressed to the rectum. Vascular/Lymphatic: Aortic atherosclerosis. No enlarged abdominal or pelvic lymph nodes. Reproductive: No mass or other significant abnormality. Other: Small fat containing inguinal hernias. No abdominopelvic ascites. Musculoskeletal: No acute or significant osseous findings.  IMPRESSION: 1. The patient is status post terminal ileocecal resection. The distal small bowel is fecalized and fluid-filled without overt distention, maximum caliber 3.2 cm. The colon is decompressed to the rectum. Findings are suspicious for stricture and partial obstruction at the anastomosis, however generally unchanged compared to prior examination dated 04/01/2017. 2.  Other chronic and incidental findings as detailed above. Electronically Signed   By: Eddie Candle M.D.   On: 01/09/2019 16:51      Impression / Plan:   Isaiah Walker is a 73 y.o. y/o male with questionable history of Crohn's disease, admitted with abdominal pain  I would not expect Crohn's disease to remain quiescent without medications for this many years since diagnosis.   However, if it was limited to the neoterminal ileum and that is the site that was removed during his surgery, this is possible.  Since I do not have any of his previous records, I cannot confirm what the real diagnoses was in the past.  I have requested Dr. Bridgett Larsson to try to send records request to the medical records department downstairs to see if they can look into obtaining previous records.  I do not see any obvious contraindications to proceeding with colonoscopy in this patient.  I personally reviewed the CT images and the report and there is no obvious distention of the small bowel or colon.  A colonoscopy would allow for evaluation of the anastomosis site to rule out stricture at the site.  however, if patient clinically worsens, prior to colonoscopy, surgical intervention would be recommended.  We will order colonoscopy prep today.  Patient does not have any emesis, and therefore should be able to tolerate his prep.  Slow prep today with colonoscopy tomorrow  If continue close monitoring and daily abdominal exams.  Please page surgery if patient's condition acutely worsens.  Low threshold to page surgery with any changes in abdominal pain or  clinical status  I have discussed alternative options, risks & benefits,  which include, but are not limited to, bleeding, infection, perforation,respiratory complication & drug reaction.  The patient agrees with this plan & written consent will be obtained.     Thank you for involving me in the care of this patient.      LOS: 1 day   Virgel Manifold, MD  01/10/2019, 11:56 AM

## 2019-01-10 NOTE — Consult Note (Signed)
[facility] Hospital Day(s): 1.   Post op day(s):  Marland Kitchen   Interval History: Patient seen and examined, no acute events or new complaints overnight. Patient reports continued with lower abdominal pain.  He denies passing gas or stool.  He denies nausea or vomiting.  There is no pain radiation.  There is no alleviating or aggravating factor.  Denies fever or chills.  Vital signs in last 24 hours: [min-max] current  Temp:  [97.7 F (36.5 C)-98.4 F (36.9 C)] 98.3 F (36.8 C) (07/27 0631) Pulse Rate:  [73-104] 81 (07/27 0631) Resp:  [13-20] 18 (07/27 0631) BP: (150-182)/(75-91) 169/81 (07/27 0631) SpO2:  [88 %-99 %] 93 % (07/27 0631) Weight:  [120.2 kg-122.5 kg] 122.5 kg (07/26 2003)     Height: 5\' 6"  (167.6 cm) Weight: 122.5 kg BMI (Calculated): 43.61   Physical Exam:  Constitutional: alert, cooperative and no distress  Respiratory: breathing non-labored at rest  Cardiovascular: regular rate and sinus rhythm  Gastrointestinal: soft, mild-tender, and non-distended  Labs:  CBC Latest Ref Rng & Units 01/10/2019 01/09/2019 04/02/2017  WBC 4.0 - 10.5 K/uL 17.2(H) 16.2(H) 13.9(H)  Hemoglobin 13.0 - 17.0 g/dL 15.7 15.9 14.9  Hematocrit 39.0 - 52.0 % 47.3 46.5 44.7  Platelets 150 - 400 K/uL 212 211 205   CMP Latest Ref Rng & Units 01/10/2019 01/09/2019 04/02/2017  Glucose 70 - 99 mg/dL 168(H) 124(H) 124(H)  BUN 8 - 23 mg/dL 23 19 31(H)  Creatinine 0.61 - 1.24 mg/dL 0.97 0.95 1.37(H)  Sodium 135 - 145 mmol/L 139 138 139  Potassium 3.5 - 5.1 mmol/L 4.3 4.3 4.3  Chloride 98 - 111 mmol/L 104 104 107  CO2 22 - 32 mmol/L 23 23 24   Calcium 8.9 - 10.3 mg/dL 9.0 9.4 8.5(L)  Total Protein 6.5 - 8.1 g/dL 7.1 7.4 -  Total Bilirubin 0.3 - 1.2 mg/dL 1.1 0.9 -  Alkaline Phos 38 - 126 U/L 33(L) 35(L) -  AST 15 - 41 U/L 23 17 -  ALT 0 - 44 U/L 42 38 -    Imaging studies: No new pertinent imaging studies   Assessment/Plan:  73 y.o. male with suspected anastomotic stricture,  complicated by pertinent comorbidities including history of Crohn's disease, hypertension. Patient without improvement abdominal pain.  Difficult physical exam due to obesity.  There is no sign of peritonitis.  I considered that patient should benefit of gastroenterology evaluation before making any surgical decision.  We will appreciated GI evaluation and recommendation for the possibility of recurrence of Crohn's.  After this evaluation and final assessment, will discuss with patient if surgical management will be needed in this admission.  If patient proceed with the abdominal pain and unable to pass gas, might need to consider exploratory laparotomy with possible right colectomy.  If Crohn's is diagnosed, will discuss with GI if medical management will help to decrease the stricture and resolve his problem.   Arnold Long, MD

## 2019-01-10 NOTE — Progress Notes (Signed)
Dr. Posey Pronto notified of IVF stop order; NPO for colonoscopy in am; Acknowledged; New order for present IVF to run at 81ml/hr. Barbaraann Faster, RN 11:56 PM 01/10/2019

## 2019-01-11 ENCOUNTER — Inpatient Hospital Stay: Payer: Medicare HMO | Admitting: Anesthesiology

## 2019-01-11 ENCOUNTER — Encounter
Admission: EM | Disposition: A | Discharge status: Home / Self Care | Payer: Self-pay | Source: Home / Self Care | Attending: Internal Medicine

## 2019-01-11 DIAGNOSIS — K913 Postprocedural intestinal obstruction, unspecified as to partial versus complete: Secondary | ICD-10-CM

## 2019-01-11 DIAGNOSIS — Z9049 Acquired absence of other specified parts of digestive tract: Secondary | ICD-10-CM

## 2019-01-11 DIAGNOSIS — K621 Rectal polyp: Secondary | ICD-10-CM

## 2019-01-11 DIAGNOSIS — K56699 Other intestinal obstruction unspecified as to partial versus complete obstruction: Secondary | ICD-10-CM

## 2019-01-11 HISTORY — PX: COLONOSCOPY: SHX5424

## 2019-01-11 LAB — CBC
HCT: 46.8 % (ref 39.0–52.0)
Hemoglobin: 15.3 g/dL (ref 13.0–17.0)
MCH: 29.7 pg (ref 26.0–34.0)
MCHC: 32.7 g/dL (ref 30.0–36.0)
MCV: 90.7 fL (ref 80.0–100.0)
Platelets: 215 10*3/uL (ref 150–400)
RBC: 5.16 MIL/uL (ref 4.22–5.81)
RDW: 13.9 % (ref 11.5–15.5)
WBC: 8.6 10*3/uL (ref 4.0–10.5)
nRBC: 0 % (ref 0.0–0.2)

## 2019-01-11 LAB — BASIC METABOLIC PANEL
Anion gap: 11 (ref 5–15)
BUN: 36 mg/dL — ABNORMAL HIGH (ref 8–23)
CO2: 27 mmol/L (ref 22–32)
Calcium: 8.5 mg/dL — ABNORMAL LOW (ref 8.9–10.3)
Chloride: 101 mmol/L (ref 98–111)
Creatinine, Ser: 1.15 mg/dL (ref 0.61–1.24)
GFR calc Af Amer: >60 mL/min (ref >60)
GFR calc non Af Amer: >60 mL/min (ref >60)
Glucose, Bld: 143 mg/dL — ABNORMAL HIGH (ref 70–99)
Potassium: 3.9 mmol/L (ref 3.5–5.1)
Sodium: 139 mmol/L (ref 135–145)

## 2019-01-11 LAB — MAGNESIUM: Magnesium: 2 mg/dL (ref 1.7–2.4)

## 2019-01-11 SURGERY — COLONOSCOPY
Anesthesia: General

## 2019-01-11 MED ORDER — PROPOFOL 500 MG/50ML IV EMUL
INTRAVENOUS | Status: DC | PRN
  Administered 2019-01-11: 140 ug/kg/min via INTRAVENOUS

## 2019-01-11 MED ORDER — LIDOCAINE HCL (PF) 2 % IJ SOLN
INTRAMUSCULAR | Status: AC
  Filled 2019-01-11: qty 20

## 2019-01-11 MED ORDER — PROPOFOL 500 MG/50ML IV EMUL
INTRAVENOUS | Status: AC
  Filled 2019-01-11: qty 100

## 2019-01-11 MED ORDER — GLYCOPYRROLATE 0.2 MG/ML IJ SOLN
INTRAMUSCULAR | Status: AC
  Filled 2019-01-11: qty 3

## 2019-01-11 MED ORDER — PROPOFOL 10 MG/ML IV BOLUS
INTRAVENOUS | Status: DC | PRN
  Administered 2019-01-11: 80 mg via INTRAVENOUS

## 2019-01-11 MED ORDER — PHENYLEPHRINE HCL (PRESSORS) 10 MG/ML IV SOLN
INTRAVENOUS | Status: DC | PRN
  Administered 2019-01-11: 100 ug via INTRAVENOUS
  Administered 2019-01-11 (×2): 200 ug via INTRAVENOUS

## 2019-01-11 NOTE — Anesthesia Preprocedure Evaluation (Signed)
Anesthesia Evaluation  Patient identified by MRN, date of birth, ID band Patient awake    Reviewed: Allergy & Precautions, NPO status , Patient's Chart, lab work & pertinent test results  History of Anesthesia Complications Negative for: history of anesthetic complications  Airway Mallampati: II  TM Distance: >3 FB Neck ROM: Full    Dental  (+) Poor Dentition   Pulmonary neg pulmonary ROS, neg sleep apnea, neg COPD,    breath sounds clear to auscultation- rhonchi (-) wheezing      Cardiovascular hypertension, Pt. on medications (-) CAD, (-) Past MI, (-) Cardiac Stents and (-) CABG  Rhythm:Regular Rate:Normal - Systolic murmurs and - Diastolic murmurs    Neuro/Psych neg Seizures negative neurological ROS  negative psych ROS   GI/Hepatic negative GI ROS, Neg liver ROS,   Endo/Other  negative endocrine ROSneg diabetes  Renal/GU Renal disease: hx of nephrolithiasis.     Musculoskeletal  (+) Arthritis ,   Abdominal (+) + obese,   Peds  Hematology negative hematology ROS (+)   Anesthesia Other Findings Past Medical History: No date: Arthritis No date: BPH (benign prostatic hyperplasia) No date: Crohn's disease ([facility]) No date: Hypertension No date: Kidney stones   Reproductive/Obstetrics                             Anesthesia Physical Anesthesia Plan  ASA: II  Anesthesia Plan: General   Post-op Pain Management:    Induction: Intravenous  PONV Risk Score and Plan: 1 and Propofol infusion  Airway Management Planned: Natural Airway  Additional Equipment:   Intra-op Plan:   Post-operative Plan:   Informed Consent: I have reviewed the patients History and Physical, chart, labs and discussed the procedure including the risks, benefits and alternatives for the proposed anesthesia with the patient or authorized representative who has indicated his/her understanding and acceptance.      Dental advisory given  Plan Discussed with: CRNA and Anesthesiologist  Anesthesia Plan Comments:         Anesthesia Quick Evaluation

## 2019-01-11 NOTE — Progress Notes (Signed)
Vonda Antigua, MD [address], [place], [place], [place], [address] [address], [place], [place], [place], [address] Phone: [phone]  Fax: [phone]   Subjective:  Patient completed his prep.  Reports abdominal pain has improved today.  No nausea or vomiting with the prep.  No blood in stool.  Objective: Exam: Vital signs in last 24 hours: Vitals:   01/10/19 2118 01/11/19 0131 01/11/19 0400 01/11/19 1200  BP: (!) 165/83 127/64 (!) 146/76 (!) 141/71  Pulse: 74 70 74 72  Resp: 18 18 18    Temp: (!) 97.5 F (36.4 C) 97.7 F (36.5 C) 97.6 F (36.4 C) (!) 97 F (36.1 C)  TempSrc: Oral Oral Oral Tympanic  SpO2: 95% 93% 92% 97%  Weight:      Height:       Weight change:   Intake/Output Summary (Last 24 hours) at 01/11/2019 1201 Last data filed at 01/11/2019 6226 Gross per 24 hour  Intake 2935.45 ml  Output 950 ml  Net 1985.45 ml    General: No acute distress, AAO x3 Abd: Soft, NT/ND, No HSM Skin: Warm, no rashes Neck: Supple, Trachea midline   Lab Results: Lab Results  Component Value Date   WBC 8.6 01/11/2019   HGB 15.3 01/11/2019   HCT 46.8 01/11/2019   MCV 90.7 01/11/2019   PLT 215 01/11/2019   Micro Results: Recent Results (from the past 240 hour(s))  SARS Coronavirus 2 (CEPHEID - Performed in [facility] hospital lab), Hosp Order     Status: None   Collection Time: 01/09/19  6:31 PM   Specimen: Nasopharyngeal Swab  Result Value Ref Range Status   SARS Coronavirus 2 NEGATIVE NEGATIVE Final    Comment: (NOTE) If result is NEGATIVE SARS-CoV-2 target nucleic acids are NOT DETECTED. The SARS-CoV-2 RNA is generally detectable in upper and lower  respiratory specimens during the acute phase of infection. The lowest  concentration of SARS-CoV-2 viral copies this assay can detect is 250  copies / mL. A negative result does not preclude SARS-CoV-2 infection  and should not be used as the sole basis for treatment or other  patient management  decisions.  A negative result may occur with  improper specimen collection / handling, submission of specimen other  than nasopharyngeal swab, presence of viral mutation(s) within the  areas targeted by this assay, and inadequate number of viral copies  (<250 copies / mL). A negative result must be combined with clinical  observations, patient history, and epidemiological information. If result is POSITIVE SARS-CoV-2 target nucleic acids are DETECTED. The SARS-CoV-2 RNA is generally detectable in upper and lower  respiratory specimens dur ing the acute phase of infection.  Positive  results are indicative of active infection with SARS-CoV-2.  Clinical  correlation with patient history and other diagnostic information is  necessary to determine patient infection status.  Positive results do  not rule out bacterial infection or co-infection with other viruses. If result is PRESUMPTIVE POSTIVE SARS-CoV-2 nucleic acids MAY BE PRESENT.   A presumptive positive result was obtained on the submitted specimen  and confirmed on repeat testing.  While 2019 novel coronavirus  (SARS-CoV-2) nucleic acids may be present in the submitted sample  additional confirmatory testing may be necessary for epidemiological  and / or clinical management purposes  to differentiate between  SARS-CoV-2 and other Sarbecovirus currently known to infect humans.  If clinically indicated additional testing with an alternate test  methodology [phone]) is advised. The SARS-CoV-2 RNA is generally  detectable in upper and lower respiratory sp ecimens during the acute  phase of infection. The expected result is Negative. Fact Sheet for Patients:  StrictlyIdeas.no Fact Sheet for Healthcare Providers: BankingDealers.co.za This test is not yet approved or cleared by the [place] FDA and has been authorized for detection and/or diagnosis of SARS-CoV-2 by FDA under an  Emergency Use Authorization (EUA).  This EUA will remain in effect (meaning this test can be used) for the duration of the COVID-19 declaration under Section 564(b)(1) of the Act, 21 U.S.C. section 360bbb-3(b)(1), unless the authorization is terminated or revoked sooner. Performed at [facility], [address] [place], [place] [address]    Studies/Results: Ct Abdomen Pelvis Wo Contrast  Result Date: 01/09/2019 CLINICAL DATA:  Abdominal distension, mid abdominal pain, nausea, history of umbilical hernia EXAM: CT ABDOMEN AND PELVIS WITHOUT CONTRAST TECHNIQUE: Multidetector CT imaging of the abdomen and pelvis was performed following the standard protocol without IV contrast. COMPARISON:  04/01/2017 FINDINGS: Lower chest: No acute abnormality.  Coronary artery calcifications. Hepatobiliary: No focal liver abnormality is seen. Hepatic steatosis. Status post cholecystectomy. No biliary dilatation. Pancreas: Unremarkable. No pancreatic ductal dilatation or surrounding inflammatory changes. Spleen: Normal in size without significant abnormality. Adrenals/Urinary Tract: Adrenal glands are unremarkable. Atrophic appearing kidneys. Nonobstructive calculi of the inferior pole of the left kidney. Exophytic cyst of the right kidney. Bladder is unremarkable. Stomach/Bowel: Stomach is within normal limits. The patient is status post terminal ileocecal resection. The distal small bowel is fecalized and fluid-filled without overt distention, maximum caliber 3.2 cm. The colon is decompressed to the rectum. Vascular/Lymphatic: Aortic atherosclerosis. No enlarged abdominal or pelvic lymph nodes. Reproductive: No mass or other significant abnormality. Other: Small fat containing inguinal hernias. No abdominopelvic ascites. Musculoskeletal: No acute or significant osseous findings. IMPRESSION: 1. The patient is status post terminal ileocecal resection. The distal small bowel is fecalized and fluid-filled without  overt distention, maximum caliber 3.2 cm. The colon is decompressed to the rectum. Findings are suspicious for stricture and partial obstruction at the anastomosis, however generally unchanged compared to prior examination dated 04/01/2017. 2.  Other chronic and incidental findings as detailed above. Electronically Signed   By: Eddie Candle M.D.   On: 01/09/2019 16:51   Medications:  Scheduled Meds:  [MAR Hold] amLODipine  2.5 mg Oral Daily   [MAR Hold] finasteride  5 mg Oral Daily   [MAR Hold] metoprolol tartrate  50 mg Oral BID   [MAR Hold] pantoprazole (PROTONIX) IV  40 mg Intravenous Q24H   [MAR Hold] sodium chloride flush  3 mL Intravenous Once   [MAR Hold] traZODone  150 mg Oral QHS   Continuous Infusions:  sodium chloride 50 mL/hr at 01/11/19 0514   PRN Meds:.[MAR Hold] hydrALAZINE, [MAR Hold] metoprolol tartrate, [MAR Hold]  morphine injection, [MAR Hold] ondansetron **OR** [MAR Hold] ondansetron (ZOFRAN) IV, [MAR Hold] prochlorperazine, [MAR Hold] traMADol   Assessment: Active Problems:   Acute abdominal pain    Plan: Can proceed with colonoscopy today for further evaluation of abdominal pain and rule out any strictures emesis site  I have discussed alternative options, risks & benefits,  which include, but are not limited to, bleeding, infection, perforation,respiratory complication & drug reaction.  The patient agrees with this plan & written consent will be obtained.    Continue n.p.o. until after procedure please see procedure report for details after procedure today   LOS: 2 days   Vonda Antigua, MD 01/11/2019, 12:01 PM

## 2019-01-11 NOTE — OR Nursing (Signed)
TRANSFERRED BACK TO [facility]. S/P COLONOSCOPY . REPORT TO PRIMARY RN. TOLERATING CLEAR LIQUIDS

## 2019-01-11 NOTE — Progress Notes (Signed)
[facility] Day(s): 2.   Post op day(s): Day of Surgery.   Interval History: Patient seen and examined, no acute events or new complaints overnight. Patient reports feeling better after being able to move his bowels yesterday.  Patient had bowel prep.  This morning with improved abdominal pain.  There was some nausea with the bowel prep.  There is no fever or chills.  Vital signs in last 24 hours: [min-max] current  Temp:  [97 F (36.1 C)-97.7 F (36.5 C)] 97.4 F (36.3 C) (07/28 1300) Pulse Rate:  [65-74] 65 (07/28 1300) Resp:  [13-18] 13 (07/28 1300) BP: (113-165)/(52-83) 113/52 (07/28 1300) SpO2:  [92 %-97 %] 97 % (07/28 1300)     Height: 5\' 6"  (167.6 cm) Weight: 122.5 kg BMI (Calculated): 43.61   Physical Exam:  Constitutional: alert, cooperative and no distress  Respiratory: breathing non-labored at rest  Cardiovascular: regular rate and sinus rhythm  Gastrointestinal: soft, non-tender, and non-distended  Labs:  CBC Latest Ref Rng & Units 01/11/2019 01/10/2019 01/09/2019  WBC 4.0 - 10.5 K/uL 8.6 17.2(H) 16.2(H)  Hemoglobin 13.0 - 17.0 g/dL 15.3 15.7 15.9  Hematocrit 39.0 - 52.0 % 46.8 47.3 46.5  Platelets 150 - 400 K/uL 215 212 211   CMP Latest Ref Rng & Units 01/11/2019 01/10/2019 01/09/2019  Glucose 70 - 99 mg/dL 143(H) 168(H) 124(H)  BUN 8 - 23 mg/dL 36(H) 23 19  Creatinine 0.61 - 1.24 mg/dL 1.15 0.97 0.95  Sodium 135 - 145 mmol/L 139 139 138  Potassium 3.5 - 5.1 mmol/L 3.9 4.3 4.3  Chloride 98 - 111 mmol/L 101 104 104  CO2 22 - 32 mmol/L 27 23 23   Calcium 8.9 - 10.3 mg/dL 8.5(L) 9.0 9.4  Total Protein 6.5 - 8.1 g/dL - 7.1 7.4  Total Bilirubin 0.3 - 1.2 mg/dL - 1.1 0.9  Alkaline Phos 38 - 126 U/L - 33(L) 35(L)  AST 15 - 41 U/L - 23 17  ALT 0 - 44 U/L - 42 38    Imaging studies: No new pertinent imaging studies   Assessment/Plan:  73 y.o. male with suspected anastomotic stricture, complicated by pertinent comorbidities including history of  Crohn's disease, hypertension. Patient with improvement of pain after being able to move his bowel.  I think that the main cause of the abdominal pain was that he was unable to pass gas or stool.  Now with a bowel prep he was able to move his bowels significantly and the pain is better.  I agree with GI that there is a low chance of this being Crohn's but it would be ideal to rule out before any surgery.  Also will be great if we can rule out malignancy.  Agree with colonoscopy today.  Hopefully with this will give enough information to be able to make further recommendations.  Today the white blood cell count became back to normal.  There is adequate hemoglobin.  There is adequate electrolytes.  We will continue to follow.  Arnold Long, MD  \

## 2019-01-11 NOTE — Transfer of Care (Signed)
Immediate Anesthesia Transfer of Care Note  Patient: Isaiah Walker  Procedure(s) Performed: COLONOSCOPY (N/A )  Patient Location: PACU  Anesthesia Type:General  Level of Consciousness: sedated  Airway & Oxygen Therapy: Patient Spontanous Breathing and Patient connected to nasal cannula oxygen  Post-op Assessment: Report given to RN and Post -op Vital signs reviewed and stable  Post vital signs: Reviewed and stable  Last Vitals:  Vitals Value Taken Time  BP 113/52 01/11/19 1300  Temp 36.3 C 01/11/19 1300  Pulse 63 01/11/19 1300  Resp 12 01/11/19 1300  SpO2 99 % 01/11/19 1300  Vitals shown include unvalidated device data.  Last Pain:  Vitals:   01/11/19 1259  TempSrc: Tympanic  PainSc:       Patients Stated Pain Goal: 0 (16/10/96 0454)  Complications: No apparent anesthesia complications

## 2019-01-11 NOTE — Op Note (Signed)
[facility] Gastroenterology Patient Name: Isaiah Walker Procedure Date: 01/11/2019 12:10 PM MRN: [id number] Account #: [id number] Date of Birth: [birth year] Admit Type: Outpatient Age: 73 Room: [facility] ENDO ROOM 1 Gender: Male Note Status: Finalized Procedure:            Colonoscopy of Post-surgical Anatomy Indications:          History of partial colectomy, Inflammatory bowel disease Providers:            Daenerys Buttram B. Bonna Gains MD, MD Referring MD:         Evern Bio Medicines:            Monitored Anesthesia Care Complications:        No immediate complications. Procedure:            After obtaining informed consent, the endoscope was                        passed under direct vision. Throughout the procedure,                        the patient's blood pressure, pulse, and oxygen                        saturations were monitored continuously. The                        Colonoscope was introduced through the anus and                        advanced to the the ileocolonic anastomosis. After                        obtaining informed consent, the endoscope was passed                        under direct vision. Throughout the procedure, the                        patient's blood pressure, pulse, and oxygen saturations                        were monitored continuously.The procedure was performed                        without difficulty. The patient tolerated the procedure                        well. The quality of the bowel preparation was fair. Findings:      The perianal and digital rectal examinations were normal.      Patient is status-post partial colectomy with a surgical anastomosis.      A benign-appearing, intrinsic severe stenosis was found at the       anastomosis and was non-traversed. Biopsies were taken with a cold       forceps for histology.      Two flat and sessile polyps were found in the rectum and descending       colon. The polyps were 4 to 6 mm  in size. These polyps were removed with       a cold snare. Resection and retrieval were complete.      Normal  mucosa was found in the entire colon. Biopsies were taken with a       cold forceps for histology.      The retroflexed view of the distal rectum and anal verge was normal and       showed no anal or rectal abnormalities. Impression:           - Preparation of the colon was fair.                       - Stricture at the colonic anastomosis. Biopsied.                       - It would be difficult to say that the stricture is                        from Crohns disease, given no other findings of Crohns                        besides a localized stricture at the anastomosis. This                        may be a post surgical stricture. Biopsies may help in                        this case.                       - Two 4 to 6 mm polyps in the rectum and in the                        descending colon, removed with a cold snare. Resected                        and retrieved.                       - Normal mucosa in the entire examined colon. Recommendation:       - Await pathology results.                       - Clear liquid diet today.                       - Continue present medications.                       - Follow up with surgery                       - The findings and recommendations were discussed with                        the patient.                       - The findings and recommendations were discussed with                        the patient's family. Procedure Code(s):    --- Professional ---  45385, Colonoscopy, flexible; with removal of tumor(s),                        polyp(s), or other lesion(s) by snare technique                       45380, 68, Colonoscopy, flexible; with biopsy, single                        or multiple Diagnosis Code(s):    --- Professional ---                       K56.699, Other intestinal obstruction unspecified as to                         partial versus complete obstruction                       K62.1, Rectal polyp                       K63.5, Polyp of colon                       Z90.49, Acquired absence of other specified parts of                        digestive tract                       K52.3, Indeterminate colitis CPT copyright 2019 American [employer] Association. All rights reserved. The codes documented in this report are preliminary and upon coder review may  be revised to meet current compliance requirements.  Vonda Antigua, MD Margretta Sidle B. Bonna Gains MD, MD 01/11/2019 1:56:44 PM This report has been signed electronically. Number of Addenda: 0 Note Initiated On: 01/11/2019 12:10 PM Scope Withdrawal Time: 0 hours 25 minutes 2 seconds  Total Procedure Duration: 0 hours 33 minutes 23 seconds  Estimated Blood Loss: Estimated blood loss: none.      [facility]

## 2019-01-11 NOTE — Anesthesia Postprocedure Evaluation (Signed)
Anesthesia Post Note  Patient: Isaiah Walker  Procedure(s) Performed: COLONOSCOPY (N/A )  Patient location during evaluation: Endoscopy Anesthesia Type: General Level of consciousness: awake and alert and oriented Pain management: pain level controlled Vital Signs Assessment: post-procedure vital signs reviewed and stable Respiratory status: spontaneous breathing, nonlabored ventilation and respiratory function stable Cardiovascular status: blood pressure returned to baseline and stable Postop Assessment: no signs of nausea or vomiting Anesthetic complications: no     Last Vitals:  Vitals:   01/11/19 1300 01/11/19 1412  BP: (!) 113/52 (!) 151/67  Pulse: 65 67  Resp: 13 17  Temp: (!) 36.3 C 36.5 C  SpO2: 97% 96%    Last Pain:  Vitals:   01/11/19 1412  TempSrc: Oral  PainSc:                  Ashyah Quizon

## 2019-01-11 NOTE — Anesthesia Post-op Follow-up Note (Signed)
Anesthesia QCDR form completed.        

## 2019-01-11 NOTE — Progress Notes (Addendum)
[facility] [place] NAME: Isaiah Walker    MR#:  [id number]  DATE OF BIRTH:  [birth year]  SUBJECTIVE:  CHIEF COMPLAINT:   Chief Complaint  Patient presents with  . Abdominal Pain   The patient has no abdominal pain he had diarrhea due to colonoscopy preparation. REVIEW OF SYSTEMS:  Review of Systems  Constitutional: Negative for chills, fever and malaise/fatigue.  HENT: Negative for sore throat.   Eyes: Negative for blurred vision and double vision.  Respiratory: Negative for cough, hemoptysis, shortness of breath, wheezing and stridor.   Cardiovascular: Negative for chest pain, palpitations, orthopnea and leg swelling.  Gastrointestinal: Negative for abdominal pain, blood in stool, constipation, diarrhea, melena, nausea and vomiting.  Genitourinary: Negative for dysuria, flank pain and hematuria.  Musculoskeletal: Negative for back pain and joint pain.  Skin: Negative for rash.  Neurological: Negative for dizziness, sensory change, focal weakness, seizures, loss of consciousness, weakness and headaches.  Endo/Heme/Allergies: Negative for polydipsia.  Psychiatric/Behavioral: Negative for depression. The patient is not nervous/anxious.     DRUG ALLERGIES:   Allergies  Allergen Reactions  . Ivp Dye [Iodinated Diagnostic Agents] Rash and Other (See Comments)    Pt has had CT scan since. Pt states "i'm fine"   VITALS:  Blood pressure (!) 151/67, pulse 67, temperature 97.7 F (36.5 C), temperature source Oral, resp. rate 17, height 5\' 6"  (1.676 m), weight 122.5 kg, SpO2 96 %. PHYSICAL EXAMINATION:  Physical Exam Constitutional:      General: He is not in acute distress.    Appearance: He is obese.  HENT:     Head: Normocephalic.     Mouth/Throat:     Mouth: Mucous membranes are moist.  Eyes:     General: No scleral icterus.    Conjunctiva/sclera: Conjunctivae normal.     Pupils: Pupils are equal, round, and reactive to light.   Neck:     Musculoskeletal: Normal range of motion and neck supple.     Vascular: No JVD.     Trachea: No tracheal deviation.  Cardiovascular:     Rate and Rhythm: Normal rate and regular rhythm.     Heart sounds: Normal heart sounds. No murmur. No gallop.   Pulmonary:     Effort: Pulmonary effort is normal. No respiratory distress.     Breath sounds: Normal breath sounds. No wheezing or rales.  Abdominal:     General: Bowel sounds are normal. There is distension.     Palpations: Abdomen is soft.     Tenderness: There is abdominal tenderness. There is no rebound.  Musculoskeletal: Normal range of motion.        General: No tenderness.     Right lower leg: No edema.     Left lower leg: No edema.  Skin:    Findings: No erythema or rash.  Neurological:     Mental Status: He is alert and oriented to person, place, and time.     Cranial Nerves: No cranial nerve deficit.  Psychiatric:        Mood and Affect: Mood normal.    LABORATORY PANEL:  Male CBC Recent Labs  Lab 01/11/19 0544  WBC 8.6  HGB 15.3  HCT 46.8  PLT 215   ------------------------------------------------------------------------------------------------------------------ Chemistries  Recent Labs  Lab 01/10/19 0358 01/11/19 0544  NA 139 139  K 4.3 3.9  CL 104 101  CO2 23 27  GLUCOSE 168* 143*  BUN 23 36*  CREATININE 0.97  1.15  CALCIUM 9.0 8.5*  MG  --  2.0  AST 23  --   ALT 42  --   ALKPHOS 33*  --   BILITOT 1.1  --    RADIOLOGY:  No results found. ASSESSMENT AND PLAN:   #Acute lower abdominal pain with partial obstruction probably from ileocolonic anastomosis stricture Clear liquid diet. IV pain management as needed, IV antiemetics and supportive treatment Dr. Peyton Najjar recommends GI consult and colonoscopy prior to further surgical interventions. Colonoscopy: Stricture at the colonic anastomosis; Two 4 to 6 mm polyps in the rectum and in the descending colon, removed with a cold snare per Dr.  Bonna Gains.  #History of Crohn's disease status post terminal ileocecal resection Not seen by gastroenterology for Crohn's Colonoscopy as above.  #Essential hypertension Continue home medication metoprolol and Norvasc and hold Cozaar  #Benign prostatic hypertrophy Continue Proscar  Dehydration.  IV fluid support and follow-up BMP. Leukocytosis.  Possible due to reaction secondary to above.  Improved. I called His wife but nobody answered the phone. All the records are reviewed and case discussed with Care Management/Social Worker. Management plans discussed with the patient, family and they are in agreement.  CODE STATUS: Full Code  TOTAL TIME TAKING CARE OF THIS PATIENT: 28 minutes.   More than 50% of the time was spent in counseling/coordination of care: YES  POSSIBLE D/C IN 2 DAYS, DEPENDING ON CLINICAL CONDITION.   Demetrios Loll M.D on 01/11/2019 at 3:00 PM  Between 7am to 6pm - Pager - [phone]  After 6pm go to [email or web address] - [facility]

## 2019-01-12 ENCOUNTER — Encounter: Payer: Self-pay | Admitting: Gastroenterology

## 2019-01-12 DIAGNOSIS — K50912 Crohn's disease, unspecified, with intestinal obstruction: Secondary | ICD-10-CM

## 2019-01-12 LAB — BASIC METABOLIC PANEL
Anion gap: 6 (ref 5–15)
BUN: 29 mg/dL — ABNORMAL HIGH (ref 8–23)
CO2: 24 mmol/L (ref 22–32)
Calcium: 8.2 mg/dL — ABNORMAL LOW (ref 8.9–10.3)
Chloride: 110 mmol/L (ref 98–111)
Creatinine, Ser: 0.85 mg/dL (ref 0.61–1.24)
GFR calc Af Amer: >60 mL/min (ref >60)
GFR calc non Af Amer: >60 mL/min (ref >60)
Glucose, Bld: 109 mg/dL — ABNORMAL HIGH (ref 70–99)
Potassium: 3.9 mmol/L (ref 3.5–5.1)
Sodium: 140 mmol/L (ref 135–145)

## 2019-01-12 LAB — SURGICAL PATHOLOGY

## 2019-01-12 MED ORDER — METHYLPREDNISOLONE SODIUM SUCC 40 MG IJ SOLR
40.0000 mg | Freq: Every day | INTRAMUSCULAR | Status: DC
  Administered 2019-01-12 – 2019-01-13 (×2): 40 mg via INTRAVENOUS
  Filled 2019-01-12 (×2): qty 1

## 2019-01-12 NOTE — Progress Notes (Signed)
[facility] NAME: Isaiah Walker    MR#:  [id number]  DATE OF BIRTH:  June 09, 1946  SUBJECTIVE:  CHIEF COMPLAINT:   Chief Complaint  Patient presents with  . Abdominal Pain   The patient ahs some abdominal pain with eating.  He had multiple times of diarrhea last night. REVIEW OF SYSTEMS:  Review of Systems  Constitutional: Negative for chills, fever and malaise/fatigue.  HENT: Negative for sore throat.   Eyes: Negative for blurred vision and double vision.  Respiratory: Negative for cough, hemoptysis, shortness of breath, wheezing and stridor.   Cardiovascular: Negative for chest pain, palpitations, orthopnea and leg swelling.  Gastrointestinal: Positive for diarrhea. Negative for abdominal pain, blood in stool, constipation, melena, nausea and vomiting.  Genitourinary: Negative for dysuria, flank pain and hematuria.  Musculoskeletal: Negative for back pain and joint pain.  Skin: Negative for rash.  Neurological: Negative for dizziness, sensory change, focal weakness, seizures, loss of consciousness, weakness and headaches.  Endo/Heme/Allergies: Negative for polydipsia.  Psychiatric/Behavioral: Negative for depression. The patient is not nervous/anxious.    DRUG ALLERGIES:   Allergies  Allergen Reactions  . Ivp Dye [Iodinated Diagnostic Agents] Rash and Other (See Comments)    Pt has had CT scan since. Pt states "i'm fine"   VITALS:  Blood pressure 124/65, pulse 68, temperature 98.4 F (36.9 C), temperature source Oral, resp. rate 20, height 5\' 6"  (1.676 m), weight 122.5 kg, SpO2 97 %. PHYSICAL EXAMINATION:  Physical Exam Constitutional:      General: He is not in acute distress.    Appearance: He is obese.  HENT:     Head: Normocephalic.     Mouth/Throat:     Mouth: Mucous membranes are moist.  Eyes:     General: No scleral icterus.    Conjunctiva/sclera: Conjunctivae normal.     Pupils: Pupils are equal, round, and  reactive to light.  Neck:     Musculoskeletal: Normal range of motion and neck supple.     Vascular: No JVD.     Trachea: No tracheal deviation.  Cardiovascular:     Rate and Rhythm: Normal rate and regular rhythm.     Heart sounds: Normal heart sounds. No murmur. No gallop.   Pulmonary:     Effort: Pulmonary effort is normal. No respiratory distress.     Breath sounds: Normal breath sounds. No wheezing or rales.  Abdominal:     General: Bowel sounds are normal. There is distension.     Palpations: Abdomen is soft.     Tenderness: There is no abdominal tenderness. There is no rebound.  Musculoskeletal: Normal range of motion.        General: No tenderness.     Right lower leg: No edema.     Left lower leg: No edema.  Skin:    Findings: No erythema or rash.  Neurological:     Mental Status: He is alert and oriented to person, place, and time.     Cranial Nerves: No cranial nerve deficit.  Psychiatric:        Mood and Affect: Mood normal.    LABORATORY PANEL:  Male CBC Recent Labs  Lab 01/11/19 0544  WBC 8.6  HGB 15.3  HCT 46.8  PLT 215   ------------------------------------------------------------------------------------------------------------------ Chemistries  Recent Labs  Lab 01/10/19 0358 01/11/19 0544 01/12/19 0636  NA 139 139 140  K 4.3 3.9 3.9  CL 104 101 110  CO2 23 27 24  GLUCOSE 168* 143* 109*  BUN 23 36* 29*  CREATININE 0.97 1.15 0.85  CALCIUM 9.0 8.5* 8.2*  MG  --  2.0  --   AST 23  --   --   ALT 42  --   --   ALKPHOS 33*  --   --   BILITOT 1.1  --   --    RADIOLOGY:  No results found. ASSESSMENT AND PLAN:   #Acute lower abdominal pain with partial obstruction probably from ileocolonic anastomosis stricture Clear liquid diet. IV pain management as needed, IV antiemetics and supportive treatment Dr. Peyton Najjar recommends GI consult and colonoscopy prior to further surgical interventions. Colonoscopy: Stricture at the colonic anastomosis; Two  4 to 6 mm polyps in the rectum and in the descending colon, removed with a cold snare per Dr. Bonna Gains. Per Dr. Zachery Dauer, Colonoscopy confirmed a stricture.  Likely chronic stricture from the anastomosis, wait for pathology results to discuss surgical management.    #History of Crohn's disease status post terminal ileocecal resection Not seen by gastroenterology for Crohn's Colonoscopy as above.  #Essential hypertension Continue home medication metoprolol and Norvasc and hold Cozaar  #Benign prostatic hypertrophy Continue Proscar  Dehydration.  IV fluid support and follow-up BMP. Leukocytosis.  Possible due to reaction secondary to above.  Improved. I called His wife but still nobody answered the phone. All the records are reviewed and case discussed with Care Management/Social Worker. Management plans discussed with the patient, family and they are in agreement.  CODE STATUS: Full Code  TOTAL TIME TAKING CARE OF THIS PATIENT: 27 minutes.   More than 50% of the time was spent in counseling/coordination of care: YES  POSSIBLE D/C IN 2 DAYS, DEPENDING ON CLINICAL CONDITION.   Demetrios Loll M.D on 01/12/2019 at 12:24 PM  Between 7am to 6pm - Pager - [phone]  After 6pm go to [email or web address] - [facility]

## 2019-01-12 NOTE — Progress Notes (Signed)
[facility] Hospital Day(s): 3.   Post op day(s): 1 Day Post-Op.   Interval History: Patient seen and examined, no acute events or new complaints overnight. Patient reports having diarrhea.  Denies nausea or vomiting.  Denies fever or chills.  Reports abdominal pain is better.  Vital signs in last 24 hours: [min-max] current  Temp:  [97 F (36.1 C)-98.4 F (36.9 C)] 98.4 F (36.9 C) (07/29 0420) Pulse Rate:  [65-81] 68 (07/29 0420) Resp:  [13-20] 20 (07/29 0420) BP: (113-151)/(52-71) 124/65 (07/29 0420) SpO2:  [94 %-97 %] 97 % (07/29 0420)     Height: 5\' 6"  (167.6 cm) Weight: 122.5 kg BMI (Calculated): 43.61   Physical Exam:  Constitutional: alert, cooperative and no distress  Respiratory: breathing non-labored at rest  Cardiovascular: regular rate and sinus rhythm  Gastrointestinal: soft, non-tender, and non-distended.  Labs:  CBC Latest Ref Rng & Units 01/11/2019 01/10/2019 01/09/2019  WBC 4.0 - 10.5 K/uL 8.6 17.2(H) 16.2(H)  Hemoglobin 13.0 - 17.0 g/dL 15.3 15.7 15.9  Hematocrit 39.0 - 52.0 % 46.8 47.3 46.5  Platelets 150 - 400 K/uL 215 212 211   CMP Latest Ref Rng & Units 01/12/2019 01/11/2019 01/10/2019  Glucose 70 - 99 mg/dL 109(H) 143(H) 168(H)  BUN 8 - 23 mg/dL 29(H) 36(H) 23  Creatinine 0.61 - 1.24 mg/dL 0.85 1.15 0.97  Sodium 135 - 145 mmol/L 140 139 139  Potassium 3.5 - 5.1 mmol/L 3.9 3.9 4.3  Chloride 98 - 111 mmol/L 110 101 104  CO2 22 - 32 mmol/L 24 27 23   Calcium 8.9 - 10.3 mg/dL 8.2(L) 8.5(L) 9.0  Total Protein 6.5 - 8.1 g/dL - - 7.1  Total Bilirubin 0.3 - 1.2 mg/dL - - 1.1  Alkaline Phos 38 - 126 U/L - - 33(L)  AST 15 - 41 U/L - - 23  ALT 0 - 44 U/L - - 42    Imaging studies: No new pertinent imaging studies   Assessment/Plan:  73 y.o.malewith suspected anastomotic stricture, complicated by pertinent comorbidities includinghistory ofCrohn's disease, hypertension. Patient  had colonoscopy yesterday.  Colonoscopy confirmed a stricture.   Likely chronic stricture from the anastomosis.  Will wait for pathology results to discuss surgical management.  Patient reports he is having diarrhea most likely from bowel prep the day before.  He does states having some pain with eating.  If this is a chronic stricture from the anastomosis line as it is suspected patient might need partial colectomy with a new anastomosis.  I discussed this with the patient and I told him that I was going to discuss with pathology to have the results soon as possible.  Agreed to continue clear liquids until surgery is decided.  Arnold Long, MD

## 2019-01-12 NOTE — Progress Notes (Signed)
Isaiah Antigua, MD [address], [place], [place], [place], [address] [address], [place], [place], [place], [address] Phone: [phone]  Fax: [phone]   Subjective: Patient tolerating clear liquid diet without nausea or vomiting.  Reports 2-3 loose bowel movements today.  No blood.  Does report abdominal pain but it is much improved since presentation   Objective: Exam: Vital signs in last 24 hours: Vitals:   01/11/19 1412 01/11/19 2125 01/12/19 0420 01/12/19 1245  BP: (!) 151/67 138/62 124/65 (!) 146/63  Pulse: 67 81 68 66  Resp: 17 16 20 20   Temp: 97.7 F (36.5 C) (!) 97.5 F (36.4 C) 98.4 F (36.9 C) (!) 97.5 F (36.4 C)  TempSrc: Oral Oral Oral Oral  SpO2: 96% 94% 97% 97%  Weight:      Height:       Weight change:   Intake/Output Summary (Last 24 hours) at 01/12/2019 1520 Last data filed at 01/12/2019 0600 Gross per 24 hour  Intake 935 ml  Output -  Net 935 ml    General: No acute distress, AAO x3 Abd: Soft, NT/ND, No HSM Skin: Warm, no rashes Neck: Supple, Trachea midline   Lab Results: Lab Results  Component Value Date   WBC 8.6 01/11/2019   HGB 15.3 01/11/2019   HCT 46.8 01/11/2019   MCV 90.7 01/11/2019   PLT 215 01/11/2019   Micro Results: Recent Results (from the past 240 hour(s))  SARS Coronavirus 2 (CEPHEID - Performed in [facility] hospital lab), Hosp Order     Status: None   Collection Time: 01/09/19  6:31 PM   Specimen: Nasopharyngeal Swab  Result Value Ref Range Status   SARS Coronavirus 2 NEGATIVE NEGATIVE Final    Comment: (NOTE) If result is NEGATIVE SARS-CoV-2 target nucleic acids are NOT DETECTED. The SARS-CoV-2 RNA is generally detectable in upper and lower  respiratory specimens during the acute phase of infection. The lowest  concentration of SARS-CoV-2 viral copies this assay can detect is 250  copies / mL. A negative result does not preclude SARS-CoV-2 infection  and should not be used as the sole basis for  treatment or other  patient management decisions.  A negative result may occur with  improper specimen collection / handling, submission of specimen other  than nasopharyngeal swab, presence of viral mutation(s) within the  areas targeted by this assay, and inadequate number of viral copies  (<250 copies / mL). A negative result must be combined with clinical  observations, patient history, and epidemiological information. If result is POSITIVE SARS-CoV-2 target nucleic acids are DETECTED. The SARS-CoV-2 RNA is generally detectable in upper and lower  respiratory specimens dur ing the acute phase of infection.  Positive  results are indicative of active infection with SARS-CoV-2.  Clinical  correlation with patient history and other diagnostic information is  necessary to determine patient infection status.  Positive results do  not rule out bacterial infection or co-infection with other viruses. If result is PRESUMPTIVE POSTIVE SARS-CoV-2 nucleic acids MAY BE PRESENT.   A presumptive positive result was obtained on the submitted specimen  and confirmed on repeat testing.  While 2019 novel coronavirus  (SARS-CoV-2) nucleic acids may be present in the submitted sample  additional confirmatory testing may be necessary for epidemiological  and / or clinical management purposes  to differentiate between  SARS-CoV-2 and other Sarbecovirus currently known to infect humans.  If clinically indicated additional testing with an alternate test  methodology [phone]) is advised. The  SARS-CoV-2 RNA is generally  detectable in upper and lower respiratory sp ecimens during the acute  phase of infection. The expected result is Negative. Fact Sheet for Patients:  StrictlyIdeas.no Fact Sheet for Healthcare Providers: BankingDealers.co.za This test is not yet approved or cleared by the [place] FDA and has been authorized for detection and/or  diagnosis of SARS-CoV-2 by FDA under an Emergency Use Authorization (EUA).  This EUA will remain in effect (meaning this test can be used) for the duration of the COVID-19 declaration under Section 564(b)(1) of the Act, 21 U.S.C. section 360bbb-3(b)(1), unless the authorization is terminated or revoked sooner. Performed at [facility], [address] [place], [place] [address]    Studies/Results: No results found. Medications:  Scheduled Meds: . amLODipine  2.5 mg Oral Daily  . finasteride  5 mg Oral Daily  . methylPREDNISolone (SOLU-MEDROL) injection  40 mg Intravenous Daily  . metoprolol tartrate  50 mg Oral BID  . pantoprazole (PROTONIX) IV  40 mg Intravenous Q24H  . sodium chloride flush  3 mL Intravenous Once  . traZODone  150 mg Oral QHS   Continuous Infusions: . sodium chloride 50 mL/hr at 01/12/19 1308   PRN Meds:.hydrALAZINE, metoprolol tartrate, morphine injection, ondansetron **OR** ondansetron (ZOFRAN) IV, prochlorperazine, traMADol   Assessment: Active Problems:   Acute abdominal pain    Plan: Pathology results reviewed and shows chronic changes with architectural distortion consistent with history of Crohn's disease.  I discussed the case with Dr. Marius Ditch, the IBD specialist in the clinic, and reviewed if the stricture site would be amenable to medical therapy or if surgical intervention would be the best next step.  She has reviewed the patient's chart and reviewed the stricture site of the available on the colonoscopy images.  She feels the stricture can be amenable to dilation via colonoscopy.  Would recommend MRE or CTE for evaluation of inflammation prior to a colonoscopy.  We discussed starting patient on steroid therapy in the meantime.  If patient's abdominal pain continues to improve, further imaging and colonoscopy with Dr. Berneice Heinrich can be set up as an outpatient.  I started patient on Solu-Medrol 40 mg IV daily   Please change this to  prednisone 40 mg oral daily at the time of discharge and give patient at least a 2-week supply.  Follow-up in GI clinic within 1 to 2 weeks of discharge . CTE or MRE to be set up as an outpatient on discharge  I have discussed this with Dr. Windell Moment of surgery and Dr. Bridgett Larsson via epic messaging.  Plan above discussed extensively with patient and he is agreeable.  Abdominal pain reoccurs after hospital discharge she is to call us and he verbalized understanding  Low residue, full liquid diet as an outpatient     LOS: 3 days   Isaiah Antigua, MD 01/12/2019, 3:20 PM

## 2019-01-12 NOTE — Care Management Important Message (Signed)
Important Message  Patient Details  Name: Isaiah Walker MRN: [id number] Date of Birth: 03-Nov-1945   Medicare Important Message Given:  Yes     Dannette Barbara 01/12/2019, 11:37 AM

## 2019-01-12 NOTE — Progress Notes (Signed)
Called Dr. Marcille Blanco regarding patient complaints of diarrhea (8 liquid stools total) and wanting something to relieve it. Doctor explained that anti-diarrheals can stop motility of bowels and exacerbate the problem.  Will continue to monitor. Christene Slates  01/12/2019 5:21 AM

## 2019-01-13 NOTE — Progress Notes (Signed)
[facility] Hospital Day(s): 4.   Post op day(s): 2 Days Post-Op.   Interval History: Patient seen and examined, no acute events or new complaints overnight. Patient denies nausea or vomiting. Continue passing gas.   Vital signs in last 24 hours: [min-max] current  Temp:  [97.5 F (36.4 C)-98.4 F (36.9 C)] 98.3 F (36.8 C) (07/30 0523) Pulse Rate:  [63-77] 63 (07/30 0523) Resp:  [18-20] 18 (07/30 0523) BP: (146-156)/(63-73) 147/71 (07/30 0523) SpO2:  [95 %-98 %] 98 % (07/30 0523)     Height: 5\' 6"  (167.6 cm) Weight: 122.5 kg BMI (Calculated): 43.61   Physical Exam:  Constitutional: alert, cooperative and no distress  Respiratory: breathing non-labored at rest  Cardiovascular: regular rate and sinus rhythm  Gastrointestinal: soft, non-tender, and non-distended  Labs:  CBC Latest Ref Rng & Units 01/11/2019 01/10/2019 01/09/2019  WBC 4.0 - 10.5 K/uL 8.6 17.2(H) 16.2(H)  Hemoglobin 13.0 - 17.0 g/dL 15.3 15.7 15.9  Hematocrit 39.0 - 52.0 % 46.8 47.3 46.5  Platelets 150 - 400 K/uL 215 212 211   CMP Latest Ref Rng & Units 01/12/2019 01/11/2019 01/10/2019  Glucose 70 - 99 mg/dL 109(H) 143(H) 168(H)  BUN 8 - 23 mg/dL 29(H) 36(H) 23  Creatinine 0.61 - 1.24 mg/dL 0.85 1.15 0.97  Sodium 135 - 145 mmol/L 140 139 139  Potassium 3.5 - 5.1 mmol/L 3.9 3.9 4.3  Chloride 98 - 111 mmol/L 110 101 104  CO2 22 - 32 mmol/L 24 27 23   Calcium 8.9 - 10.3 mg/dL 8.2(L) 8.5(L) 9.0  Total Protein 6.5 - 8.1 g/dL - - 7.1  Total Bilirubin 0.3 - 1.2 mg/dL - - 1.1  Alkaline Phos 38 - 126 U/L - - 33(L)  AST 15 - 41 U/L - - 23  ALT 0 - 44 U/L - - 42    Imaging studies: No new pertinent imaging studies   Assessment/Plan:  73 y.o.malewith suspected anastomotic stricture, complicated by pertinent comorbidities includinghistory ofCrohn's disease, hypertension. Patient found with colonoscopy biopsy consistent with Crohn's disease. GI planning on medical treatment and possible anastomosis  dilation to try to avoid further intestinal resection. Also will continue with workup as MRE. I agree with this plan. No sign of obstruction. No need of urgent surgical management at this moment. Will remain aware in case of I can be of assistance.   Arnold Long, MD

## 2019-01-13 NOTE — Progress Notes (Signed)
Vonda Antigua, MD [address] [place], [place], [place], [address] [address], [place], [place], [place], [address] Phone: [phone]  Fax: [phone]   Subjective:  Patient tolerating steroid supplementation well.  No nausea or vomiting.  Tolerating clear liquid diet.  States had abdominal pain this morning but it is much better this evening.  Objective: Exam: Vital signs in last 24 hours: Vitals:   01/12/19 2048 01/13/19 0523 01/13/19 0900 01/13/19 1309  BP: (!) 156/73 (!) 147/71 138/69 (!) 152/73  Pulse: 77 63 66 66  Resp: 18 18    Temp: 98.4 F (36.9 C) 98.3 F (36.8 C)  98 F (36.7 C)  TempSrc: Oral Oral  Oral  SpO2: 95% 98%  94%  Weight:      Height:       Weight change:   Intake/Output Summary (Last 24 hours) at 01/13/2019 1710 Last data filed at 01/13/2019 0908 Gross per 24 hour  Intake 635.23 ml  Output -  Net 635.23 ml    General: No acute distress, AAO x3 Abd: Soft, NT/ND, No HSM Skin: Warm, no rashes Neck: Supple, Trachea midline   Lab Results: Lab Results  Component Value Date   WBC 8.6 01/11/2019   HGB 15.3 01/11/2019   HCT 46.8 01/11/2019   MCV 90.7 01/11/2019   PLT 215 01/11/2019   Micro Results: Recent Results (from the past 240 hour(s))  SARS Coronavirus 2 (CEPHEID - Performed in [facility] hospital lab), Hosp Order     Status: None   Collection Time: 01/09/19  6:31 PM   Specimen: Nasopharyngeal Swab  Result Value Ref Range Status   SARS Coronavirus 2 NEGATIVE NEGATIVE Final    Comment: (NOTE) If result is NEGATIVE SARS-CoV-2 target nucleic acids are NOT DETECTED. The SARS-CoV-2 RNA is generally detectable in upper and lower  respiratory specimens during the acute phase of infection. The lowest  concentration of SARS-CoV-2 viral copies this assay can detect is 250  copies / mL. A negative result does not preclude SARS-CoV-2 infection  and should not be used as the sole basis for treatment or other  patient  management decisions.  A negative result may occur with  improper specimen collection / handling, submission of specimen other  than nasopharyngeal swab, presence of viral mutation(s) within the  areas targeted by this assay, and inadequate number of viral copies  (<250 copies / mL). A negative result must be combined with clinical  observations, patient history, and epidemiological information. If result is POSITIVE SARS-CoV-2 target nucleic acids are DETECTED. The SARS-CoV-2 RNA is generally detectable in upper and lower  respiratory specimens dur ing the acute phase of infection.  Positive  results are indicative of active infection with SARS-CoV-2.  Clinical  correlation with patient history and other diagnostic information is  necessary to determine patient infection status.  Positive results do  not rule out bacterial infection or co-infection with other viruses. If result is PRESUMPTIVE POSTIVE SARS-CoV-2 nucleic acids MAY BE PRESENT.   A presumptive positive result was obtained on the submitted specimen  and confirmed on repeat testing.  While 2019 novel coronavirus  (SARS-CoV-2) nucleic acids may be present in the submitted sample  additional confirmatory testing may be necessary for epidemiological  and / or clinical management purposes  to differentiate between  SARS-CoV-2 and other Sarbecovirus currently known to infect humans.  If clinically indicated additional testing with an alternate test  methodology [phone]) is advised. The SARS-CoV-2 RNA is generally  detectable  in upper and lower respiratory sp ecimens during the acute  phase of infection. The expected result is Negative. Fact Sheet for Patients:  StrictlyIdeas.no Fact Sheet for Healthcare Providers: BankingDealers.co.za This test is not yet approved or cleared by the [place] FDA and has been authorized for detection and/or diagnosis of SARS-CoV-2 by FDA under  an Emergency Use Authorization (EUA).  This EUA will remain in effect (meaning this test can be used) for the duration of the COVID-19 declaration under Section 564(b)(1) of the Act, 21 U.S.C. section 360bbb-3(b)(1), unless the authorization is terminated or revoked sooner. Performed at [facility], [address], [place], [place] [address]    Studies/Results: No results found. Medications:  Scheduled Meds: . amLODipine  2.5 mg Oral Daily  . finasteride  5 mg Oral Daily  . methylPREDNISolone (SOLU-MEDROL) injection  40 mg Intravenous Daily  . metoprolol tartrate  50 mg Oral BID  . pantoprazole (PROTONIX) IV  40 mg Intravenous Q24H  . sodium chloride flush  3 mL Intravenous Once  . traZODone  150 mg Oral QHS   Continuous Infusions: PRN Meds:.hydrALAZINE, metoprolol tartrate, morphine injection, ondansetron **OR** ondansetron (ZOFRAN) IV, prochlorperazine, traMADol   Assessment: Active Problems:   Acute abdominal pain    Plan: Abdominal pain continues to improve Continue steroids Prednisone 40 mg daily as an outpatient  Follow-up in GI clinic within 2 weeks of discharge  However, if abdominal pain worsens when he goes home he was asked to return to the ED or call us and he verbalized understanding  He was asked to maintain a soft diet at home, low residue, low fiber diet   LOS: 4 days   Vonda Antigua, MD 01/13/2019, 5:10 PM

## 2019-01-13 NOTE — Discharge Instructions (Signed)
Low residue, full liquid diet as an outpatient

## 2019-01-13 NOTE — Progress Notes (Signed)
[place] [facility] [place] NAME: Isaiah Walker    MR#:  [id number]  DATE OF BIRTH:  01-09-1946  SUBJECTIVE:  CHIEF COMPLAINT:   Chief Complaint  Patient presents with  . Abdominal Pain   The patient still has some abdominal pain with eating.  He is on clear liquid diet.  Diarrhea. REVIEW OF SYSTEMS:  Review of Systems  Constitutional: Negative for chills, fever and malaise/fatigue.  HENT: Negative for sore throat.   Eyes: Negative for blurred vision and double vision.  Respiratory: Negative for cough, hemoptysis, shortness of breath, wheezing and stridor.   Cardiovascular: Negative for chest pain, palpitations, orthopnea and leg swelling.  Gastrointestinal: Positive for abdominal pain and diarrhea. Negative for blood in stool, constipation, melena, nausea and vomiting.  Genitourinary: Negative for dysuria, flank pain and hematuria.  Musculoskeletal: Negative for back pain and joint pain.  Skin: Negative for rash.  Neurological: Negative for dizziness, sensory change, focal weakness, seizures, loss of consciousness, weakness and headaches.  Endo/Heme/Allergies: Negative for polydipsia.  Psychiatric/Behavioral: Negative for depression. The patient is not nervous/anxious.    DRUG ALLERGIES:   Allergies  Allergen Reactions  . Ivp Dye [Iodinated Diagnostic Agents] Rash and Other (See Comments)    Pt has had CT scan since. Pt states "i'm fine"   VITALS:  Blood pressure 138/69, pulse 66, temperature 98.3 F (36.8 C), temperature source Oral, resp. rate 18, height 5\' 6"  (1.676 m), weight 122.5 kg, SpO2 98 %. PHYSICAL EXAMINATION:  Physical Exam Constitutional:      General: He is not in acute distress.    Appearance: He is obese.  HENT:     Head: Normocephalic.     Mouth/Throat:     Mouth: Mucous membranes are moist.  Eyes:     General: No scleral icterus.    Conjunctiva/sclera: Conjunctivae normal.     Pupils: Pupils are equal, round,  and reactive to light.  Neck:     Musculoskeletal: Normal range of motion and neck supple.     Vascular: No JVD.     Trachea: No tracheal deviation.  Cardiovascular:     Rate and Rhythm: Normal rate and regular rhythm.     Heart sounds: Normal heart sounds. No murmur. No gallop.   Pulmonary:     Effort: Pulmonary effort is normal. No respiratory distress.     Breath sounds: Normal breath sounds. No wheezing or rales.  Abdominal:     General: Bowel sounds are normal. There is distension.     Palpations: Abdomen is soft.     Tenderness: There is no abdominal tenderness. There is no rebound.  Musculoskeletal: Normal range of motion.        General: No tenderness.     Right lower leg: No edema.     Left lower leg: No edema.  Skin:    Findings: No erythema or rash.  Neurological:     Mental Status: He is alert and oriented to person, place, and time.     Cranial Nerves: No cranial nerve deficit.  Psychiatric:        Mood and Affect: Mood normal.    LABORATORY PANEL:  Male CBC Recent Labs  Lab 01/11/19 0544  WBC 8.6  HGB 15.3  HCT 46.8  PLT 215   ------------------------------------------------------------------------------------------------------------------ Chemistries  Recent Labs  Lab 01/10/19 0358 01/11/19 0544 01/12/19 0636  NA 139 139 140  K 4.3 3.9 3.9  CL 104 101 110  CO2 23  27 24  GLUCOSE 168* 143* 109*  BUN 23 36* 29*  CREATININE 0.97 1.15 0.85  CALCIUM 9.0 8.5* 8.2*  MG  --  2.0  --   AST 23  --   --   ALT 42  --   --   ALKPHOS 33*  --   --   BILITOT 1.1  --   --    RADIOLOGY:  No results found. ASSESSMENT AND PLAN:   #Acute lower abdominal pain with partial obstruction probably from ileocolonic anastomosis stricture Clear liquid diet. IV pain management as needed, IV antiemetics and supportive treatment Dr. Peyton Najjar recommends GI consult and colonoscopy prior to further surgical interventions.   Colonoscopy: Stricture at the colonic  anastomosis; Two 4 to 6 mm polyps in the rectum and in the descending colon, removed with a cold snare per Dr. Bonna Gains. Per Dr. Bonna Gains, started patient on Solu-Medrol 40 mg IV daily change to oral prednisone 40 mg p.o. daily at discharge and continue at least 2 weeks. Follow-up in GI clinic within 1 to 2 weeks of discharge. Per Dr. Zachery Dauer, No need of urgent surgical management at this moment.    #History of Crohn's disease status post terminal ileocecal resection Not seen by gastroenterology for Crohn's Colonoscopy as above.  #Essential hypertension Continue home medication metoprolol and Norvasc and hold Cozaar  #Benign prostatic hypertrophy Continue Proscar  Dehydration.  Improving with IV fluid support. Leukocytosis.  Possible due to reaction secondary to above.  Improved. I called His wife but still nobody answered the phone. All the records are reviewed and case discussed with Care Management/Social Worker. Management plans discussed with the patient, family and they are in agreement.  CODE STATUS: Full Code  TOTAL TIME TAKING CARE OF THIS PATIENT: 25 minutes.   More than 50% of the time was spent in counseling/coordination of care: YES  POSSIBLE D/C IN 1-2 DAYS, DEPENDING ON CLINICAL CONDITION.   Demetrios Loll M.D on 01/13/2019 at 11:30 AM  Between 7am to 6pm - Pager - [phone]  After 6pm go to [email or web address] - [facility]

## 2019-01-14 LAB — BASIC METABOLIC PANEL
Anion gap: 6 (ref 5–15)
BUN: 24 mg/dL — ABNORMAL HIGH (ref 8–23)
CO2: 25 mmol/L (ref 22–32)
Calcium: 8.2 mg/dL — ABNORMAL LOW (ref 8.9–10.3)
Chloride: 107 mmol/L (ref 98–111)
Creatinine, Ser: 0.96 mg/dL (ref 0.61–1.24)
GFR calc Af Amer: >60 mL/min (ref >60)
GFR calc non Af Amer: >60 mL/min (ref >60)
Glucose, Bld: 111 mg/dL — ABNORMAL HIGH (ref 70–99)
Potassium: 3.7 mmol/L (ref 3.5–5.1)
Sodium: 138 mmol/L (ref 135–145)

## 2019-01-14 LAB — HEPATITIS B SURFACE ANTIBODY, QUANTITATIVE: Hep B S AB Quant (Post): <3.1 m[IU]/mL — ABNORMAL LOW (ref >9.9)

## 2019-01-14 LAB — HEPATITIS B CORE ANTIBODY, TOTAL: Hep B Core Total Ab: NEGATIVE

## 2019-01-14 LAB — HEPATITIS B SURFACE ANTIGEN: Hepatitis B Surface Ag: NEGATIVE

## 2019-01-14 LAB — MAGNESIUM: Magnesium: 2.1 mg/dL (ref 1.7–2.4)

## 2019-01-14 SURGERY — LAPAROSCOPIC PARTIAL RIGHT COLECTOMY
Anesthesia: General | Laterality: Right

## 2019-01-14 MED ORDER — PREDNISONE 20 MG PO TABS
40.0000 mg | ORAL_TABLET | Freq: Every day | ORAL | Status: DC
  Administered 2019-01-14: 40 mg via ORAL
  Filled 2019-01-14: qty 2

## 2019-01-14 MED ORDER — PREDNISONE 20 MG PO TABS: 40.0000 mg | ORAL_TABLET | Freq: Every day | ORAL | 0 refills | Status: AC

## 2019-01-14 MED ORDER — TRAMADOL HCL 50 MG PO TABS: 50.0000 mg | ORAL_TABLET | Freq: Four times a day (QID) | ORAL | 0 refills | Status: AC | PRN

## 2019-01-14 NOTE — Progress Notes (Signed)
Angelgabriel Nat Christen to be D/C'd home per MD order.  Discussed prescriptions and follow up appointments with the patient. Prescriptions given to patient, medication list explained in detail. Pt verbalized understanding.  Allergies as of 01/14/2019      Reactions   Ivp Dye [iodinated Diagnostic Agents] Rash, Other (See Comments)   Pt has had CT scan since. Pt states "i'm fine"      Medication List    TAKE these medications   allopurinol 300 MG tablet Commonly known as: ZYLOPRIM Take 300 mg by mouth daily.   amLODipine 2.5 MG tablet Commonly known as: NORVASC Take 2.5 mg by mouth daily.   finasteride 5 MG tablet Commonly known as: PROSCAR Take 1 tablet (5 mg total) by mouth daily.   losartan 100 MG tablet Commonly known as: COZAAR TAKE 1 TABLET BY MOUTH EVERY DAY IN THE MORNING   meloxicam 15 MG tablet Commonly known as: MOBIC Take 15 mg by mouth daily as needed.   metoprolol tartrate 50 MG tablet Commonly known as: LOPRESSOR Take 50 mg by mouth 2 (two) times daily.   multivitamin with minerals Tabs tablet Take 1 tablet by mouth daily.   phentermine 37.5 MG capsule Take 37.5 mg by mouth every morning.   predniSONE 20 MG tablet Commonly known as: DELTASONE Take 2 tablets (40 mg total) by mouth daily with breakfast for 14 days.   traZODone 150 MG tablet Commonly known as: DESYREL Take 150 mg by mouth at bedtime.       Vitals:   01/14/19 0444 01/14/19 0934  BP: (!) 149/64 (!) 152/79  Pulse: 66 71  Resp: 20   Temp: 98 F (36.7 C)   SpO2: 97%     Skin clean, dry and intact without evidence of skin break down, no evidence of skin tears noted. IV catheter discontinued intact. Site without signs and symptoms of complications. Dressing and pressure applied. Pt denies pain at this time. No complaints noted.  An After Visit Summary was printed and given to the patient. Patient escorted via [place], and D/C home via private auto.  Chuck Hint RN [facility] [employer]

## 2019-01-14 NOTE — Discharge Summary (Addendum)
[place] [facility] [place] NAME: Isaiah Walker    MR#:  [id number]  DATE OF BIRTH:  [birth year]  DATE OF ADMISSION:  01/09/2019   ADMITTING PHYSICIAN: Nicholes Mango, MD  DATE OF DISCHARGE: 01/14/2019 PRIMARY CARE PHYSICIAN: Danelle Berry, NP   ADMISSION DIAGNOSIS:  Colorectal anastomotic stricture [K91.30] DISCHARGE DIAGNOSIS:  Active Problems:   Acute abdominal pain  SECONDARY DIAGNOSIS:   Past Medical History:  Diagnosis Date  . Arthritis   . BPH (benign prostatic hyperplasia)   . Crohn's disease ([place])   . Hypertension   . Kidney stones    HOSPITAL COURSE:  #Acute lower abdominal pain with partial obstruction probably from ileocolonic anastomosis stricture Clear liquid diet. IV pain management as needed, IV antiemetics and supportive treatment Dr. Peyton Najjar recommends GI consult and colonoscopy prior to further surgical interventions.   Colonoscopy: Stricture at the colonic anastomosis; Two 4 to 6 mm polyps in the rectum and in the descending colon, removed with a cold snare per Dr. Bonna Gains. Per Dr. Zachery Dauer, No need of urgent surgical management at this moment.  Per Dr. Bonna Gains, started patient on Solu-Medrol 40 mg IV daily change to oral prednisone 40 mg p.o. daily at discharge and continue at least 2 weeks. Follow-up in GI clinic within 1 to 2 weeks of discharge.   #History of Crohn's disease status post terminal ileocecal resection Not seen by gastroenterology for Crohn's Colonoscopy as above.  #Essential hypertension Continue home medication metoprolol and Norvasc and hold Cozaar  #Benign prostatic hypertrophy Continue Proscar  Dehydration.  Improving with IV fluid support. Leukocytosis.  Possible due to reaction secondary to above.  Improved. DISCHARGE CONDITIONS:  Stable, discharge to home today. CONSULTS OBTAINED:  Treatment Team:  Herbert Pun, MD DRUG ALLERGIES:   Allergies  Allergen Reactions   . Ivp Dye [Iodinated Diagnostic Agents] Rash and Other (See Comments)    Pt has had CT scan since. Pt states "i'm fine"   DISCHARGE MEDICATIONS:   Allergies as of 01/14/2019      Reactions   Ivp Dye [iodinated Diagnostic Agents] Rash, Other (See Comments)   Pt has had CT scan since. Pt states "i'm fine"      Medication List    TAKE these medications   allopurinol 300 MG tablet Commonly known as: ZYLOPRIM Take 300 mg by mouth daily.   amLODipine 2.5 MG tablet Commonly known as: NORVASC Take 2.5 mg by mouth daily.   finasteride 5 MG tablet Commonly known as: PROSCAR Take 1 tablet (5 mg total) by mouth daily.   losartan 100 MG tablet Commonly known as: COZAAR TAKE 1 TABLET BY MOUTH EVERY DAY IN THE MORNING   meloxicam 15 MG tablet Commonly known as: MOBIC Take 15 mg by mouth daily as needed.   metoprolol tartrate 50 MG tablet Commonly known as: LOPRESSOR Take 50 mg by mouth 2 (two) times daily.   multivitamin with minerals Tabs tablet Take 1 tablet by mouth daily.   phentermine 37.5 MG capsule Take 37.5 mg by mouth every morning.   predniSONE 20 MG tablet Commonly known as: DELTASONE Take 2 tablets (40 mg total) by mouth daily with breakfast for 14 days.   traMADol 50 MG tablet Commonly known as: ULTRAM Take 1 tablet (50 mg total) by mouth every 6 (six) hours as needed for up to 3 days for moderate pain or severe pain.   traZODone 150 MG tablet Commonly known as: DESYREL Take 150 mg by mouth  at bedtime.        DISCHARGE INSTRUCTIONS:  See AVS.  If you experience worsening of your admission symptoms, develop shortness of breath, life threatening emergency, suicidal or homicidal thoughts you must seek medical attention immediately by calling 911 or calling your MD immediately  if symptoms less severe.  You Must read complete instructions/literature along with all the possible adverse reactions/side effects for all the Medicines you take and that have been  prescribed to you. Take any new Medicines after you have completely understood and accpet all the possible adverse reactions/side effects.   Please note  You were cared for by a hospitalist during your hospital stay. If you have any questions about your discharge medications or the care you received while you were in the hospital after you are discharged, you can call the unit and asked to speak with the hospitalist on call if the hospitalist that took care of you is not available. Once you are discharged, your primary care physician will handle any further medical issues. Please note that NO REFILLS for any discharge medications will be authorized once you are discharged, as it is imperative that you return to your primary care physician (or establish a relationship with a primary care physician if you do not have one) for your aftercare needs so that they can reassess your need for medications and monitor your lab values.    On the day of Discharge:  VITAL SIGNS:  Blood pressure (!) 152/79, pulse 71, temperature 98 F (36.7 C), temperature source Oral, resp. rate 20, height 5\' 6"  (1.676 m), weight 122.5 kg, SpO2 97 %. PHYSICAL EXAMINATION:  GENERAL:  73 y.o.-year-old patient lying in the bed with no acute distress.  EYES: Pupils equal, round, reactive to light and accommodation. No scleral icterus. Extraocular muscles intact.  HEENT: Head atraumatic, normocephalic. Oropharynx and nasopharynx clear.  NECK:  Supple, no jugular venous distention. No thyroid enlargement, no tenderness.  LUNGS: Normal breath sounds bilaterally, no wheezing, rales,rhonchi or crepitation. No use of accessory muscles of respiration.  CARDIOVASCULAR: S1, S2 normal. No murmurs, rubs, or gallops.  ABDOMEN: Soft, non-tender, distended. Bowel sounds present. No organomegaly or mass.  EXTREMITIES: No pedal edema, cyanosis, or clubbing.  NEUROLOGIC: Cranial nerves II through XII are intact. Muscle strength 5/5 in all  extremities. Sensation intact. Gait not checked.  PSYCHIATRIC: The patient is alert and oriented x 3.  SKIN: No obvious rash, lesion, or ulcer.  DATA REVIEW:   CBC Recent Labs  Lab 01/11/19 0544  WBC 8.6  HGB 15.3  HCT 46.8  PLT 215    Chemistries  Recent Labs  Lab 01/10/19 0358  01/14/19 0505  NA 139   < > 138  K 4.3   < > 3.7  CL 104   < > 107  CO2 23   < > 25  GLUCOSE 168*   < > 111*  BUN 23   < > 24*  CREATININE 0.97   < > 0.96  CALCIUM 9.0   < > 8.2*  MG  --    < > 2.1  AST 23  --   --   ALT 42  --   --   ALKPHOS 33*  --   --   BILITOT 1.1  --   --    < > = values in this interval not displayed.     Microbiology Results  Results for orders placed or performed during the hospital encounter of 01/09/19  SARS Coronavirus  2 (CEPHEID - Performed in [place] lab), Hosp Order     Status: None   Collection Time: 01/09/19  6:31 PM   Specimen: Nasopharyngeal Swab  Result Value Ref Range Status   SARS Coronavirus 2 NEGATIVE NEGATIVE Final    Comment: (NOTE) If result is NEGATIVE SARS-CoV-2 target nucleic acids are NOT DETECTED. The SARS-CoV-2 RNA is generally detectable in upper and lower  respiratory specimens during the acute phase of infection. The lowest  concentration of SARS-CoV-2 viral copies this assay can detect is 250  copies / mL. A negative result does not preclude SARS-CoV-2 infection  and should not be used as the sole basis for treatment or other  patient management decisions.  A negative result may occur with  improper specimen collection / handling, submission of specimen other  than nasopharyngeal swab, presence of viral mutation(s) within the  areas targeted by this assay, and inadequate number of viral copies  (<250 copies / mL). A negative result must be combined with clinical  observations, patient history, and epidemiological information. If result is POSITIVE SARS-CoV-2 target nucleic acids are DETECTED. The SARS-CoV-2 RNA is  generally detectable in upper and lower  respiratory specimens dur ing the acute phase of infection.  Positive  results are indicative of active infection with SARS-CoV-2.  Clinical  correlation with patient history and other diagnostic information is  necessary to determine patient infection status.  Positive results do  not rule out bacterial infection or co-infection with other viruses. If result is PRESUMPTIVE POSTIVE SARS-CoV-2 nucleic acids MAY BE PRESENT.   A presumptive positive result was obtained on the submitted specimen  and confirmed on repeat testing.  While 2019 novel coronavirus  (SARS-CoV-2) nucleic acids may be present in the submitted sample  additional confirmatory testing may be necessary for epidemiological  and / or clinical management purposes  to differentiate between  SARS-CoV-2 and other Sarbecovirus currently known to infect humans.  If clinically indicated additional testing with an alternate test  methodology [phone]) is advised. The SARS-CoV-2 RNA is generally  detectable in upper and lower respiratory sp ecimens during the acute  phase of infection. The expected result is Negative. Fact Sheet for Patients:  StrictlyIdeas.no Fact Sheet for Healthcare Providers: BankingDealers.co.za This test is not yet approved or cleared by the [place] FDA and has been authorized for detection and/or diagnosis of SARS-CoV-2 by FDA under an Emergency Use Authorization (EUA).  This EUA will remain in effect (meaning this test can be used) for the duration of the COVID-19 declaration under Section 564(b)(1) of the Act, 21 U.S.C. section 360bbb-3(b)(1), unless the authorization is terminated or revoked sooner. Performed at [facility], [address] [place], [place] [address]     RADIOLOGY:  No results found.   Management plans discussed with the patient, family and they are in agreement.  CODE  STATUS: Full Code   TOTAL TIME TAKING CARE OF THIS PATIENT: 32 minutes.    Demetrios Loll M.D on 01/14/2019 at 9:50 AM  Between 7am to 6pm - Pager - [phone]  After 6pm go to [email or web address] - [employer] [place]  Office  [phone]  CC: Primary care physician; Danelle Berry, NP   Note: This dictation was prepared with Dragon dictation along with smaller phrase technology. Any transcriptional errors that result from this process are unintentional.

## 2019-01-14 NOTE — TOC Transition Note (Signed)
Transition of Care [facility]) - CM/SW Discharge Note   Patient Details  Name: Isaiah Walker MRN: [id number] Date of Birth: May 10, 1946  [facility] of [facility]) CM/SW Contact:  Katrina Stack, RN Phone Number: 01/14/2019, 1:10 PM   Clinical Narrative:     no discharge needs identified by care team         Patient Goals and CMS Choice        Discharge Placement                       Discharge Plan and Services                                     Social Determinants of Health (SDOH) Interventions     Readmission Risk Interventions No flowsheet data found.

## 2019-01-14 NOTE — Care Management Important Message (Signed)
Important Message  Patient Details  Name: Isaiah Walker MRN: [id number] Date of Birth: 1946-02-15   Medicare Important Message Given:  Yes     Dannette Barbara 01/14/2019, 10:52 AM

## 2019-01-16 LAB — QUANTIFERON-TB GOLD PLUS (RQFGPL)
QuantiFERON Mitogen Value: 0.1 IU/mL
QuantiFERON Nil Value: 0.01 IU/mL
QuantiFERON TB1 Ag Value: 0.01 IU/mL
QuantiFERON TB2 Ag Value: 0.01 IU/mL

## 2019-01-16 LAB — QUANTIFERON-TB GOLD PLUS: QuantiFERON-TB Gold Plus: UNDETERMINED — AB

## 2019-01-17 ENCOUNTER — Telehealth: Payer: Self-pay | Admitting: Gastroenterology

## 2019-01-17 LAB — THIOPURINE METHYLTRANSFERASE (TPMT), RBC: TPMT Activity:: 29 Units/mL RBC

## 2019-01-17 NOTE — Telephone Encounter (Signed)
PT  Wife Hoyle Sauer  is calling stating pt has not had a bowel movement since he was released on Friday and would like to get rx for him please call her at work  cb [phone]

## 2019-01-17 NOTE — Telephone Encounter (Signed)
Patient's wife called into the office stating the patient was not able to have a bm & was in pain. Please call & advise. He had a procedure 01-11-19 with Dr T & has an appointment with Dr Marius Ditch on 02-08-19.

## 2019-01-17 NOTE — Telephone Encounter (Signed)
Pt apt has been moved to 01/25/19 pt aware

## 2019-01-17 NOTE — Telephone Encounter (Signed)
Please advise 

## 2019-01-18 ENCOUNTER — Other Ambulatory Visit: Payer: Self-pay

## 2019-01-18 DIAGNOSIS — K831 Obstruction of bile duct: Secondary | ICD-10-CM

## 2019-01-18 DIAGNOSIS — Z8719 Personal history of other diseases of the digestive system: Secondary | ICD-10-CM

## 2019-01-18 DIAGNOSIS — K9189 Other postprocedural complications and disorders of digestive system: Secondary | ICD-10-CM

## 2019-01-18 DIAGNOSIS — K632 Fistula of intestine: Secondary | ICD-10-CM

## 2019-01-18 NOTE — Telephone Encounter (Signed)
Pt has been scheduled for CT Enterography for tomorrow 01/19/2019, pt verbalized understanding

## 2019-01-19 ENCOUNTER — Ambulatory Visit: Admission: RE | Admit: 2019-01-19 | Payer: Medicare HMO | Source: Ambulatory Visit

## 2019-01-21 ENCOUNTER — Ambulatory Visit
Admission: RE | Admit: 2019-01-21 | Discharge: 2019-01-21 | Disposition: A | Discharge status: Home / Self Care | Payer: Medicare HMO | Source: Ambulatory Visit | Attending: Gastroenterology | Admitting: Gastroenterology

## 2019-01-21 ENCOUNTER — Telehealth: Payer: Self-pay | Admitting: Gastroenterology

## 2019-01-21 ENCOUNTER — Other Ambulatory Visit: Payer: Self-pay

## 2019-01-21 ENCOUNTER — Telehealth: Payer: Self-pay

## 2019-01-21 DIAGNOSIS — Z8719 Personal history of other diseases of the digestive system: Secondary | ICD-10-CM

## 2019-01-21 DIAGNOSIS — K632 Fistula of intestine: Secondary | ICD-10-CM

## 2019-01-21 MED ORDER — PREDNISONE 50 MG PO TABS: ORAL_TABLET | ORAL | 0 refills | Status: DC

## 2019-01-21 NOTE — Telephone Encounter (Signed)
Pt needs to be scheduled an appt for swollen testicles, the patient and his wife were told to call and schedule an appt last night by call a nurse but I do not see that it has been scheduled. Thanks

## 2019-01-21 NOTE — Telephone Encounter (Signed)
Called pt and lm to cb to schedule app   Sharyn Lull

## 2019-01-21 NOTE — Telephone Encounter (Signed)
Spoke with pt and informed him that we have sent the prednisone 50 mg script for the 13 hour prep prior to his CT scan. Pt understands the directions on how to take.

## 2019-01-21 NOTE — Telephone Encounter (Signed)
Patient's wife called stating patent is having sever diarrhea since last night. This morning he has taken an imodium but this does not seem to have helped. Please advise. Patient uses [facility] in [place].

## 2019-01-24 ENCOUNTER — Ambulatory Visit
Admission: RE | Admit: 2019-01-24 | Discharge: 2019-01-24 | Disposition: A | Discharge status: Home / Self Care | Payer: Medicare HMO | Source: Ambulatory Visit | Attending: Gastroenterology | Admitting: Gastroenterology

## 2019-01-24 ENCOUNTER — Other Ambulatory Visit: Payer: Self-pay

## 2019-01-24 DIAGNOSIS — K632 Fistula of intestine: Secondary | ICD-10-CM | POA: Diagnosis not present

## 2019-01-24 DIAGNOSIS — Z8719 Personal history of other diseases of the digestive system: Secondary | ICD-10-CM | POA: Diagnosis present

## 2019-01-24 MED ORDER — IOHEXOL 300 MG/ML  SOLN
125.0000 mL | Freq: Once | INTRAMUSCULAR | Status: AC | PRN
  Administered 2019-01-24: 12:00:00 125 mL via INTRAVENOUS

## 2019-01-25 ENCOUNTER — Other Ambulatory Visit: Payer: Self-pay

## 2019-01-25 ENCOUNTER — Ambulatory Visit: Payer: Medicare HMO | Admitting: Gastroenterology

## 2019-01-25 ENCOUNTER — Encounter: Payer: Self-pay | Admitting: Gastroenterology

## 2019-01-25 VITALS — BP 170/82 | HR 62 | Temp 97.1°F | Resp 17 | Ht 66.0 in | Wt 262.4 lb

## 2019-01-25 DIAGNOSIS — K50012 Crohn's disease of small intestine with intestinal obstruction: Secondary | ICD-10-CM | POA: Diagnosis not present

## 2019-01-25 DIAGNOSIS — K56699 Other intestinal obstruction unspecified as to partial versus complete obstruction: Secondary | ICD-10-CM

## 2019-01-25 MED ORDER — RIFAXIMIN 550 MG PO TABS: 550.0000 mg | ORAL_TABLET | Freq: Two times a day (BID) | ORAL | 0 refills | Status: AC

## 2019-01-25 NOTE — Progress Notes (Signed)
Isaiah Darby, MD [address]  [place]  [place], [place] [address]  Main: [phone]  Fax: [phone]    Gastroenterology Consultation  Referring Provider:     Danelle Berry, NP Primary Care Physician:  Danelle Berry, NP Primary Gastroenterologist:  Dr. Cephas Walker Reason for Consultation:     History of small bowel Crohn's        HPI:   Isaiah Walker is a 73 y.o. male referred by Dr. Danelle Berry, NP  for consultation & management of history of small bowel Crohn's.  Approximately 20 years ago patient had sudden onset of lower abdominal pain, was initially thought to have ventral hernia and underwent repair.  4 weeks later, he had worsening of pain and was found to have small bowel obstruction, underwent emergent ileocecal resection with primary anastomosis by Dr. Bary Castilla.  He was told he has Crohn's disease and he was on oral medication briefly.  Patient was recently admitted to [facility] with severe abdominal distention, lower abdominal pain, CT revealed small bowel obstruction at the ileocolonic anastomosis.  He was managed conservatively, after he started having bowel movements, he underwent colonoscopy and was found to have ulcerated ileocolonic anastomotic stricture, unable to traverse with adult colonoscope.  Rest of the colon appeared normal.  Biopsies revealed  focal ulceration of the anastomosis and mild architectural distortion in the colon.  He was discharged home on prednisone.  Interval summary Patient is currently taking prednisone 40 mg daily.  Since discharge, he reports not having regular bowel movements.  Has experienced severe diarrhea for about 2 days and his bowels are not back to normal yet.  He does report abdominal distention, denies any abdominal pain.  He does report postprandial urgency.  Patient underwent follow-up CT enterography on 8/10 which reveals interval resolution of equalization of distal small bowel loops and no evidence of  active Crohn's disease or stricture.  He denies rectal bleeding.  Patient is morbidly obese, has ventral hernia He does not smoke and never smoked.  He does not drink alcohol  NSAIDs: None  Antiplts/Anticoagulants/Anti thrombotics: None  GI Procedures: Colonoscopy 01/11/2019 - Preparation of the colon was fair. - Stricture at the colonic anastomosis. Biopsied. - It would be difficult to say that the stricture is from Crohns disease, given no other findings of Crohns besides a localized stricture at the anastomosis. This may be a post surgical stricture. Biopsies may help in this case. - Two 4 to 6 mm polyps in the rectum and in the descending colon, removed with a cold snare. Resected and retrieved. - Normal mucosa in the entire examined colon.  DIAGNOSIS:  A. COLON, STRICTURE ANASTOMOSIS; COLD BIOPSY:  - ENTERIC MUCOSA WITH ARCHITECTURAL DISTORTION AND ACUTE ULCER.  - NEGATIVE FOR DYSPLASIA AND MALIGNANCY.  - SEE COMMENT.   Comment:  There is focal active inflammation in the area of ulceration (one tissue  fragment). The remainder of the biopsies are consistent with chronic  quiescent colitis. Clinical correlation is recommended.   B. COLON, RIGHT; COLD BIOPSY:  - COLONIC MUCOSA WITH MILD ARCHITECTURAL DISTORTION, CONSISTENT WITH THE  REPORTED HISTORY OF CROHN'S DISEASE.  - NEGATIVE FOR ACTIVE INFLAMMATION, DYSPLASIA, AND MALIGNANCY.   C. COLON POLYP, DESCENDING; COLD SNARE:  - TUBULAR ADENOMA.  - NEGATIVE FOR HIGH-GRADE DYSPLASIA AND MALIGNANCY.   D. COLON, LEFT; COLD BIOPSY:  - COLONIC MUCOSA WITH MILD ARCHITECTURAL DISTORTION, CONSISTENT WITH THE  REPORTED HISTORY OF CROHN'S DISEASE.  - NEGATIVE  FOR ACTIVE INFLAMMATION, DYSPLASIA, AND MALIGNANCY.   E. COLON POLYP, RECTOSIGMOID; COLD SNARE:  - HYPERPLASTIC POLYP.  - NEGATIVE FOR DYSPLASIA AND MALIGNANCY.    Past Medical History:  Diagnosis Date  . Arthritis   . BPH (benign prostatic hyperplasia)   . Crohn's  disease ([place])   . Hypertension   . Kidney stones     Past Surgical History:  Procedure Laterality Date  . CHOLECYSTECTOMY    . CHOLECYSTECTOMY, LAPAROSCOPIC N/A   . COLON SURGERY     Colectomy 2005  . COLONOSCOPY N/A 01/11/2019   Procedure: COLONOSCOPY;  Surgeon: Virgel Manifold, MD;  Location: [facility] ENDOSCOPY;  Service: Endoscopy;  Laterality: N/A;  . CYSTOSCOPY W/ RETROGRADES Bilateral 04/01/2017   Procedure: CYSTOSCOPY WITH RETROGRADE PYELOGRAM;  Surgeon: Hollice Espy, MD;  Location: [facility] ORS;  Service: Urology;  Laterality: Bilateral;  . CYSTOSCOPY WITH STENT PLACEMENT Bilateral 04/01/2017   Procedure: CYSTOSCOPY WITH STENT PLACEMENT;  Surgeon: Hollice Espy, MD;  Location: [facility] ORS;  Service: Urology;  Laterality: Bilateral;  . CYSTOSCOPY/URETEROSCOPY/HOLMIUM LASER/STENT PLACEMENT Left 10/16/2014   Procedure: CYSTOSCOPY/URETEROSCOPY/HOLMIUM LASER/STENT PLACEMENT;  Surgeon: Hollice Espy, MD;  Location: [facility] ORS;  Service: Urology;  Laterality: Left;  . CYSTOSCOPY/URETEROSCOPY/HOLMIUM LASER/STENT PLACEMENT Bilateral 04/20/2017   Procedure: CYSTOSCOPY/URETEROSCOPY/HOLMIUM LASER/STENT EXCHANGE;  Surgeon: Hollice Espy, MD;  Location: [facility] ORS;  Service: Urology;  Laterality: Bilateral;  . HERNIA REPAIR    . Percutaneous Left    Nephrolithotomy    Current Outpatient Medications:  .  allopurinol (ZYLOPRIM) 300 MG tablet, Take 300 mg by mouth daily., Disp: , Rfl:  .  amLODipine (NORVASC) 2.5 MG tablet, Take 2.5 mg by mouth daily., Disp: , Rfl:  .  finasteride (PROSCAR) 5 MG tablet, Take 1 tablet (5 mg total) by mouth daily., Disp: 90 tablet, Rfl: 5 .  losartan (COZAAR) 100 MG tablet, TAKE 1 TABLET BY MOUTH EVERY DAY IN THE MORNING, Disp: , Rfl:  .  meloxicam (MOBIC) 15 MG tablet, Take 15 mg by mouth daily as needed. , Disp: , Rfl:  .  metoprolol (LOPRESSOR) 50 MG tablet, Take 50 mg by mouth 2 (two) times daily., Disp: , Rfl:  .  Multiple Vitamin (MULTIVITAMIN WITH MINERALS)  TABS tablet, Take 1 tablet by mouth daily., Disp: , Rfl:  .  oxyCODONE-acetaminophen (PERCOCET/ROXICET) 5-325 MG tablet, TAKE 1 TABLET TWICE A DAY AS NEEDED FOR SEVERE PAIN, Disp: , Rfl:  .  phentermine 37.5 MG capsule, Take 37.5 mg by mouth every morning., Disp: , Rfl:  .  predniSONE (DELTASONE) 10 MG tablet, TAKE 4 TABLETS BY MOUTH EVERY DAY FOR 14 DAYS, Disp: , Rfl:  .  predniSONE (DELTASONE) 20 MG tablet, Take 2 tablets (40 mg total) by mouth daily with breakfast for 14 days., Disp: 28 tablet, Rfl: 0 .  predniSONE (DELTASONE) 50 MG tablet, Take 1 tab at 13 hours, 7 hours, and 1 hour before exam, Disp: 3 tablet, Rfl: 0 .  traZODone (DESYREL) 150 MG tablet, Take 150 mg by mouth at bedtime., Disp: , Rfl:  .  rifaximin (XIFAXAN) 550 MG TABS tablet, Take 1 tablet (550 mg total) by mouth 2 (two) times daily for 14 days., Disp: 28 tablet, Rfl: 0  No family history on file.   Social History   Tobacco Use  . Smoking status: Never Smoker  . Smokeless tobacco: Never Used  Substance Use Topics  . Alcohol use: No  . Drug use: No    Allergies as of 01/25/2019  . (No Known Allergies)  Review of Systems:    All systems reviewed and negative except where noted in HPI.   Physical Exam:  BP (!) 170/82 (BP Location: Left Arm, Patient Position: Sitting, Cuff Size: Large)   Pulse 62   Temp (!) 97.1 F (36.2 C)   Resp 17   Ht 5\' 6"  (1.676 m)   Wt 262 lb 6.4 oz (119 kg)   BMI 42.35 kg/m  No LMP for male patient.  General:   Alert,  Well-developed, well-nourished, pleasant and cooperative in NAD Head:  Normocephalic and atraumatic. Eyes:  Sclera clear, no icterus.   Conjunctiva pink. Ears:  Normal auditory acuity. Nose:  No deformity, discharge, or lesions. Mouth:  No deformity or lesions,oropharynx pink & moist. Neck:  Supple; no masses or thyromegaly. Lungs:  Respirations even and unlabored.  Clear throughout to auscultation.   No wheezes, crackles, or rhonchi. No acute  distress. Heart:  Regular rate and rhythm; no murmurs, clicks, rubs, or gallops. Abdomen:  Normal bowel sounds. Soft, morbidly obese, ventral hernia, non-tender and mildly distended, tympanic to percussion, without masses, hepatosplenomegaly or hernias noted.  No guarding or rebound tenderness.   Rectal: Not performed Msk:  Symmetrical without gross deformities. Good, equal movement & strength bilaterally. Pulses:  Normal pulses noted. Extremities:  No clubbing or edema.  No cyanosis. Neurologic:  Alert and oriented x3;  grossly normal neurologically. Skin:  Intact without significant lesions or rashes. No jaundice. Psych:  Alert and cooperative. Normal mood and affect.  Imaging Studies: Reviewed  Assessment and Plan:   Isaiah Walker is a 73 y.o. male with morbid obesity, hypertension, history of small bowel Crohn's status post ileocolonic resection with primary anastomosis due to small bowel obstruction, recurrence of partial small bowel obstruction at the ileocolonic anastomosis in 12/2018 resolved with conservative management and prednisone, possible anastomotic stricture with ulceration  Recommend repeat colonoscopy with pediatric colonoscope to evaluate the stricture with possible dilation and neoterminal ileum Recommend 2 weeks course of rifaximin for possible bacterial overgrowth Start prednisone taper Depending on what the repeat colonoscopy shows, will recommend immunosuppressive therapy Encouraged him to avoid high roughage foods Recommend soluble fiber such as Benefiber, Citrucel, Metamucil as needed Avoid red meat  Follow up in 1 month   Isaiah Darby, MD

## 2019-01-27 ENCOUNTER — Telehealth: Payer: Self-pay | Admitting: Gastroenterology

## 2019-01-27 NOTE — Telephone Encounter (Signed)
Pt is calling to check on status of prev. message

## 2019-01-27 NOTE — Telephone Encounter (Signed)
Patient called & states the insurance will not cover the last medication  Prescribed. He would need to pay $400. Is there another medication he can take? Call into the [facility]. He does not know the name of the medication that was prescribed. Please call & advise.

## 2019-01-27 NOTE — Telephone Encounter (Signed)
Prescription has been sent to specialty pharmacy, waiting for approval, pt has been notified and verbalized understanding

## 2019-01-31 NOTE — Telephone Encounter (Signed)
Patient called & would like Isaiah Walker to call him. He spoke to her but did not understand.

## 2019-02-01 NOTE — Telephone Encounter (Signed)
Patient called today & states [facility] has called him about the medication,but he can not afford it. Please call him with an alternative.

## 2019-02-02 NOTE — Telephone Encounter (Signed)
Pt is returning  A call for [facility]

## 2019-02-03 NOTE — Telephone Encounter (Signed)
Pt will pick up samples in office today

## 2019-02-08 ENCOUNTER — Other Ambulatory Visit
Admission: RE | Admit: 2019-02-08 | Discharge: 2019-02-08 | Disposition: A | Discharge status: Home / Self Care | Payer: Medicare HMO | Source: Ambulatory Visit | Attending: Gastroenterology | Admitting: Gastroenterology

## 2019-02-08 ENCOUNTER — Other Ambulatory Visit: Payer: Self-pay

## 2019-02-08 ENCOUNTER — Ambulatory Visit: Payer: Medicare HMO | Admitting: Gastroenterology

## 2019-02-08 DIAGNOSIS — Z20828 Contact with and (suspected) exposure to other viral communicable diseases: Secondary | ICD-10-CM | POA: Diagnosis not present

## 2019-02-08 DIAGNOSIS — Z01812 Encounter for preprocedural laboratory examination: Secondary | ICD-10-CM | POA: Diagnosis not present

## 2019-02-08 LAB — SARS CORONAVIRUS 2 (TAT 6-24 HRS): SARS Coronavirus 2: NEGATIVE

## 2019-02-10 NOTE — Telephone Encounter (Signed)
Pt left vm he needs instructions on his prep kit please call pt

## 2019-02-11 ENCOUNTER — Ambulatory Visit: Payer: Medicare HMO | Admitting: Anesthesiology

## 2019-02-11 ENCOUNTER — Other Ambulatory Visit: Payer: Self-pay

## 2019-02-11 ENCOUNTER — Encounter
Admission: RE | Disposition: A | Discharge status: Home / Self Care | Payer: Self-pay | Source: Home / Self Care | Attending: Gastroenterology

## 2019-02-11 ENCOUNTER — Ambulatory Visit
Admission: RE | Admit: 2019-02-11 | Discharge: 2019-02-11 | Disposition: A | Discharge status: Home / Self Care | Payer: Medicare HMO | Attending: Gastroenterology | Admitting: Gastroenterology

## 2019-02-11 DIAGNOSIS — K56699 Other intestinal obstruction unspecified as to partial versus complete obstruction: Secondary | ICD-10-CM

## 2019-02-11 DIAGNOSIS — I10 Essential (primary) hypertension: Secondary | ICD-10-CM | POA: Insufficient documentation

## 2019-02-11 DIAGNOSIS — Z980 Intestinal bypass and anastomosis status: Secondary | ICD-10-CM | POA: Diagnosis not present

## 2019-02-11 DIAGNOSIS — Z791 Long term (current) use of non-steroidal anti-inflammatories (NSAID): Secondary | ICD-10-CM | POA: Insufficient documentation

## 2019-02-11 DIAGNOSIS — N400 Enlarged prostate without lower urinary tract symptoms: Secondary | ICD-10-CM | POA: Diagnosis not present

## 2019-02-11 DIAGNOSIS — K5000 Crohn's disease of small intestine without complications: Secondary | ICD-10-CM | POA: Insufficient documentation

## 2019-02-11 DIAGNOSIS — M1990 Unspecified osteoarthritis, unspecified site: Secondary | ICD-10-CM | POA: Diagnosis not present

## 2019-02-11 DIAGNOSIS — Z79899 Other long term (current) drug therapy: Secondary | ICD-10-CM | POA: Diagnosis not present

## 2019-02-11 HISTORY — PX: COLONOSCOPY WITH PROPOFOL: SHX5780

## 2019-02-11 SURGERY — COLONOSCOPY WITH PROPOFOL
Anesthesia: General

## 2019-02-11 MED ORDER — PROPOFOL 10 MG/ML IV BOLUS
INTRAVENOUS | Status: DC | PRN
  Administered 2019-02-11: 70 mg via INTRAVENOUS

## 2019-02-11 MED ORDER — SODIUM CHLORIDE 0.9 % IV SOLN
INTRAVENOUS | Status: DC
  Administered 2019-02-11: 10:00:00 via INTRAVENOUS

## 2019-02-11 MED ORDER — PHENYLEPHRINE HCL (PRESSORS) 10 MG/ML IV SOLN
INTRAVENOUS | Status: DC | PRN
  Administered 2019-02-11 (×2): 100 ug via INTRAVENOUS

## 2019-02-11 MED ORDER — PROPOFOL 500 MG/50ML IV EMUL
INTRAVENOUS | Status: DC | PRN
  Administered 2019-02-11: 150 ug/kg/min via INTRAVENOUS

## 2019-02-11 NOTE — Anesthesia Preprocedure Evaluation (Signed)
Anesthesia Evaluation  Patient identified by MRN, date of birth, ID band Patient awake    Reviewed: Allergy & Precautions, NPO status , Patient's Chart, lab work & pertinent test results  History of Anesthesia Complications Negative for: history of anesthetic complications  Airway Mallampati: II  TM Distance: >3 FB Neck ROM: Full    Dental  (+) Poor Dentition   Pulmonary neg pulmonary ROS, neg sleep apnea, neg COPD,    breath sounds clear to auscultation- rhonchi (-) wheezing      Cardiovascular hypertension, Pt. on medications (-) CAD, (-) Past MI, (-) Cardiac Stents and (-) CABG  Rhythm:Regular Rate:Normal - Systolic murmurs and - Diastolic murmurs    Neuro/Psych neg Seizures negative neurological ROS  negative psych ROS   GI/Hepatic negative GI ROS, Neg liver ROS,   Endo/Other  negative endocrine ROSneg diabetes  Renal/GU Renal disease: hx of nephrolithiasis.     Musculoskeletal  (+) Arthritis ,   Abdominal (+) + obese,   Peds  Hematology negative hematology ROS (+)   Anesthesia Other Findings Past Medical History: No date: Arthritis No date: BPH (benign prostatic hyperplasia) No date: Crohn's disease ([facility]) No date: Hypertension No date: Kidney stones   Reproductive/Obstetrics                             Anesthesia Physical Anesthesia Plan  ASA: II  Anesthesia Plan: General   Post-op Pain Management:    Induction: Intravenous  PONV Risk Score and Plan: 1 and Propofol infusion  Airway Management Planned: Natural Airway  Additional Equipment:   Intra-op Plan:   Post-operative Plan:   Informed Consent: I have reviewed the patients History and Physical, chart, labs and discussed the procedure including the risks, benefits and alternatives for the proposed anesthesia with the patient or authorized representative who has indicated his/her understanding and acceptance.      Dental advisory given  Plan Discussed with: CRNA and Anesthesiologist  Anesthesia Plan Comments:         Anesthesia Quick Evaluation  

## 2019-02-11 NOTE — Transfer of Care (Signed)
Immediate Anesthesia Transfer of Care Note  Patient: Isaiah Walker  Procedure(s) Performed: COLONOSCOPY WITH PROPOFOL (N/A )  Patient Location: PACU  Anesthesia Type:General  Level of Consciousness: awake and sedated  Airway & Oxygen Therapy: Patient Spontanous Breathing and Patient connected to nasal cannula oxygen  Post-op Assessment: Report given to RN and Post -op Vital signs reviewed and stable  Post vital signs: Reviewed and stable  Last Vitals:  Vitals Value Taken Time  BP 137/65 02/11/19 1025  Temp 36.3 C 02/11/19 1019  Pulse 55 02/11/19 1025  Resp 14 02/11/19 1025  SpO2 97 % 02/11/19 1025  Vitals shown include unvalidated device data.  Last Pain:  Vitals:   02/11/19 1019  TempSrc: Tympanic  PainSc: Asleep         Complications: No apparent anesthesia complications

## 2019-02-11 NOTE — Op Note (Signed)
[facility] Gastroenterology Patient Name: Isaiah Walker Procedure Date: 02/11/2019 9:24 AM MRN: [id number] Account #: [id number] Date of Birth: 04-13-1946 Admit Type: Outpatient Age: 73 Room: [facility] ENDO ROOM 1 Gender: Male Note Status: Finalized Procedure:            Colonoscopy Indications:          For therapy of Crohn's disease of the small bowel,                        stricture at the ileocolonic anastomosis Providers:            Lin Landsman MD, MD Medicines:            Monitored Anesthesia Care Complications:        No immediate complications. Estimated blood loss: None. Procedure:            Pre-Anesthesia Assessment:                       - Prior to the procedure, a History and Physical was                        performed, and patient medications and allergies were                        reviewed. The patient is competent. The risks and                        benefits of the procedure and the sedation options and                        risks were discussed with the patient. All questions                        were answered and informed consent was obtained.                        Patient identification and proposed procedure were                        verified by the physician, the nurse, the                        anesthesiologist, the anesthetist and the technician in                        the pre-procedure area in the procedure room in the                        endoscopy suite. Mental Status Examination: alert and                        oriented. Airway Examination: normal oropharyngeal                        airway and neck mobility. Respiratory Examination:                        clear to auscultation. CV Examination: normal.  Prophylactic Antibiotics: The patient does not require                        prophylactic antibiotics. Prior Anticoagulants: The                        patient has taken no previous anticoagulant or                         antiplatelet agents. ASA Grade Assessment: II - A                        patient with mild systemic disease. After reviewing the                        risks and benefits, the patient was deemed in                        satisfactory condition to undergo the procedure. The                        anesthesia plan was to use monitored anesthesia care                        (MAC). Immediately prior to administration of                        medications, the patient was re-assessed for adequacy                        to receive sedatives. The heart rate, respiratory rate,                        oxygen saturations, blood pressure, adequacy of                        pulmonary ventilation, and response to care were                        monitored throughout the procedure. The physical status                        of the patient was re-assessed after the procedure.                       After obtaining informed consent, the colonoscope was                        passed under direct vision. Throughout the procedure,                        the patient's blood pressure, pulse, and oxygen                        saturations were monitored continuously. The                        Colonoscope was introduced through the anus and  advanced to the the ileocolonic anastomosis. The                        colonoscopy was performed without difficulty. The                        patient tolerated the procedure well. The quality of                        the bowel preparation was adequate. Findings:      The perianal and digital rectal examinations were normal. Pertinent       negatives include normal sphincter tone and no palpable rectal lesions.      There was evidence of a prior end-to-side ileo-colonic anastomosis at 80       cm proximal to the anus. This was patent and was characterized by mild       stenosis. The anastomosis was traversed with mild resistance.  A TTS       dilator was passed through the scope. Dilation with a 12-13.5-15 mm       colonic balloon dilator was performed. The dilation site was examined       following endoscope reinsertion and showed complete resolution of       luminal narrowing. Estimated blood loss was minimal.      The neo-terminal ileum appeared normal. Biopsies were taken with a cold       forceps for histology.      Normal mucosa was found in the entire colon.      The retroflexed view of the distal rectum and anal verge was normal and       showed no anal or rectal abnormalities. Impression:           - Patent end-to-side ileo-colonic anastomosis,                        characterized by mild stenosis. Dilated.                       - The examined portion of the ileum was normal.                        Biopsied.                       - Normal mucosa in the entire examined colon.                       - The distal rectum and anal verge are normal on                        retroflexion view. Recommendation:       - Discharge patient to home (with escort).                       - Resume previous diet today.                       - Continue present medications.                       - Await pathology results.                       -  Return to my office as previously scheduled. Procedure Code(s):    --- Professional ---                       [id number], Colonoscopy, flexible; with transendoscopic                        balloon dilation                       45380, Colonoscopy, flexible; with biopsy, single or                        multiple Diagnosis Code(s):    --- Professional ---                       Z98.0, Intestinal bypass and anastomosis status                       K50.00, Crohn's disease of small intestine without                        complications CPT copyright 2019 American Medical Association. All rights reserved. The codes documented in this report are preliminary and upon coder review may  be  revised to meet current compliance requirements. Dr. Ulyess Mort Lin Landsman MD, MD 02/11/2019 10:22:37 AM This report has been signed electronically. Number of Addenda: 0 Note Initiated On: 02/11/2019 9:24 AM Scope Withdrawal Time: 0 hours 15 minutes 35 seconds  Total Procedure Duration: 0 hours 16 minutes 31 seconds  Estimated Blood Loss: Estimated blood loss: none.      [facility]

## 2019-02-11 NOTE — Anesthesia Postprocedure Evaluation (Signed)
Anesthesia Post Note  Patient: Isaiah Walker  Procedure(s) Performed: COLONOSCOPY WITH PROPOFOL (N/A )  Patient location during evaluation: Endoscopy Anesthesia Type: General Level of consciousness: awake and alert Pain management: pain level controlled Vital Signs Assessment: post-procedure vital signs reviewed and stable Respiratory status: spontaneous breathing, nonlabored ventilation, respiratory function stable and patient connected to nasal cannula oxygen Cardiovascular status: blood pressure returned to baseline and stable Postop Assessment: no apparent nausea or vomiting Anesthetic complications: no     Last Vitals:  Vitals:   02/11/19 1025 02/11/19 1029  BP: 137/65 (!) 92/48  Pulse: (!) 57 (!) 59  Resp: 14 16  Temp:    SpO2: 96% 97%    Last Pain:  Vitals:   02/11/19 1029  TempSrc:   PainSc: 0-No pain                 Martha Clan

## 2019-02-11 NOTE — H&P (Signed)
Cephas Darby, MD [address]  [place]  [place], [place] [address]  Main: [phone]  Fax: [phone] Pager: [phone]  Primary Care Physician:  [name], MD Primary Gastroenterologist:  Dr. Cephas Darby  Pre-Procedure History & Physical: HPI:  Isaiah Walker is a 73 y.o. male is here for an colonoscopy.   Past Medical History:  Diagnosis Date  . Arthritis   . BPH (benign prostatic hyperplasia)   . Crohn's disease ([place])   . Hypertension   . Kidney stones     Past Surgical History:  Procedure Laterality Date  . CHOLECYSTECTOMY    . CHOLECYSTECTOMY, LAPAROSCOPIC N/A   . COLON SURGERY     Colectomy 2005  . COLONOSCOPY N/A 01/11/2019   Procedure: COLONOSCOPY;  Surgeon: Virgel Manifold, MD;  Location: [facility] ENDOSCOPY;  Service: Endoscopy;  Laterality: N/A;  . CYSTOSCOPY W/ RETROGRADES Bilateral 04/01/2017   Procedure: CYSTOSCOPY WITH RETROGRADE PYELOGRAM;  Surgeon: Hollice Espy, MD;  Location: [facility] ORS;  Service: Urology;  Laterality: Bilateral;  . CYSTOSCOPY WITH STENT PLACEMENT Bilateral 04/01/2017   Procedure: CYSTOSCOPY WITH STENT PLACEMENT;  Surgeon: Hollice Espy, MD;  Location: [facility] ORS;  Service: Urology;  Laterality: Bilateral;  . CYSTOSCOPY/URETEROSCOPY/HOLMIUM LASER/STENT PLACEMENT Left 10/16/2014   Procedure: CYSTOSCOPY/URETEROSCOPY/HOLMIUM LASER/STENT PLACEMENT;  Surgeon: Hollice Espy, MD;  Location: [facility] ORS;  Service: Urology;  Laterality: Left;  . CYSTOSCOPY/URETEROSCOPY/HOLMIUM LASER/STENT PLACEMENT Bilateral 04/20/2017   Procedure: CYSTOSCOPY/URETEROSCOPY/HOLMIUM LASER/STENT EXCHANGE;  Surgeon: Hollice Espy, MD;  Location: [facility] ORS;  Service: Urology;  Laterality: Bilateral;  . HERNIA REPAIR    . Percutaneous Left    Nephrolithotomy    Prior to Admission medications   Medication Sig Start Date End Date Taking? Authorizing Provider  allopurinol (ZYLOPRIM) 300 MG tablet Take 300 mg by mouth daily.    [provider]   amLODipine (NORVASC) 2.5 MG tablet Take 2.5 mg by mouth daily.    [provider]  finasteride (PROSCAR) 5 MG tablet Take 1 tablet (5 mg total) by mouth daily. 04/20/15   Nickie Retort, MD  losartan (COZAAR) 100 MG tablet TAKE 1 TABLET BY MOUTH EVERY DAY IN THE MORNING 07/19/18   [provider]  meloxicam (MOBIC) 15 MG tablet Take 15 mg by mouth daily as needed.  07/25/18   [provider]  metoprolol (LOPRESSOR) 50 MG tablet Take 50 mg by mouth 2 (two) times daily.    [provider]  Multiple Vitamin (MULTIVITAMIN WITH MINERALS) TABS tablet Take 1 tablet by mouth daily.    [provider]  oxyCODONE-acetaminophen (PERCOCET/ROXICET) 5-325 MG tablet TAKE 1 TABLET TWICE A DAY AS NEEDED FOR SEVERE PAIN 01/19/19   [provider]  phentermine 37.5 MG capsule Take 37.5 mg by mouth every morning.    [provider]  predniSONE (DELTASONE) 10 MG tablet TAKE 4 TABLETS BY MOUTH EVERY DAY FOR 14 DAYS 01/14/19   [provider]  predniSONE (DELTASONE) 50 MG tablet Take 1 tab at 13 hours, 7 hours, and 1 hour before exam 01/21/19   Vonda Antigua B, MD  traZODone (DESYREL) 150 MG tablet Take 150 mg by mouth at bedtime.    [provider]    Allergies as of 01/25/2019  . (No Known Allergies)    History reviewed. No pertinent family history.  Social History   Socioeconomic History  . Marital status: Married    Spouse name: Not on file  . Number of children: Not on file  . Years of  education: Not on file  . Highest education level: Not on file  Occupational History  . Not on file  Social Needs  . Financial resource strain: Not on file  . Food insecurity    Worry: Not on file    Inability: Not on file  . Transportation needs    Medical: Not on file    Non-medical: Not on file  Tobacco Use  . Smoking status: Never Smoker  . Smokeless tobacco: Never Used  Substance and Sexual Activity  . Alcohol use: No  . Drug  use: No  . Sexual activity: Not on file  Lifestyle  . Physical activity    Days per week: Not on file    Minutes per session: Not on file  . Stress: Not on file  Relationships  . Social Herbalist on phone: Not on file    Gets together: Not on file    Attends religious service: Not on file    Active member of club or organization: Not on file    Attends meetings of clubs or organizations: Not on file    Relationship status: Not on file  . Intimate partner violence    Fear of current or ex partner: Not on file    Emotionally abused: Not on file    Physically abused: Not on file    Forced sexual activity: Not on file  Other Topics Concern  . Not on file  Social History Narrative  . Not on file    Review of Systems: See HPI, otherwise negative ROS  Physical Exam: BP 137/65   Pulse (!) 57   Temp (!) 97.4 F (36.3 C) (Tympanic)   Resp 14   Ht 5\' 6"  (1.676 m)   Wt 257 lb (116.6 kg)   SpO2 96%   BMI 41.48 kg/m  General:   Alert,  pleasant and cooperative in NAD Head:  Normocephalic and atraumatic. Neck:  Supple; no masses or thyromegaly. Lungs:  Clear throughout to auscultation.    Heart:  Regular rate and rhythm. Abdomen:  Soft, nontender and nondistended. Normal bowel sounds, without guarding, and without rebound.   Neurologic:  Alert and  oriented x4;  grossly normal neurologically.  Impression/Plan: Isaiah Walker is here for an colonoscopy to be performed for h/o of crohn's disease of small intestine  Risks, benefits, limitations, and alternatives regarding  colonoscopy have been reviewed with the patient.  Questions have been answered.  All parties agreeable.   Sherri Sear, MD  02/11/2019, 10:33 AM

## 2019-02-11 NOTE — Anesthesia Procedure Notes (Signed)
Date/Time: 02/11/2019 10:03 AM Performed by: Nelda Marseille, CRNA Pre-anesthesia Checklist: Patient identified, Emergency Drugs available, Suction available, Patient being monitored and Timeout performed Oxygen Delivery Method: Nasal cannula

## 2019-02-11 NOTE — Anesthesia Post-op Follow-up Note (Signed)
Anesthesia QCDR form completed.        

## 2019-02-14 LAB — SURGICAL PATHOLOGY

## 2019-02-28 ENCOUNTER — Encounter: Payer: Self-pay | Admitting: Gastroenterology

## 2019-02-28 ENCOUNTER — Encounter (INDEPENDENT_AMBULATORY_CARE_PROVIDER_SITE_OTHER): Payer: Self-pay

## 2019-02-28 ENCOUNTER — Ambulatory Visit: Payer: Medicare HMO | Admitting: Gastroenterology

## 2019-02-28 ENCOUNTER — Other Ambulatory Visit: Payer: Self-pay

## 2019-02-28 VITALS — BP 152/81 | HR 72 | Temp 97.9°F | Wt 263.5 lb

## 2019-02-28 DIAGNOSIS — K50012 Crohn's disease of small intestine with intestinal obstruction: Secondary | ICD-10-CM | POA: Diagnosis not present

## 2019-02-28 NOTE — Progress Notes (Signed)
Cephas Darby, MD [address]  [place]  [place], [place] [address]  Main: [phone]  Fax: [phone]    Gastroenterology Consultation  Referring Provider:     Danelle Berry, NP Primary Care Physician:  [name], MD Primary Gastroenterologist:  Dr. Cephas Darby Reason for Consultation:     History of small bowel Crohn's        HPI:   Isaiah Walker is a 73 y.o. male referred by Dr. [name], MD  for consultation & management of history of small bowel Crohn's.  Approximately 20 years ago patient had sudden onset of lower abdominal pain, was initially thought to have ventral hernia and underwent repair.  4 weeks later, he had worsening of pain and was found to have small bowel obstruction, underwent emergent ileocecal resection with primary anastomosis by Dr. Bary Castilla.  He was told he has Crohn's disease and he was on oral medication briefly.  Patient was recently admitted to [facility] with severe abdominal distention, lower abdominal pain, CT revealed small bowel obstruction at the ileocolonic anastomosis.  He was managed conservatively, after he started having bowel movements, he underwent colonoscopy and was found to have ulcerated ileocolonic anastomotic stricture, unable to traverse with adult colonoscope.  Rest of the colon appeared normal.  Biopsies revealed  focal ulceration of the anastomosis and mild architectural distortion in the colon.  He was discharged home on prednisone.  Interval summary Patient is currently taking prednisone 40 mg daily.  Since discharge, he reports not having regular bowel movements.  Has experienced severe diarrhea for about 2 days and his bowels are not back to normal yet.  He does report abdominal distention, denies any abdominal pain.  He does report postprandial urgency.  Patient underwent follow-up CT enterography on 8/10 which reveals interval resolution of equalization of distal small bowel loops and no evidence of active  Crohn's disease or stricture.  He denies rectal bleeding.  Patient is morbidly obese, has ventral hernia He does not smoke and never smoked.  He does not drink alcohol  Follow-up visit 02/28/2019 Patient underwent repeat colonoscopy with evaluation of ileocolonic stricture which was dilated to 15 mm.  The neoterminal ileum appeared normal including biopsies.  There was no evidence of inflammation.  Patient denies any symptoms of bowel obstruction today.  He denies abdominal pain, nausea, vomiting.  He does report intermittent nonbloody loose stools.  He is finishing 2 weeks course of rifaximin.  NSAIDs: None  Antiplts/Anticoagulants/Anti thrombotics: None  GI Procedures: Colonoscopy 01/11/2019 - Preparation of the colon was fair. - Stricture at the colonic anastomosis. Biopsied. - It would be difficult to say that the stricture is from Crohns disease, given no other findings of Crohns besides a localized stricture at the anastomosis. This may be a post surgical stricture. Biopsies may help in this case. - Two 4 to 6 mm polyps in the rectum and in the descending colon, removed with a cold snare. Resected and retrieved. - Normal mucosa in the entire examined colon.  DIAGNOSIS:  A. COLON, STRICTURE ANASTOMOSIS; COLD BIOPSY:  - ENTERIC MUCOSA WITH ARCHITECTURAL DISTORTION AND ACUTE ULCER.  - NEGATIVE FOR DYSPLASIA AND MALIGNANCY.  - SEE COMMENT.   Comment:  There is focal active inflammation in the area of ulceration (one tissue  fragment). The remainder of the biopsies are consistent with chronic  quiescent colitis. Clinical correlation is recommended.   B. COLON, RIGHT; COLD BIOPSY:  - COLONIC MUCOSA WITH MILD  ARCHITECTURAL DISTORTION, CONSISTENT WITH THE  REPORTED HISTORY OF CROHN'S DISEASE.  - NEGATIVE FOR ACTIVE INFLAMMATION, DYSPLASIA, AND MALIGNANCY.   C. COLON POLYP, DESCENDING; COLD SNARE:  - TUBULAR ADENOMA.  - NEGATIVE FOR HIGH-GRADE DYSPLASIA AND MALIGNANCY.   D.  COLON, LEFT; COLD BIOPSY:  - COLONIC MUCOSA WITH MILD ARCHITECTURAL DISTORTION, CONSISTENT WITH THE  REPORTED HISTORY OF CROHN'S DISEASE.  - NEGATIVE FOR ACTIVE INFLAMMATION, DYSPLASIA, AND MALIGNANCY.   E. COLON POLYP, RECTOSIGMOID; COLD SNARE:  - HYPERPLASTIC POLYP.  - NEGATIVE FOR DYSPLASIA AND MALIGNANCY.   Colonoscopy 02/11/2019 - Patent end-to-side ileo-colonic anastomosis, characterized by mild stenosis. Dilated to 80mm. - The examined portion of the ileum was normal. Biopsied. - Normal mucosa in the entire examined colon. - The distal rectum and anal verge are normal on retroflexion view.  DIAGNOSIS:  A. NEO-TERMINAL ILEUM; COLD BIOPSY:  - ILEAL-TYPE MUCOSA WITH INTACT VILLI.  - NEGATIVE FOR ACTIVE INFLAMMATION, INTRAEPITHELIAL LYMPHOCYTOSIS,  INFECTIOUS AGENTS, AND GRANULOMAS.    Past Medical History:  Diagnosis Date  . Arthritis   . BPH (benign prostatic hyperplasia)   . Crohn's disease ([place])   . Hypertension   . Kidney stones     Past Surgical History:  Procedure Laterality Date  . CHOLECYSTECTOMY    . CHOLECYSTECTOMY, LAPAROSCOPIC N/A   . COLON SURGERY     Colectomy 2005  . COLONOSCOPY N/A 01/11/2019   Procedure: COLONOSCOPY;  Surgeon: Virgel Manifold, MD;  Location: [facility] ENDOSCOPY;  Service: Endoscopy;  Laterality: N/A;  . COLONOSCOPY WITH PROPOFOL N/A 02/11/2019   Procedure: COLONOSCOPY WITH PROPOFOL;  Surgeon: Lin Landsman, MD;  Location[facility] ENDOSCOPY;  Service: Gastroenterology;  Laterality: N/A;  Pediatric endoscope  . CYSTOSCOPY W/ RETROGRADES Bilateral 04/01/2017   Procedure: CYSTOSCOPY WITH RETROGRADE PYELOGRAM;  Surgeon: Hollice Espy, MD;  Location: [facility] ORS;  Service: Urology;  Laterality: Bilateral;  . CYSTOSCOPY WITH STENT PLACEMENT Bilateral 04/01/2017   Procedure: CYSTOSCOPY WITH STENT PLACEMENT;  Surgeon: Hollice Espy, MD;  Location: [facility] ORS;  Service: Urology;  Laterality: Bilateral;  . CYSTOSCOPY/URETEROSCOPY/HOLMIUM  LASER/STENT PLACEMENT Left 10/16/2014   Procedure: CYSTOSCOPY/URETEROSCOPY/HOLMIUM LASER/STENT PLACEMENT;  Surgeon: Hollice Espy, MD;  Location: [facility] ORS;  Service: Urology;  Laterality: Left;  . CYSTOSCOPY/URETEROSCOPY/HOLMIUM LASER/STENT PLACEMENT Bilateral 04/20/2017   Procedure: CYSTOSCOPY/URETEROSCOPY/HOLMIUM LASER/STENT EXCHANGE;  Surgeon: Hollice Espy, MD;  Location: [facility] ORS;  Service: Urology;  Laterality: Bilateral;  . HERNIA REPAIR    . Percutaneous Left    Nephrolithotomy    Current Outpatient Medications:  .  allopurinol (ZYLOPRIM) 300 MG tablet, Take 300 mg by mouth daily., Disp: , Rfl:  .  amLODipine (NORVASC) 2.5 MG tablet, Take 2.5 mg by mouth daily., Disp: , Rfl:  .  finasteride (PROSCAR) 5 MG tablet, Take 1 tablet (5 mg total) by mouth daily., Disp: 90 tablet, Rfl: 5 .  losartan (COZAAR) 100 MG tablet, TAKE 1 TABLET BY MOUTH EVERY DAY IN THE MORNING, Disp: , Rfl:  .  meloxicam (MOBIC) 15 MG tablet, Take 15 mg by mouth daily as needed. , Disp: , Rfl:  .  metoprolol (LOPRESSOR) 50 MG tablet, Take 50 mg by mouth 2 (two) times daily., Disp: , Rfl:  .  Multiple Vitamin (MULTIVITAMIN WITH MINERALS) TABS tablet, Take 1 tablet by mouth daily., Disp: , Rfl:  .  phentermine 37.5 MG capsule, Take 37.5 mg by mouth every morning., Disp: , Rfl:  .  traZODone (DESYREL) 150 MG tablet, Take 150 mg by mouth at bedtime., Disp: , Rfl:   History reviewed.  No pertinent family history.   Social History   Tobacco Use  . Smoking status: Never Smoker  . Smokeless tobacco: Never Used  Substance Use Topics  . Alcohol use: No  . Drug use: No    Allergies as of 02/28/2019  . (No Known Allergies)    Review of Systems:    All systems reviewed and negative except where noted in HPI.   Physical Exam:  BP (!) 152/81 (BP Location: Left Arm, Patient Position: Sitting, Cuff Size: Normal)   Pulse 72   Temp 97.9 F (36.6 C) (Oral)   Wt 263 lb 8 oz (119.5 kg)   BMI 42.53 kg/m  No LMP for  male patient.  General:   Alert,  Well-developed, well-nourished, pleasant and cooperative in NAD Head:  Normocephalic and atraumatic. Eyes:  Sclera clear, no icterus.   Conjunctiva pink. Ears:  Normal auditory acuity. Nose:  No deformity, discharge, or lesions. Mouth:  No deformity or lesions,oropharynx pink & moist. Neck:  Supple; no masses or thyromegaly. Lungs:  Respirations even and unlabored.  Clear throughout to auscultation.   No wheezes, crackles, or rhonchi. No acute distress. Heart:  Regular rate and rhythm; no murmurs, clicks, rubs, or gallops. Abdomen:  Normal bowel sounds. Soft, morbidly obese, ventral hernia, non-tender and mildly distended, tympanic to percussion, without masses, hepatosplenomegaly or hernias noted.  No guarding or rebound tenderness.   Rectal: Not performed Msk:  Symmetrical without gross deformities. Good, equal movement & strength bilaterally. Pulses:  Normal pulses noted. Extremities:  No clubbing or edema.  No cyanosis. Neurologic:  Alert and oriented x3;  grossly normal neurologically. Skin:  Intact without significant lesions or rashes. No jaundice. Psych:  Alert and cooperative. Normal mood and affect.  Imaging Studies: Reviewed  Assessment and Plan:   Isaiah Walker is a 73 y.o. male with morbid obesity, hypertension, history of small bowel Crohn's status post ileocolonic resection with primary anastomosis due to small bowel obstruction, recurrence of partial small bowel obstruction at the ileocolonic anastomosis in 12/2018 resolved with conservative management and prednisone, possible anastomotic stricture with ulceration status post endoscopic dilation of stricture in 01/2019 to15 mm.  There was no evidence of active Crohn's in the neoterminal ileum or in the proximal colon  There was no evidence of active Crohn's in the neoterminal ileum other than stricture at the ileocolonic anastomosis.  Given his age, at this time I am hesitant to start him on  immunomodulator or anti-TNF therapy I will monitor him closely at this time for any signs of bowel obstruction without maintenance therapy Encouraged him to avoid high roughage foods Recommend soluble fiber such as Benefiber, Citrucel, Metamucil as needed Avoid red meat  Follow up in 3 months   Cephas Darby, MD

## 2019-05-05 ENCOUNTER — Encounter: Payer: Self-pay | Admitting: Family Medicine

## 2019-05-07 IMAGING — US US RENAL
1 series · 14 of 25 positions shown · non-contrast
Comparison: CT 04/01/2017

CLINICAL DATA: Nephrolithiasis

EXAM:
RENAL / URINARY TRACT ULTRASOUND COMPLETE

[Series 1: us renal · 0.30mm/px · 14 of 49 slices shown]
[im 1/49]
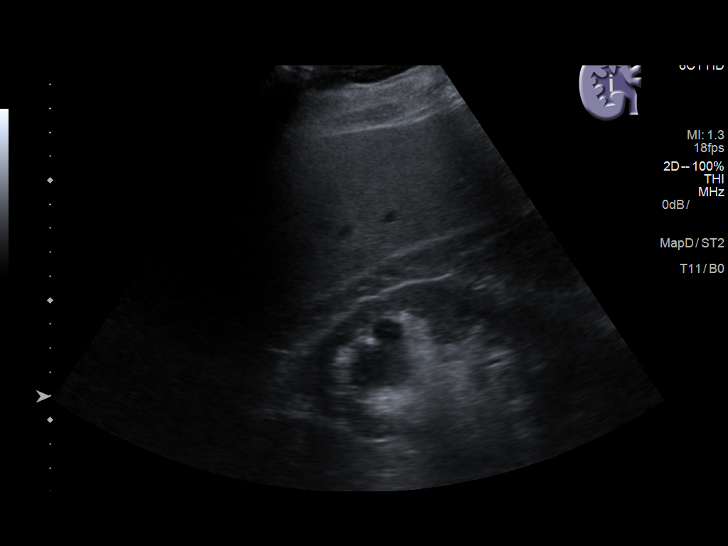
[im 5/49]
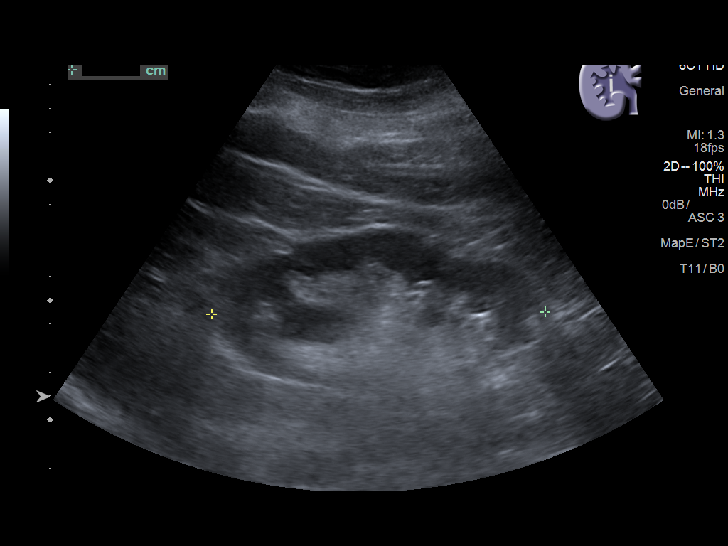
[im 9/49]
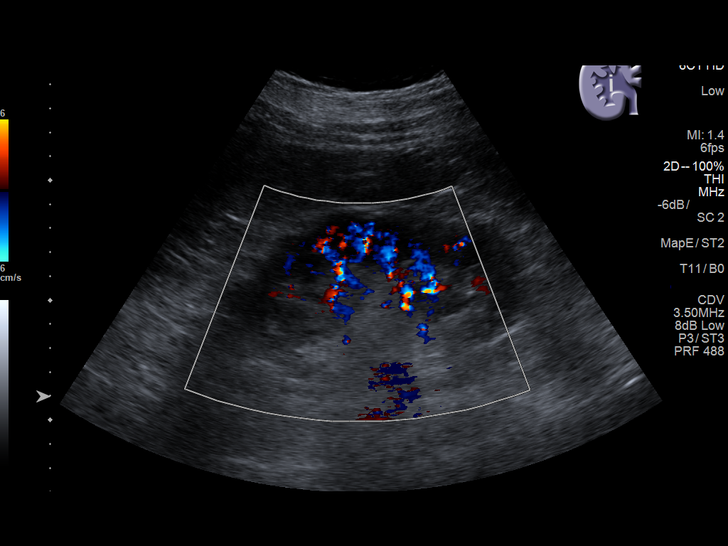
[im 13/49]
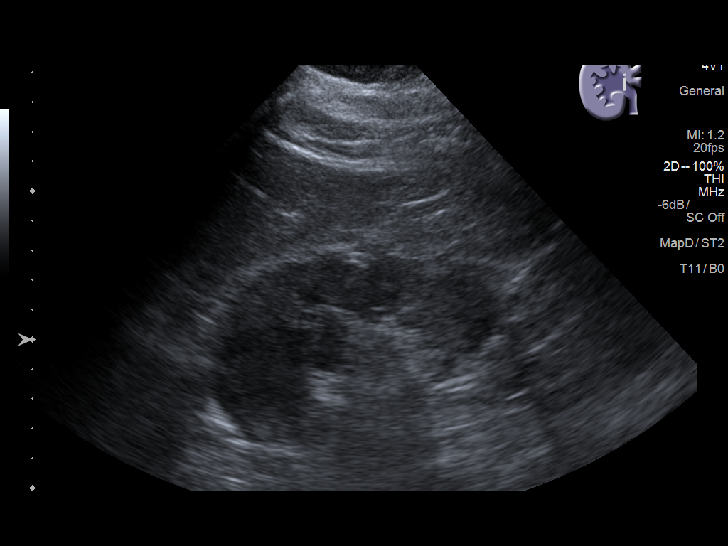
[im 17/49]
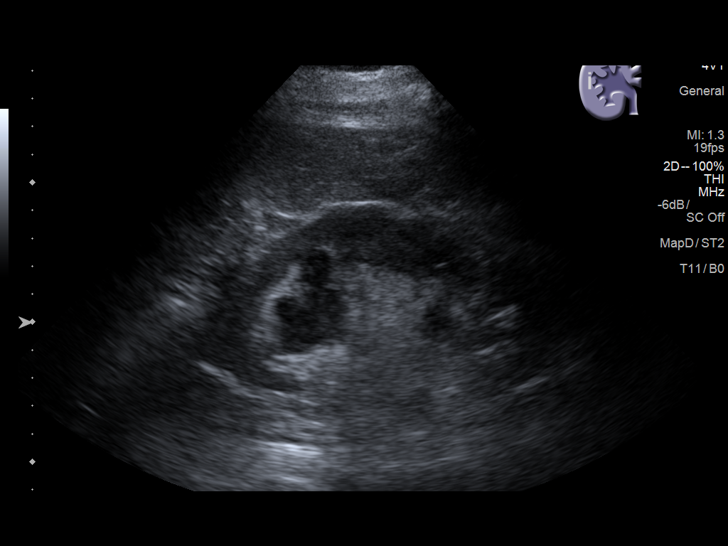
[im 19/49]
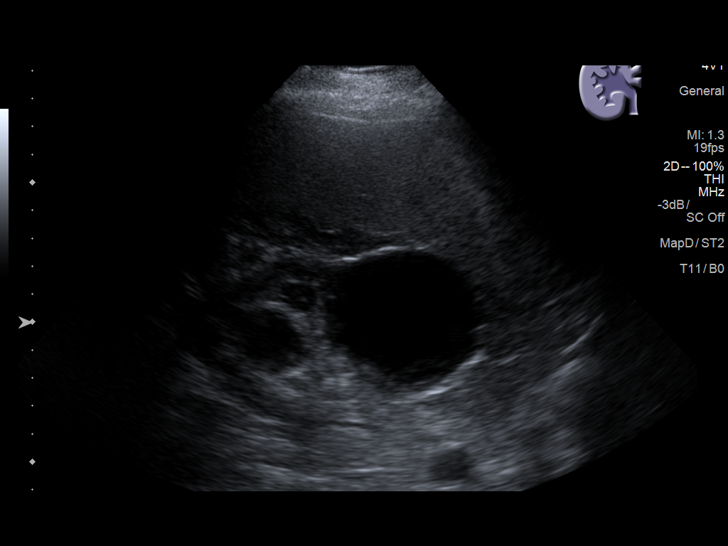
[im 23/49]
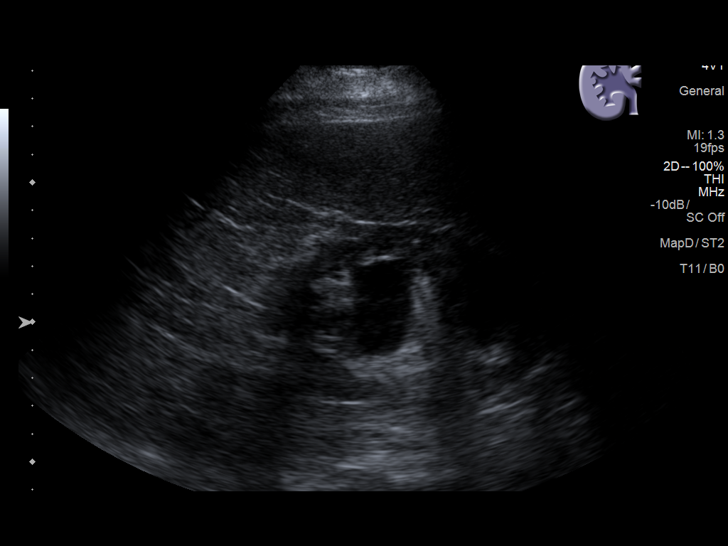
[im 27/49]
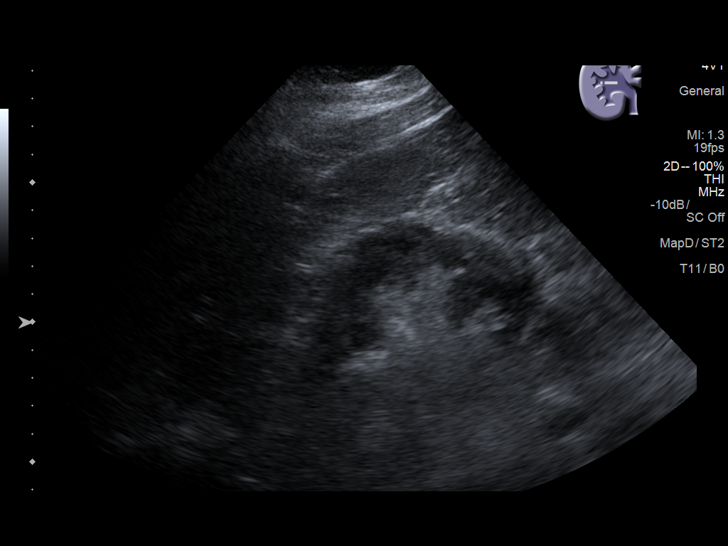
[im 31/49]
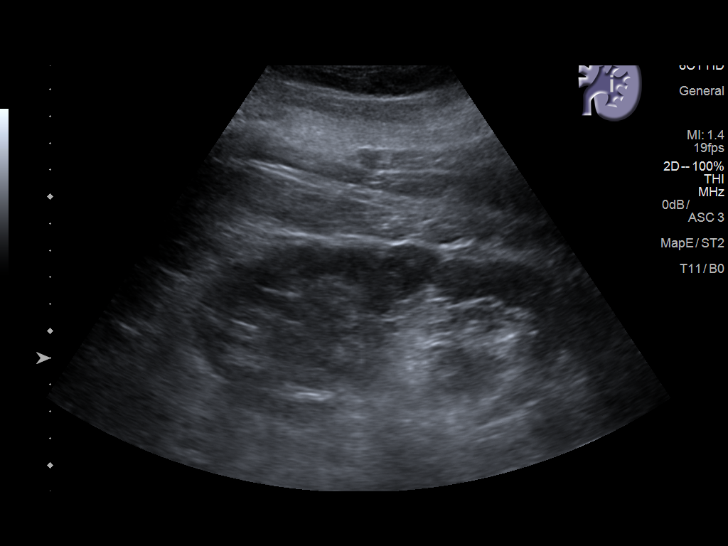
[im 33/49]
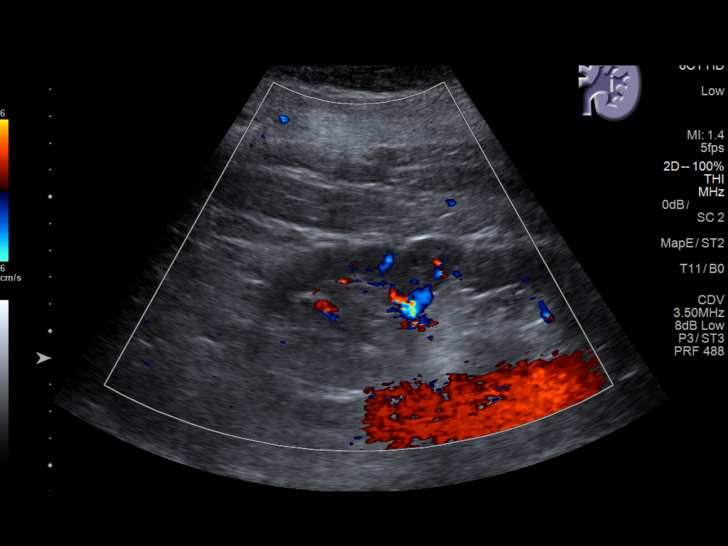
[im 37/49]
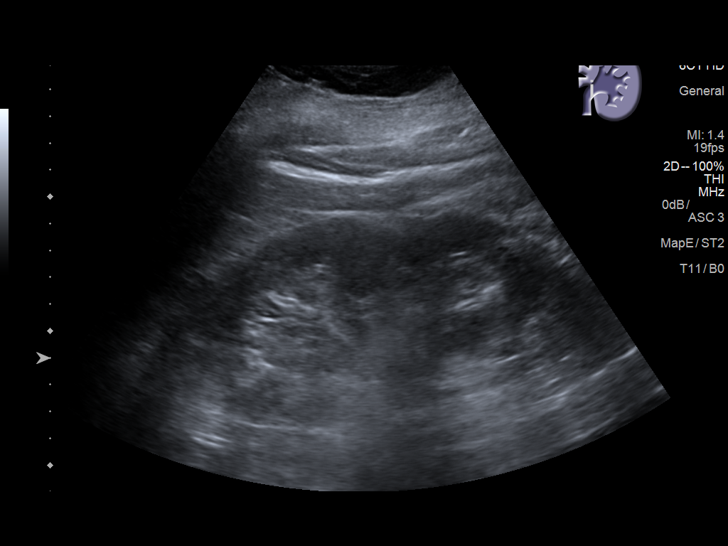
[im 41/49]
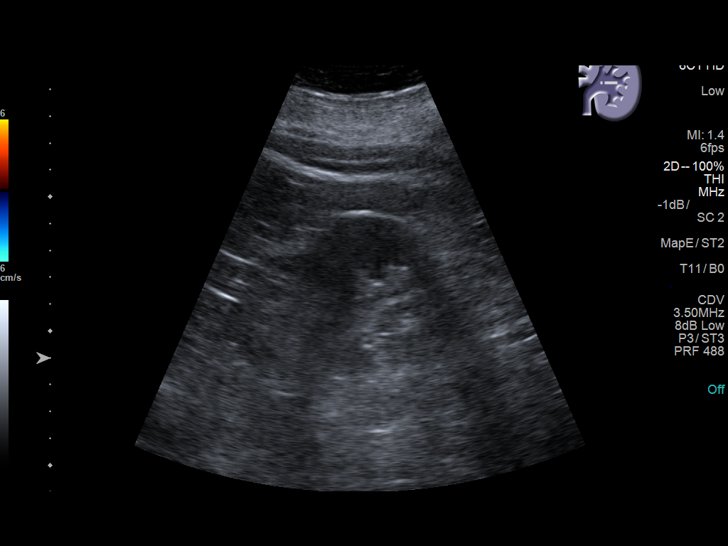
[im 45/49]
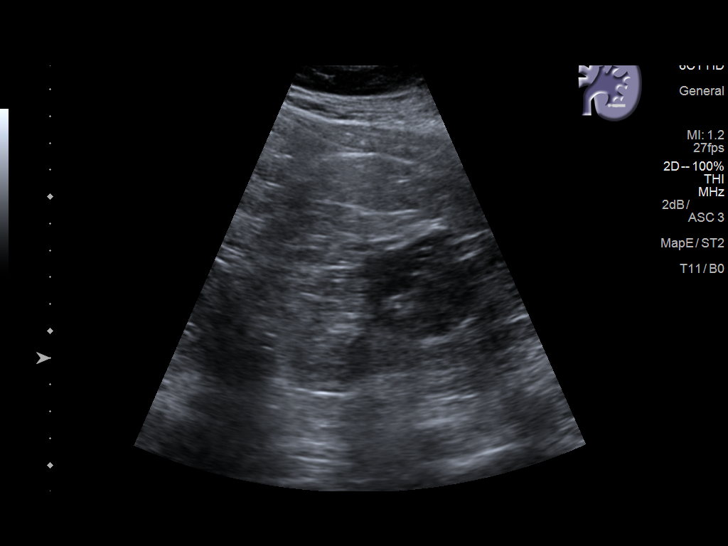
[im 49/49]
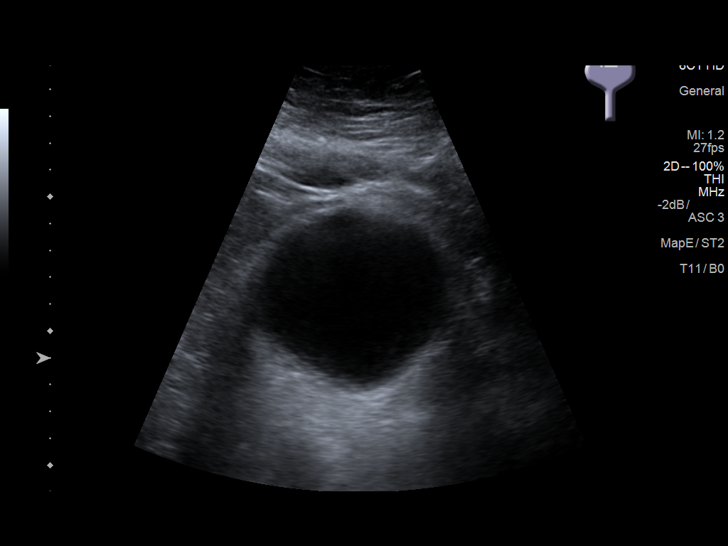

[14 of 25 positions shown; findings below may reference images not displayed]

FINDINGS: Right Kidney:

Length: 13.9 cm. Right hydronephrosis has improved. Simple cyst in
the upper pole is noted.

Left Kidney:

Length: 14.2 cm. Echogenicity within normal limits. No mass or
hydronephrosis visualized.

Bladder:

Decompressed.  Bilateral ureteral jets are identified.
IMPRESSION: Right hydronephrosis has improved.

Ureteral jets are identified compatible with ureteral patency
bilaterally.

## 2019-05-24 ENCOUNTER — Ambulatory Visit: Payer: Medicare HMO | Admitting: Gastroenterology

## 2019-07-31 ENCOUNTER — Ambulatory Visit: Payer: Medicare HMO | Attending: Internal Medicine

## 2019-07-31 DIAGNOSIS — Z23 Encounter for immunization: Secondary | ICD-10-CM | POA: Insufficient documentation

## 2019-07-31 NOTE — Progress Notes (Signed)
   Covid-19 Vaccination Clinic  Name:  Isaiah Walker    MRN: [id number] DOB: [birth year]  07/31/2019  Mr. Netro was observed post Covid-19 immunization for 15 minutes without incidence. He was provided with Vaccine Information Sheet and instruction to access the V-Safe system.   Mr. Little was instructed to call 911 with any severe reactions post vaccine: Marland Kitchen Difficulty breathing  . Swelling of your face and throat  . A fast heartbeat  . A bad rash all over your body  . Dizziness and weakness    Immunizations Administered    Name Date Dose VIS Date Route   Pfizer COVID-19 Vaccine 07/31/2019  9:49 AM 0.3 mL 05/27/2019 Intramuscular   Manufacturer: [place]   Lot: [id number]   [place]: [id number]

## 2019-08-02 ENCOUNTER — Ambulatory Visit: Payer: Medicare HMO | Admitting: Gastroenterology

## 2019-08-08 ENCOUNTER — Ambulatory Visit: Payer: Medicare HMO | Admitting: Urology

## 2019-08-10 ENCOUNTER — Ambulatory Visit: Payer: Self-pay | Admitting: Urology

## 2019-08-23 ENCOUNTER — Ambulatory Visit: Payer: Medicare HMO | Attending: Internal Medicine

## 2019-08-23 DIAGNOSIS — Z23 Encounter for immunization: Secondary | ICD-10-CM | POA: Insufficient documentation

## 2019-08-23 NOTE — Progress Notes (Signed)
   Covid-19 Vaccination Clinic  Name:  DEBRON FRITCH    MRN: [id number] DOB: 07-07-1945  08/23/2019  Mr. Friebel was observed post Covid-19 immunization for 15 minutes without incident. He was provided with Vaccine Information Sheet and instruction to access the V-Safe system.   Mr. Mitzner was instructed to call 911 with any severe reactions post vaccine: Marland Kitchen Difficulty breathing  . Swelling of face and throat  . A fast heartbeat  . A bad rash all over body  . Dizziness and weakness   Immunizations Administered    Name Date Dose VIS Date Route   Pfizer COVID-19 Vaccine 08/23/2019  9:28 AM 0.3 mL 05/27/2019 Intramuscular   Manufacturer: [place]   Lot: [id number]   [place]: [id number]

## 2020-07-05 DIAGNOSIS — E782 Mixed hyperlipidemia: Secondary | ICD-10-CM | POA: Diagnosis not present

## 2020-07-05 DIAGNOSIS — I10 Essential (primary) hypertension: Secondary | ICD-10-CM | POA: Diagnosis not present

## 2020-07-05 DIAGNOSIS — R7301 Impaired fasting glucose: Secondary | ICD-10-CM | POA: Diagnosis not present

## 2020-07-05 DIAGNOSIS — R5383 Other fatigue: Secondary | ICD-10-CM | POA: Diagnosis not present

## 2020-07-05 DIAGNOSIS — M109 Gout, unspecified: Secondary | ICD-10-CM | POA: Diagnosis not present

## 2020-07-05 DIAGNOSIS — E669 Obesity, unspecified: Secondary | ICD-10-CM | POA: Diagnosis not present

## 2020-07-05 DIAGNOSIS — N400 Enlarged prostate without lower urinary tract symptoms: Secondary | ICD-10-CM | POA: Diagnosis not present

## 2020-07-05 DIAGNOSIS — A499 Bacterial infection, unspecified: Secondary | ICD-10-CM | POA: Diagnosis not present

## 2020-07-06 DIAGNOSIS — I10 Essential (primary) hypertension: Secondary | ICD-10-CM | POA: Diagnosis not present

## 2020-07-06 DIAGNOSIS — N400 Enlarged prostate without lower urinary tract symptoms: Secondary | ICD-10-CM | POA: Diagnosis not present

## 2020-07-06 DIAGNOSIS — R7301 Impaired fasting glucose: Secondary | ICD-10-CM | POA: Diagnosis not present

## 2020-07-06 DIAGNOSIS — R5383 Other fatigue: Secondary | ICD-10-CM | POA: Diagnosis not present

## 2020-07-06 DIAGNOSIS — E782 Mixed hyperlipidemia: Secondary | ICD-10-CM | POA: Diagnosis not present

## 2020-07-19 DIAGNOSIS — N3281 Overactive bladder: Secondary | ICD-10-CM | POA: Diagnosis not present

## 2020-07-19 DIAGNOSIS — E785 Hyperlipidemia, unspecified: Secondary | ICD-10-CM | POA: Diagnosis not present

## 2020-07-19 DIAGNOSIS — M109 Gout, unspecified: Secondary | ICD-10-CM | POA: Diagnosis not present

## 2020-07-19 DIAGNOSIS — I10 Essential (primary) hypertension: Secondary | ICD-10-CM | POA: Diagnosis not present

## 2020-07-19 DIAGNOSIS — N400 Enlarged prostate without lower urinary tract symptoms: Secondary | ICD-10-CM | POA: Diagnosis not present

## 2020-07-19 DIAGNOSIS — R5383 Other fatigue: Secondary | ICD-10-CM | POA: Diagnosis not present

## 2020-07-19 DIAGNOSIS — E782 Mixed hyperlipidemia: Secondary | ICD-10-CM | POA: Diagnosis not present

## 2020-07-19 DIAGNOSIS — R7301 Impaired fasting glucose: Secondary | ICD-10-CM | POA: Diagnosis not present

## 2020-09-13 ENCOUNTER — Other Ambulatory Visit: Payer: Self-pay

## 2020-09-13 ENCOUNTER — Inpatient Hospital Stay
Admission: EM | Admit: 2020-09-13 | Discharge: 2020-09-18 | DRG: 389 | Disposition: A | Discharge status: Home / Self Care | Payer: Medicare HMO | Attending: Internal Medicine | Admitting: Internal Medicine

## 2020-09-13 ENCOUNTER — Emergency Department: Payer: Medicare HMO

## 2020-09-13 DIAGNOSIS — K56609 Unspecified intestinal obstruction, unspecified as to partial versus complete obstruction: Secondary | ICD-10-CM

## 2020-09-13 DIAGNOSIS — N179 Acute kidney failure, unspecified: Secondary | ICD-10-CM | POA: Diagnosis not present

## 2020-09-13 DIAGNOSIS — Z20822 Contact with and (suspected) exposure to covid-19: Secondary | ICD-10-CM | POA: Diagnosis present

## 2020-09-13 DIAGNOSIS — M109 Gout, unspecified: Secondary | ICD-10-CM | POA: Diagnosis present

## 2020-09-13 DIAGNOSIS — K760 Fatty (change of) liver, not elsewhere classified: Secondary | ICD-10-CM | POA: Diagnosis not present

## 2020-09-13 DIAGNOSIS — N401 Enlarged prostate with lower urinary tract symptoms: Secondary | ICD-10-CM

## 2020-09-13 DIAGNOSIS — K5090 Crohn's disease, unspecified, without complications: Secondary | ICD-10-CM

## 2020-09-13 DIAGNOSIS — Z79899 Other long term (current) drug therapy: Secondary | ICD-10-CM

## 2020-09-13 DIAGNOSIS — K5669 Other partial intestinal obstruction: Principal | ICD-10-CM | POA: Diagnosis present

## 2020-09-13 DIAGNOSIS — K56600 Partial intestinal obstruction, unspecified as to cause: Secondary | ICD-10-CM

## 2020-09-13 DIAGNOSIS — Z6841 Body Mass Index (BMI) 40.0 and over, adult: Secondary | ICD-10-CM

## 2020-09-13 DIAGNOSIS — Z8719 Personal history of other diseases of the digestive system: Secondary | ICD-10-CM

## 2020-09-13 DIAGNOSIS — R000 Tachycardia, unspecified: Secondary | ICD-10-CM | POA: Diagnosis not present

## 2020-09-13 DIAGNOSIS — I10 Essential (primary) hypertension: Secondary | ICD-10-CM | POA: Diagnosis present

## 2020-09-13 DIAGNOSIS — Z9049 Acquired absence of other specified parts of digestive tract: Secondary | ICD-10-CM

## 2020-09-13 DIAGNOSIS — Z87442 Personal history of urinary calculi: Secondary | ICD-10-CM

## 2020-09-13 DIAGNOSIS — N400 Enlarged prostate without lower urinary tract symptoms: Secondary | ICD-10-CM | POA: Diagnosis present

## 2020-09-13 DIAGNOSIS — R197 Diarrhea, unspecified: Secondary | ICD-10-CM | POA: Diagnosis present

## 2020-09-13 HISTORY — DX: Partial intestinal obstruction, unspecified as to cause: K56.600

## 2020-09-13 LAB — COMPREHENSIVE METABOLIC PANEL
ALT: 42 U/L (ref 0–44)
AST: 26 U/L (ref 15–41)
Albumin: 4.5 g/dL (ref 3.5–5.0)
Alkaline Phosphatase: 37 U/L — ABNORMAL LOW (ref 38–126)
Anion gap: 12 (ref 5–15)
BUN: 22 mg/dL (ref 8–23)
CO2: 23 mmol/L (ref 22–32)
Calcium: 9.9 mg/dL (ref 8.9–10.3)
Chloride: 101 mmol/L (ref 98–111)
Creatinine, Ser: 1.15 mg/dL (ref 0.61–1.24)
GFR, Estimated: >60 mL/min (ref >60)
Glucose, Bld: 131 mg/dL — ABNORMAL HIGH (ref 70–99)
Potassium: 4.6 mmol/L (ref 3.5–5.1)
Sodium: 136 mmol/L (ref 135–145)
Total Bilirubin: 1.4 mg/dL — ABNORMAL HIGH (ref 0.3–1.2)
Total Protein: 7.7 g/dL (ref 6.5–8.1)

## 2020-09-13 LAB — CBC WITH DIFFERENTIAL/PLATELET
Abs Immature Granulocytes: 0.16 10*3/uL — ABNORMAL HIGH (ref 0.00–0.07)
Basophils Absolute: 0.1 10*3/uL (ref 0.0–0.1)
Basophils Relative: 0 %
Eosinophils Absolute: 0.1 10*3/uL (ref 0.0–0.5)
Eosinophils Relative: 1 %
HCT: 47.5 % (ref 39.0–52.0)
Hemoglobin: 16.2 g/dL (ref 13.0–17.0)
Immature Granulocytes: 1 %
Lymphocytes Relative: 6 %
Lymphs Abs: 0.8 10*3/uL (ref 0.7–4.0)
MCH: 29.7 pg (ref 26.0–34.0)
MCHC: 34.1 g/dL (ref 30.0–36.0)
MCV: 87 fL (ref 80.0–100.0)
Monocytes Absolute: 1.1 10*3/uL — ABNORMAL HIGH (ref 0.1–1.0)
Monocytes Relative: 7 %
Neutro Abs: 12.9 10*3/uL — ABNORMAL HIGH (ref 1.7–7.7)
Neutrophils Relative %: 85 %
Platelets: 181 10*3/uL (ref 150–400)
RBC: 5.46 MIL/uL (ref 4.22–5.81)
RDW: 13.2 % (ref 11.5–15.5)
WBC: 15 10*3/uL — ABNORMAL HIGH (ref 4.0–10.5)
nRBC: 0 % (ref 0.0–0.2)

## 2020-09-13 LAB — URINALYSIS, COMPLETE (UACMP) WITH MICROSCOPIC
Bacteria, UA: NONE SEEN
Bilirubin Urine: NEGATIVE
Glucose, UA: NEGATIVE mg/dL
Hgb urine dipstick: NEGATIVE
Ketones, ur: 5 mg/dL — AB
Nitrite: NEGATIVE
Protein, ur: NEGATIVE mg/dL
Specific Gravity, Urine: 1.019 (ref 1.005–1.030)
pH: 5 (ref 5.0–8.0)

## 2020-09-13 LAB — LIPASE, BLOOD: Lipase: 24 U/L (ref 11–51)

## 2020-09-13 LAB — TROPONIN I (HIGH SENSITIVITY)
Troponin I (High Sensitivity): 5 ng/L (ref <18)
Troponin I (High Sensitivity): 5 ng/L (ref <18)

## 2020-09-13 MED ORDER — ENOXAPARIN SODIUM 80 MG/0.8ML ~~LOC~~ SOLN
0.5000 mg/kg | SUBCUTANEOUS | Status: DC
  Administered 2020-09-13 – 2020-09-17 (×5): 65 mg via SUBCUTANEOUS
  Filled 2020-09-13 (×6): qty 0.8

## 2020-09-13 MED ORDER — IOHEXOL 300 MG/ML  SOLN
125.0000 mL | Freq: Once | INTRAMUSCULAR | Status: AC | PRN
  Administered 2020-09-13: 125 mL via INTRAVENOUS

## 2020-09-13 MED ORDER — ACETAMINOPHEN 650 MG RE SUPP: 650.0000 mg | Freq: Four times a day (QID) | RECTAL | Status: DC | PRN

## 2020-09-13 MED ORDER — LACTATED RINGERS IV SOLN
INTRAVENOUS | Status: DC
  Administered 2020-09-13: 1000 mL via INTRAVENOUS

## 2020-09-13 MED ORDER — OXYBUTYNIN CHLORIDE 5 MG PO TABS
5.0000 mg | ORAL_TABLET | Freq: Every morning | ORAL | Status: DC
  Administered 2020-09-14 – 2020-09-18 (×5): 5 mg via ORAL
  Filled 2020-09-13 (×5): qty 1

## 2020-09-13 MED ORDER — MORPHINE SULFATE (PF) 2 MG/ML IV SOLN
2.0000 mg | INTRAVENOUS | Status: DC | PRN
Start: 2020-09-13 — End: 2020-09-18
  Administered 2020-09-13 – 2020-09-15 (×5): 2 mg via INTRAVENOUS
  Filled 2020-09-13 (×5): qty 1

## 2020-09-13 MED ORDER — ACETAMINOPHEN 325 MG PO TABS: 650.0000 mg | ORAL_TABLET | Freq: Four times a day (QID) | ORAL | Status: DC | PRN

## 2020-09-13 MED ORDER — HYDROMORPHONE HCL 1 MG/ML IJ SOLN
0.5000 mg | Freq: Once | INTRAMUSCULAR | Status: AC
  Administered 2020-09-13: 0.5 mg via INTRAVENOUS
  Filled 2020-09-13: qty 1

## 2020-09-13 MED ORDER — METOPROLOL TARTRATE 50 MG PO TABS
50.0000 mg | ORAL_TABLET | Freq: Two times a day (BID) | ORAL | Status: DC
  Administered 2020-09-13 – 2020-09-18 (×10): 50 mg via ORAL
  Filled 2020-09-13 (×10): qty 1

## 2020-09-13 MED ORDER — AMLODIPINE BESYLATE 5 MG PO TABS
2.5000 mg | ORAL_TABLET | Freq: Every day | ORAL | Status: DC
  Administered 2020-09-13 – 2020-09-18 (×6): 2.5 mg via ORAL
  Filled 2020-09-13 (×6): qty 1

## 2020-09-13 MED ORDER — ONDANSETRON HCL 4 MG/2ML IJ SOLN
4.0000 mg | Freq: Once | INTRAMUSCULAR | Status: AC
  Administered 2020-09-13: 4 mg via INTRAVENOUS
  Filled 2020-09-13: qty 2

## 2020-09-13 MED ORDER — LABETALOL HCL 5 MG/ML IV SOLN: 10.0000 mg | Freq: Four times a day (QID) | INTRAVENOUS | Status: DC | PRN

## 2020-09-13 MED ORDER — LOSARTAN POTASSIUM 50 MG PO TABS
100.0000 mg | ORAL_TABLET | Freq: Every day | ORAL | Status: DC
  Administered 2020-09-14 – 2020-09-18 (×5): 100 mg via ORAL
  Filled 2020-09-13 (×5): qty 2

## 2020-09-13 MED ORDER — ONDANSETRON HCL 4 MG/2ML IJ SOLN
4.0000 mg | Freq: Four times a day (QID) | INTRAMUSCULAR | Status: DC | PRN
  Administered 2020-09-13: 4 mg via INTRAVENOUS
  Filled 2020-09-13: qty 2

## 2020-09-13 MED ORDER — TRAZODONE HCL 50 MG PO TABS
150.0000 mg | ORAL_TABLET | Freq: Every day | ORAL | Status: DC
  Administered 2020-09-13 – 2020-09-17 (×3): 150 mg via ORAL
  Filled 2020-09-13 (×5): qty 1

## 2020-09-13 MED ORDER — FINASTERIDE 5 MG PO TABS
5.0000 mg | ORAL_TABLET | Freq: Every day | ORAL | Status: DC
  Administered 2020-09-14 – 2020-09-18 (×5): 5 mg via ORAL
  Filled 2020-09-13 (×5): qty 1

## 2020-09-13 MED ORDER — ALLOPURINOL 100 MG PO TABS
300.0000 mg | ORAL_TABLET | Freq: Every day | ORAL | Status: DC
Start: 2020-09-14 — End: 2020-09-18
  Administered 2020-09-14 – 2020-09-18 (×5): 300 mg via ORAL
  Filled 2020-09-13 (×5): qty 3

## 2020-09-13 NOTE — ED Triage Notes (Signed)
Pt woke up at 5 am this morning with pain around his umbilicus. Pt states he has a hx of crohn's disease, and also thinks he has a hernia on top of his umbilicus. Pt with golf ball sized lump above belly button area. Pt denies diarrhea but does have some nausea. Pt states pain radiates to back.

## 2020-09-13 NOTE — ED Provider Notes (Signed)
[facility] Emergency Department Provider Note  ____________________________________________   Event Date/Time   First MD Initiated Contact with Patient 09/13/20 1630     (approximate)  I have reviewed the triage vital signs and the nursing notes.   HISTORY  Chief Complaint Abdominal Pain   HPI Isaiah Walker is a 75 y.o. male with a past medical history of arthritis, BPH, Crohn's disease s/p ileocecal resection with primary anastomosis (hospitalized 2020 for ulceration at the site of anastomosis managed conservatively), HTN, kidney stones and gout who presents for assessment of some generalized abdominal pain rating around to both flanks started this morning around 5 AM and woke him from sleep.  States he went to bed feeling normal last night.  No prior similar episodes or clear alleviating aggravating factors.  States it does not feel like his usual kidney stone or Crohn's pain.  He states he feels nauseous but has not vomited.  He denies any diarrhea or constipation or urinary symptoms.  No recent injuries falls or heavy lifting.  Denies any headache, earache, sore throat, fevers, chills, cough, chest pain, shortness of breath, rash or extremity pain.  Denies any significant EtOH use but does endorse taking meloxicam most days.  No other acute concerns at this time         Past Medical History:  Diagnosis Date  . Arthritis   . BPH (benign prostatic hyperplasia)   . Crohn's disease ([place])   . Hypertension   . Kidney stones     Patient Active Problem List   Diagnosis Date Noted  . HTN (hypertension) 09/13/2020  . Partial small bowel obstruction ([place]) 09/13/2020  . Crohn's disease ([place]) 09/13/2020  . BPH (benign prostatic hyperplasia) 09/13/2020  . Obesity, Class III, BMI 40-49.9 (morbid obesity) ([place]) 09/13/2020  . Small bowel stricture ([facility])   . Acute abdominal pain 01/09/2019  . Acute cystitis with hematuria   . Acute renal insufficiency   . Kidney  stone 04/01/2017    Past Surgical History:  Procedure Laterality Date  . CHOLECYSTECTOMY    . CHOLECYSTECTOMY, LAPAROSCOPIC N/A   . COLON SURGERY     Colectomy 2005  . COLONOSCOPY N/A 01/11/2019   Procedure: COLONOSCOPY;  Surgeon: Virgel Manifold, MD;  Location: [facility] ENDOSCOPY;  Service: Endoscopy;  Laterality: N/A;  . COLONOSCOPY WITH PROPOFOL N/A 02/11/2019   Procedure: COLONOSCOPY WITH PROPOFOL;  Surgeon: Lin Landsman, MD;  Location: [facility] ENDOSCOPY;  Service: Gastroenterology;  Laterality: N/A;  Pediatric endoscope  . CYSTOSCOPY W/ RETROGRADES Bilateral 04/01/2017   Procedure: CYSTOSCOPY WITH RETROGRADE PYELOGRAM;  Surgeon: Hollice Espy, MD;  Location: [facility] ORS;  Service: Urology;  Laterality: Bilateral;  . CYSTOSCOPY WITH STENT PLACEMENT Bilateral 04/01/2017   Procedure: CYSTOSCOPY WITH STENT PLACEMENT;  Surgeon: Hollice Espy, MD;  Location: [facility] ORS;  Service: Urology;  Laterality: Bilateral;  . CYSTOSCOPY/URETEROSCOPY/HOLMIUM LASER/STENT PLACEMENT Left 10/16/2014   Procedure: CYSTOSCOPY/URETEROSCOPY/HOLMIUM LASER/STENT PLACEMENT;  Surgeon: Hollice Espy, MD;  Location: [facility] ORS;  Service: Urology;  Laterality: Left;  . CYSTOSCOPY/URETEROSCOPY/HOLMIUM LASER/STENT PLACEMENT Bilateral 04/20/2017   Procedure: CYSTOSCOPY/URETEROSCOPY/HOLMIUM LASER/STENT EXCHANGE;  Surgeon: Hollice Espy, MD;  Location: [facility] ORS;  Service: Urology;  Laterality: Bilateral;  . HERNIA REPAIR    . Percutaneous Left    Nephrolithotomy    Prior to Admission medications   Medication Sig Start Date End Date Taking? Authorizing Provider  allopurinol (ZYLOPRIM) 300 MG tablet Take 300 mg by mouth daily.   Yes [provider]  amLODipine (NORVASC) 2.5 MG tablet  Take 2.5 mg by mouth daily.   Yes [provider]  finasteride (PROSCAR) 5 MG tablet Take 1 tablet (5 mg total) by mouth daily. 04/20/15  Yes Nickie Retort, MD  losartan (COZAAR) 100 MG tablet TAKE 1 TABLET BY MOUTH  EVERY DAY IN THE MORNING 07/19/18  Yes [provider]  meloxicam (MOBIC) 15 MG tablet Take 15 mg by mouth daily as needed.  07/25/18  Yes [provider]  metoprolol (LOPRESSOR) 50 MG tablet Take 50 mg by mouth 2 (two) times daily.   Yes [provider]  oxybutynin (DITROPAN) 5 MG tablet Take 5 mg by mouth every morning. 08/11/20  Yes [provider]  traZODone (DESYREL) 150 MG tablet Take 150 mg by mouth at bedtime.   Yes [provider]    Allergies Patient has no known allergies.  History reviewed. No pertinent family history.  Social History Social History   Tobacco Use  . Smoking status: Never Smoker  . Smokeless tobacco: Never Used  Vaping Use  . Vaping Use: Never used  Substance Use Topics  . Alcohol use: No  . Drug use: No    Review of Systems  Review of Systems  Constitutional: Negative for chills and fever.  HENT: Negative for sore throat.   Eyes: Negative for pain.  Respiratory: Negative for cough and stridor.   Cardiovascular: Negative for chest pain.  Gastrointestinal: Positive for abdominal pain and nausea. Negative for vomiting.  Genitourinary: Negative for dysuria.  Musculoskeletal: Positive for back pain. Negative for myalgias.  Skin: Negative for rash.  Neurological: Negative for seizures, loss of consciousness and headaches.  Psychiatric/Behavioral: Negative for suicidal ideas.  All other systems reviewed and are negative.     ____________________________________________   PHYSICAL EXAM:  VITAL SIGNS: ED Triage Vitals  Enc Vitals Group     BP 09/13/20 1627 (!) 187/101     Pulse Rate 09/13/20 1627 90     Resp 09/13/20 1627 20     Temp 09/13/20 1627 98.5 F (36.9 C)     Temp Source 09/13/20 1627 Oral     SpO2 09/13/20 1627 94 %     Weight 09/13/20 1630 285 lb (129.3 kg)     Height 09/13/20 1630 5\' 6"  (1.676 m)     Head Circumference --      Peak Flow --      Pain Score 09/13/20 1629 4     Pain Loc  --      Pain Edu? --      Excl. in [place]? --    Vitals:   09/13/20 1728 09/13/20 1807  BP:  (!) 175/76  Pulse: 96 (!) 108  Resp: (!) 22 (!) 35  Temp:    SpO2: 96% 91%   Physical Exam Vitals and nursing note reviewed.  Constitutional:      Appearance: He is well-developed. He is obese. He is ill-appearing.  HENT:     Head: Normocephalic and atraumatic.     Right Ear: External ear normal.     Left Ear: External ear normal.     Nose: Nose normal.  Eyes:     Conjunctiva/sclera: Conjunctivae normal.  Cardiovascular:     Rate and Rhythm: Normal rate and regular rhythm.     Heart sounds: No murmur heard.   Pulmonary:     Effort: Pulmonary effort is normal. No respiratory distress.     Breath sounds: Normal breath sounds.  Abdominal:     Palpations: Abdomen is  soft.     Tenderness: There is generalized abdominal tenderness. There is no right CVA tenderness or left CVA tenderness.     Hernia: There is no hernia in the left inguinal area or right inguinal area.  Musculoskeletal:     Cervical back: Neck supple.  Skin:    General: Skin is warm and dry.     Capillary Refill: Capillary refill takes less than 2 seconds.  Neurological:     General: No focal deficit present.     Mental Status: He is alert.  Psychiatric:        Mood and Affect: Mood normal.     Scrotal exam is unremarkable.  No CVA tenderness. ____________________________________________   LABS (all labs ordered are listed, but only abnormal results are displayed)  Labs Reviewed  CBC WITH DIFFERENTIAL/PLATELET - Abnormal; Notable for the following components:      Result Value   WBC 15.0 (*)    Neutro Abs 12.9 (*)    Monocytes Absolute 1.1 (*)    Abs Immature Granulocytes 0.16 (*)    All other components within normal limits  COMPREHENSIVE METABOLIC PANEL - Abnormal; Notable for the following components:   Glucose, Bld 131 (*)    Alkaline Phosphatase 37 (*)    Total Bilirubin 1.4 (*)    All other  components within normal limits  URINALYSIS, COMPLETE (UACMP) WITH MICROSCOPIC - Abnormal; Notable for the following components:   Color, Urine YELLOW (*)    APPearance CLEAR (*)    Ketones, ur 5 (*)    Leukocytes,Ua TRACE (*)    All other components within normal limits  SARS CORONAVIRUS 2 (TAT 6-24 HRS)  LIPASE, BLOOD  TROPONIN I (HIGH SENSITIVITY)  TROPONIN I (HIGH SENSITIVITY)   ____________________________________________  EKG  Sinus rhythm with ventricular rate of 83, normal axis, unremarkable intervals and no clear evidence of acute ischemia or other significant underlying arrhythmia. ____________________________________________  RADIOLOGY  ED MD interpretation: Possible partial SBO around anastomotic site.  There is also small fat-containing supraumbilical ventral hernia  Official radiology report(s): CT ABDOMEN PELVIS W CONTRAST  Result Date: 09/13/2020 CLINICAL DATA:  Periumbilical pain, history of Crohn disease, palpable umbilical mass EXAM: CT ABDOMEN AND PELVIS WITH CONTRAST TECHNIQUE: Multidetector CT imaging of the abdomen and pelvis was performed using the standard protocol following bolus administration of intravenous contrast. CONTRAST:  170mL OMNIPAQUE IOHEXOL 300 MG/ML  SOLN COMPARISON:  01/24/2019 FINDINGS: Lower chest: No acute pleural or parenchymal lung disease. Hepatobiliary: Diffuse hepatic steatosis. No focal liver abnormality. Gallbladder surgically absent. Pancreas: Unremarkable. No pancreatic ductal dilatation or surrounding inflammatory changes. Spleen: Normal in size without focal abnormality. Adrenals/Urinary Tract: Stable bilateral renal cortical atrophy and bilateral renal cortical cysts. Nonobstructing calculi within the lower pole left kidney measure up to 2.4 cm in size. No obstructive uropathy within either kidney. The bladder is minimally distended without focal abnormality. The adrenals are normal. Stomach/Bowel: There is mild dilation of the mid to  distal small bowel measuring up to 3.2 cm in diameter. There is no abrupt transition point, with the distal small bowel distension extending to the ileocolic anastomosis in the right lower quadrant. Favor early obstruction. There is no bowel wall thickening or inflammatory change. Vascular/Lymphatic: Aortic atherosclerosis. No enlarged abdominal or pelvic lymph nodes. Reproductive: Prostate is unremarkable. Other: No free fluid or free gas. Fat containing supraumbilical ventral hernia, with abdominal wall defect measuring up to 1.2 cm. No bowel herniation. Musculoskeletal: No acute or destructive bony lesions. Reconstructed images demonstrate  no additional findings. IMPRESSION: 1. Mild dilation of the mid to distal small bowel, to the level of the ileocolic anastomosis. Findings favor early or intermittent small bowel obstruction. 2. Diffuse hepatic steatosis. 3. Stable nonobstructing left renal calculi. 4. Fat containing supraumbilical ventral hernia, corresponding to the patient's palpable abnormality. 5.  Aortic Atherosclerosis (ICD10-I70.0). Electronically Signed   By: Randa Ngo M.D.   On: 09/13/2020 18:35    ____________________________________________   PROCEDURES  Procedure(s) performed (including Critical Care):  .1-3 Lead EKG Interpretation Performed by: Lucrezia Starch, MD Authorized by: Lucrezia Starch, MD     Interpretation: abnormal     ECG rate assessment: tachycardic     Rhythm: sinus tachycardia     Ectopy: none     Conduction: normal       ____________________________________________   INITIAL IMPRESSION / ASSESSMENT AND PLAN / ED COURSE      Patient presents with above-stated exam for assessment of acute onset of generalized abdominal pain radiating around both flanks associate with some nausea.  On arrival he is hypertensive with otherwise stable vital signs on room air.  He is mildly tender throughout his abdomen which is a little bit distended.  No  significant CVA tenderness.  GU exam is unremarkable.  Differential includes but not limited to atypical presentation for ACS, AAA, pancreatitis, cholecystitis, kidney stone, Crohn's flare, diverticulitis, appendicitis, cystitis, and urinary retention.  CTA has no evidence of AAA pancreatitis cholecystitis kidney stone or diverticulitis or appendicitis.  There is concern for possible partial SBO and patient has a hernia as noted above which I suspect is not causing his symptoms at this time.  ECG and initial troponin not consistent with ACS.  CBC shows leukocytosis with WBC count of 15 but normal hemoglobin.  CMP shows no significant electrolyte or metabolic derangements.  Lipase is unremarkable not consistent with acute pancreatitis.  UA not suggestive of kidney infection given patient Raquel Sarna denies any urinary symptoms  Given findings concerning for partial SBO discussed patient with on-call surgeon Dr. Peyton Najjar.  Given history of similar presentation in 2020 was managed conservatively and primary followed by GI did not recommend surgery admission or any other acute surgical intervention at this time.  Recommended medicine admission.  Patient admitted to medicine service.      ____________________________________________   FINAL CLINICAL IMPRESSION(S) / ED DIAGNOSES  Final diagnoses:  Partial small bowel obstruction (HCC)    Medications  enoxaparin (LOVENOX) injection 65 mg (has no administration in time range)  lactated ringers infusion (has no administration in time range)  acetaminophen (TYLENOL) tablet 650 mg (has no administration in time range)    Or  acetaminophen (TYLENOL) suppository 650 mg (has no administration in time range)  morphine 2 MG/ML injection 2 mg (has no administration in time range)  allopurinol (ZYLOPRIM) tablet 300 mg (has no administration in time range)  amLODipine (NORVASC) tablet 2.5 mg (has no administration in time range)  finasteride (PROSCAR) tablet 5 mg  (has no administration in time range)  losartan (COZAAR) tablet 100 mg (has no administration in time range)  metoprolol tartrate (LOPRESSOR) tablet 50 mg (has no administration in time range)  oxybutynin (DITROPAN) tablet 5 mg (has no administration in time range)  traZODone (DESYREL) tablet 150 mg (has no administration in time range)  labetalol (NORMODYNE) injection 10 mg (has no administration in time range)  HYDROmorphone (DILAUDID) injection 0.5 mg (0.5 mg Intravenous Given 09/13/20 1803)  ondansetron (ZOFRAN) injection 4 mg (4 mg Intravenous Given  09/13/20 1739)  iohexol (OMNIPAQUE) 300 MG/ML solution 125 mL (125 mLs Intravenous Contrast Given 09/13/20 1818)     ED Discharge Orders    None       Note:  This document was prepared using Dragon voice recognition software and may include unintentional dictation errors.   Lucrezia Starch, MD 09/13/20 [phone]

## 2020-09-13 NOTE — Progress Notes (Signed)
PHARMACIST - PHYSICIAN COMMUNICATION  CONCERNING:  Enoxaparin (Lovenox) for DVT Prophylaxis    RECOMMENDATION: Patient was prescribed enoxaparin 40 mg q24 hours for VTE prophylaxis.   Filed Weights   09/13/20 1630  Weight: 129.3 kg (285 lb)    Body mass index is 46 kg/m.  Estimated Creatinine Clearance: 71.7 mL/min (by C-G formula based on SCr of 1.15 mg/dL).   Based on [place] patient is candidate for enoxaparin 0.5mg /kg TBW SQ every 24 hours based on BMI > 30.   DESCRIPTION: Pharmacy has adjusted enoxaparin dose per [facility] policy.  Patient is now receiving enoxaparin 65 mg every 24 hours    Tawnya Crook, PharmD Clinical Pharmacist  09/13/2020 7:29 PM

## 2020-09-13 NOTE — H&P (Addendum)
History and Physical    Isaiah Walker IWL:[id number] DOB: January 15, 1946 DOA: 09/13/2020  PCP: [name], MD   Patient coming from: Home  I have personally briefly reviewed patient's old medical records in [place]  Chief Complaint: Abdominal pain  HPI: Isaiah Walker is a 75 y.o. male with medical history significant for HTN, BPH, class III obesity, Crohn's disease with history of ileocecal resection with ileocolonic anastomosis, complicated by stricture and SBO requiring dilatation on 02/03/2019, who presents to the emergency room with a 1 day history of lower abdominal pain that awoke him from sleep at 5 AM in the morning.  He was previously in his usual state of health.  Pain has been sharp, crampy, waxing and waning with no aggravating or alleviating factors.  Associated with nausea but no vomiting.  He denies fever or chills and denies change in bowel habits or dysuria.  Denies cough or shortness of breath. ED Course: On arrival, BP 187/101, pulse 90, respirations 20 with O2 sat 94% on room air and afebrile.  Blood work significant for leukocytosis of 15,000 and mildly elevated bilirubinemia of 1.4.  Lipase normal at 24, troponin normal at 5.  Urinalysis unremarkable. EKG as reviewed by me : Sinus rhythm at 83 with no acute ST-T wave changes Imaging: CT abdomen and pelvis 'Mild dilation of the mid to distal small bowel, to the level of the ileocolic anastomosis. Findings favor early or intermittent small bowel obstruction' with otherwise nonacute findings  The ED provider spoke with surgeon, Dr. Peyton Najjar who recommended conservative management GI consult.  Hospitalist consulted for admission.  Review of Systems: As per HPI otherwise all other systems on review of systems negative.    Past Medical History:  Diagnosis Date  . Arthritis   . BPH (benign prostatic hyperplasia)   . Crohn's disease ([place])   . Hypertension   . Kidney stones     Past Surgical History:  Procedure  Laterality Date  . CHOLECYSTECTOMY    . CHOLECYSTECTOMY, LAPAROSCOPIC N/A   . COLON SURGERY     Colectomy 2005  . COLONOSCOPY N/A 01/11/2019   Procedure: COLONOSCOPY;  Surgeon: Virgel Manifold, MD;  Location: [facility] ENDOSCOPY;  Service: Endoscopy;  Laterality: N/A;  . COLONOSCOPY WITH PROPOFOL N/A 02/11/2019   Procedure: COLONOSCOPY WITH PROPOFOL;  Surgeon: Lin Landsman, MD;  Location: [facility] ENDOSCOPY;  Service: Gastroenterology;  Laterality: N/A;  Pediatric endoscope  . CYSTOSCOPY W/ RETROGRADES Bilateral 04/01/2017   Procedure: CYSTOSCOPY WITH RETROGRADE PYELOGRAM;  Surgeon: Hollice Espy, MD;  Location: [facility] ORS;  Service: Urology;  Laterality: Bilateral;  . CYSTOSCOPY WITH STENT PLACEMENT Bilateral 04/01/2017   Procedure: CYSTOSCOPY WITH STENT PLACEMENT;  Surgeon: Hollice Espy, MD;  Location: [facility] ORS;  Service: Urology;  Laterality: Bilateral;  . CYSTOSCOPY/URETEROSCOPY/HOLMIUM LASER/STENT PLACEMENT Left 10/16/2014   Procedure: CYSTOSCOPY/URETEROSCOPY/HOLMIUM LASER/STENT PLACEMENT;  Surgeon: Hollice Espy, MD;  Location: [facility] ORS;  Service: Urology;  Laterality: Left;  . CYSTOSCOPY/URETEROSCOPY/HOLMIUM LASER/STENT PLACEMENT Bilateral 04/20/2017   Procedure: CYSTOSCOPY/URETEROSCOPY/HOLMIUM LASER/STENT EXCHANGE;  Surgeon: Hollice Espy, MD;  Location: [facility] ORS;  Service: Urology;  Laterality: Bilateral;  . HERNIA REPAIR    . Percutaneous Left    Nephrolithotomy     reports that he has never smoked. He has never used smokeless tobacco. He reports that he does not drink alcohol and does not use drugs.  No Known Allergies  History reviewed. No pertinent family history.    Prior to Admission medications   Medication Sig Start Date  End Date Taking? Authorizing Provider  allopurinol (ZYLOPRIM) 300 MG tablet Take 300 mg by mouth daily.    [provider]  amLODipine (NORVASC) 2.5 MG tablet Take 2.5 mg by mouth daily.    [provider]  finasteride (PROSCAR)  5 MG tablet Take 1 tablet (5 mg total) by mouth daily. 04/20/15   Nickie Retort, MD  losartan (COZAAR) 100 MG tablet TAKE 1 TABLET BY MOUTH EVERY DAY IN THE MORNING 07/19/18   [provider]  meloxicam (MOBIC) 15 MG tablet Take 15 mg by mouth daily as needed.  07/25/18   [provider]  metoprolol (LOPRESSOR) 50 MG tablet Take 50 mg by mouth 2 (two) times daily.    [provider]  Multiple Vitamin (MULTIVITAMIN WITH MINERALS) TABS tablet Take 1 tablet by mouth daily.    [provider]  phentermine 37.5 MG capsule Take 37.5 mg by mouth every morning.    [provider]  traZODone (DESYREL) 150 MG tablet Take 150 mg by mouth at bedtime.    [provider]    Physical Exam: Vitals:   09/13/20 1627 09/13/20 1630 09/13/20 1728 09/13/20 1807  BP: (!) 187/101   (!) 175/76  Pulse: 90  96 (!) 108  Resp: 20  (!) 22 (!) 35  Temp: 98.5 F (36.9 C)     TempSrc: Oral     SpO2: 94%  96% 91%  Weight:  129.3 kg    Height:  5\' 6"  (1.676 m)       Vitals:   09/13/20 1627 09/13/20 1630 09/13/20 1728 09/13/20 1807  BP: (!) 187/101   (!) 175/76  Pulse: 90  96 (!) 108  Resp: 20  (!) 22 (!) 35  Temp: 98.5 F (36.9 C)     TempSrc: Oral     SpO2: 94%  96% 91%  Weight:  129.3 kg    Height:  5\' 6"  (1.676 m)        Constitutional: Alert and oriented x 3 . Not in any apparent distress HEENT:      Head: Normocephalic and atraumatic.         Eyes: PERLA, EOMI, Conjunctivae are normal. Sclera is non-icteric.       Mouth/Throat: Mucous membranes are moist.       Neck: Supple with no signs of meningismus. Cardiovascular: Regular rate and rhythm. No murmurs, gallops, or rubs. 2+ symmetrical distal pulses are present . No JVD. No LE edema Respiratory: Respiratory effort normal .Lungs sounds clear bilaterally. No wheezes, crackles, or rhonchi.  Gastrointestinal: Soft, non tender, and non distended with positive bowel sounds.  Genitourinary: No CVA  tenderness. Musculoskeletal: Nontender with normal range of motion in all extremities. No cyanosis, or erythema of extremities. Neurologic:  Face is symmetric. Moving all extremities. No gross focal neurologic deficits . Skin: Skin is warm, dry.  No rash or ulcers Psychiatric: Mood and affect are normal    Labs on Admission: I have personally reviewed following labs and imaging studies  CBC: Recent Labs  Lab 09/13/20 1738  WBC 15.0*  NEUTROABS 12.9*  HGB 16.2  HCT 47.5  MCV 87.0  PLT 347   Basic Metabolic Panel: Recent Labs  Lab 09/13/20 1738  NA 136  K 4.6  CL 101  CO2 23  GLUCOSE 131*  BUN 22  CREATININE 1.15  CALCIUM 9.9   GFR: Estimated Creatinine Clearance: 71.7 mL/min (by C-G formula based on SCr of 1.15 mg/dL). Liver Function Tests: Recent  Labs  Lab 09/13/20 1738  AST 26  ALT 42  ALKPHOS 37*  BILITOT 1.4*  PROT 7.7  ALBUMIN 4.5   Recent Labs  Lab 09/13/20 1738  LIPASE 24   No results for input(s): AMMONIA in the last 168 hours. Coagulation Profile: No results for input(s): INR, PROTIME in the last 168 hours. Cardiac Enzymes: No results for input(s): CKTOTAL, CKMB, CKMBINDEX, TROPONINI in the last 168 hours. BNP (last 3 results) No results for input(s): PROBNP in the last 8760 hours. HbA1C: No results for input(s): HGBA1C in the last 72 hours. CBG: No results for input(s): GLUCAP in the last 168 hours. Lipid Profile: No results for input(s): CHOL, HDL, LDLCALC, TRIG, CHOLHDL, LDLDIRECT in the last 72 hours. Thyroid Function Tests: No results for input(s): TSH, T4TOTAL, FREET4, T3FREE, THYROIDAB in the last 72 hours. Anemia Panel: No results for input(s): VITAMINB12, FOLATE, FERRITIN, TIBC, IRON, RETICCTPCT in the last 72 hours. Urine analysis:    Component Value Date/Time   COLORURINE YELLOW (A) 09/13/2020 1807   APPEARANCEUR CLEAR (A) 09/13/2020 1807   APPEARANCEUR Cloudy (A) 05/01/2017 0937   LABSPEC 1.019 09/13/2020 1807   PHURINE  5.0 09/13/2020 1807   GLUCOSEU NEGATIVE 09/13/2020 1807   HGBUR NEGATIVE 09/13/2020 1807   BILIRUBINUR NEGATIVE 09/13/2020 1807   BILIRUBINUR Negative 05/01/2017 0937   KETONESUR 5 (A) 09/13/2020 1807   PROTEINUR NEGATIVE 09/13/2020 1807   NITRITE NEGATIVE 09/13/2020 1807   LEUKOCYTESUR TRACE (A) 09/13/2020 1807    Radiological Exams on Admission: CT ABDOMEN PELVIS W CONTRAST  Result Date: 09/13/2020 CLINICAL DATA:  Periumbilical pain, history of Crohn disease, palpable umbilical mass EXAM: CT ABDOMEN AND PELVIS WITH CONTRAST TECHNIQUE: Multidetector CT imaging of the abdomen and pelvis was performed using the standard protocol following bolus administration of intravenous contrast. CONTRAST:  112mL OMNIPAQUE IOHEXOL 300 MG/ML  SOLN COMPARISON:  01/24/2019 FINDINGS: Lower chest: No acute pleural or parenchymal lung disease. Hepatobiliary: Diffuse hepatic steatosis. No focal liver abnormality. Gallbladder surgically absent. Pancreas: Unremarkable. No pancreatic ductal dilatation or surrounding inflammatory changes. Spleen: Normal in size without focal abnormality. Adrenals/Urinary Tract: Stable bilateral renal cortical atrophy and bilateral renal cortical cysts. Nonobstructing calculi within the lower pole left kidney measure up to 2.4 cm in size. No obstructive uropathy within either kidney. The bladder is minimally distended without focal abnormality. The adrenals are normal. Stomach/Bowel: There is mild dilation of the mid to distal small bowel measuring up to 3.2 cm in diameter. There is no abrupt transition point, with the distal small bowel distension extending to the ileocolic anastomosis in the right lower quadrant. Favor early obstruction. There is no bowel wall thickening or inflammatory change. Vascular/Lymphatic: Aortic atherosclerosis. No enlarged abdominal or pelvic lymph nodes. Reproductive: Prostate is unremarkable. Other: No free fluid or free gas. Fat containing supraumbilical ventral  hernia, with abdominal wall defect measuring up to 1.2 cm. No bowel herniation. Musculoskeletal: No acute or destructive bony lesions. Reconstructed images demonstrate no additional findings. IMPRESSION: 1. Mild dilation of the mid to distal small bowel, to the level of the ileocolic anastomosis. Findings favor early or intermittent small bowel obstruction. 2. Diffuse hepatic steatosis. 3. Stable nonobstructing left renal calculi. 4. Fat containing supraumbilical ventral hernia, corresponding to the patient's palpable abnormality. 5.  Aortic Atherosclerosis (ICD10-I70.0). Electronically Signed   By: Randa Ngo M.D.   On: 09/13/2020 18:35     Assessment/Plan 75 year old male with history of HTN, BPH, class III obesity, Crohn's disease with history of ileocecal resection with ileocolonic  anastomosis, complicated by stricture and SBO requiring dilatation in August 2020, presenting with acute abdominal pain without diarrhea or vomiting.  CT showing partial obstruction at the level of the previous ileocecal anastomosis    Partial small bowel obstruction (H[facility])   Crohn's disease s/p terminal ileocecal resection with hx of stricture at anastomotic site requiring dilatation -CT abdomen and pelvis showed mild dilation of the mid to distal small bowel, to the level of the ileocolic anastomosis. Findings favor early or intermittent small bowel obstruction -Conservative management for now per recommendation of Dr. Cassell Clement conversation with ED provider -We will keep n.p.o. -IV fluids, IV antiemetics and IV narcotic pain meds -GI consult  Possible Crohn's flare -Following admission patient developed profuse diarrhea without blood having several episodes of runny stools -Stool for C. difficile and GI panel resulted negative -Started empirically on methylprednisolone 40 mg IV twice daily pending further GI recommendations (patient was similarly treated during his last hospitalization for bowel  obstruction)    HTN (hypertension) -Labetalol as needed for blood pressure while n.p.o.    BPH (benign prostatic hyperplasia) -Continue home Os-Cal    Obesity, Class III, BMI 40-49.9 (morbid obesity) ([place]) -Complicating factor to overall prognosis    DVT prophylaxis: Lovenox  Code Status: full code  Family Communication:  none  Disposition Plan: Back to previous home environment Consults called: GI Status: Observation    Athena Masse MD [facility]     09/13/2020, 7:28 PM

## 2020-09-14 DIAGNOSIS — Z79899 Other long term (current) drug therapy: Secondary | ICD-10-CM | POA: Diagnosis not present

## 2020-09-14 DIAGNOSIS — I10 Essential (primary) hypertension: Secondary | ICD-10-CM | POA: Diagnosis not present

## 2020-09-14 DIAGNOSIS — K56600 Partial intestinal obstruction, unspecified as to cause: Secondary | ICD-10-CM | POA: Diagnosis not present

## 2020-09-14 DIAGNOSIS — K760 Fatty (change of) liver, not elsewhere classified: Secondary | ICD-10-CM | POA: Diagnosis not present

## 2020-09-14 DIAGNOSIS — N179 Acute kidney failure, unspecified: Secondary | ICD-10-CM | POA: Diagnosis not present

## 2020-09-14 DIAGNOSIS — M109 Gout, unspecified: Secondary | ICD-10-CM | POA: Diagnosis not present

## 2020-09-14 DIAGNOSIS — N400 Enlarged prostate without lower urinary tract symptoms: Secondary | ICD-10-CM | POA: Diagnosis not present

## 2020-09-14 DIAGNOSIS — Z87442 Personal history of urinary calculi: Secondary | ICD-10-CM | POA: Diagnosis not present

## 2020-09-14 DIAGNOSIS — R197 Diarrhea, unspecified: Secondary | ICD-10-CM | POA: Diagnosis present

## 2020-09-14 DIAGNOSIS — Z20822 Contact with and (suspected) exposure to covid-19: Secondary | ICD-10-CM | POA: Diagnosis not present

## 2020-09-14 DIAGNOSIS — Z8719 Personal history of other diseases of the digestive system: Secondary | ICD-10-CM | POA: Diagnosis not present

## 2020-09-14 DIAGNOSIS — Z9049 Acquired absence of other specified parts of digestive tract: Secondary | ICD-10-CM | POA: Diagnosis not present

## 2020-09-14 DIAGNOSIS — R000 Tachycardia, unspecified: Secondary | ICD-10-CM | POA: Diagnosis not present

## 2020-09-14 DIAGNOSIS — Z6841 Body Mass Index (BMI) 40.0 and over, adult: Secondary | ICD-10-CM | POA: Diagnosis not present

## 2020-09-14 DIAGNOSIS — R109 Unspecified abdominal pain: Secondary | ICD-10-CM | POA: Diagnosis not present

## 2020-09-14 DIAGNOSIS — K5669 Other partial intestinal obstruction: Secondary | ICD-10-CM | POA: Diagnosis not present

## 2020-09-14 LAB — C-REACTIVE PROTEIN: CRP: 6 mg/dL — ABNORMAL HIGH (ref <1.0)

## 2020-09-14 LAB — GASTROINTESTINAL PANEL BY PCR, STOOL (REPLACES STOOL CULTURE)

## 2020-09-14 LAB — C DIFFICILE QUICK SCREEN W PCR REFLEX
C Diff antigen: NEGATIVE
C Diff interpretation: NOT DETECTED
C Diff toxin: NEGATIVE

## 2020-09-14 LAB — SARS CORONAVIRUS 2 (TAT 6-24 HRS): SARS Coronavirus 2: NEGATIVE

## 2020-09-14 MED ORDER — SIMETHICONE 80 MG PO CHEW: 80.0000 mg | CHEWABLE_TABLET | Freq: Four times a day (QID) | ORAL | Status: DC | PRN

## 2020-09-14 MED ORDER — METHYLPREDNISOLONE SODIUM SUCC 40 MG IJ SOLR
40.0000 mg | Freq: Two times a day (BID) | INTRAMUSCULAR | Status: DC
  Administered 2020-09-14: 40 mg via INTRAVENOUS
  Filled 2020-09-14: qty 1

## 2020-09-14 MED ORDER — ALUM & MAG HYDROXIDE-SIMETH 200-200-20 MG/5ML PO SUSP
30.0000 mL | Freq: Four times a day (QID) | ORAL | Status: DC | PRN
  Administered 2020-09-14 – 2020-09-15 (×2): 30 mL via ORAL
  Filled 2020-09-14 (×2): qty 30

## 2020-09-14 NOTE — Plan of Care (Signed)
Pt has had several loose stools this shift. C diff and GI panel negative. Oncall provider informed. V/S stable. Has minimal pain on his abdomen; declined to take pain meds as it was tolerable.

## 2020-09-14 NOTE — Plan of Care (Signed)

## 2020-09-14 NOTE — Progress Notes (Signed)
PROGRESS NOTE  Isaiah Walker ZOX:[id number] DOB: September 05, 1945 DOA: 09/13/2020 PCP: [name], MD   LOS: 0 days   Brief Narrative / Interim history: 75 year old male with history of HTN, BPH, obesity, Crohn's disease with history of ileocecal resection with ileocolonic anastomosis, complicated by stricture and prior history of small bowel obstruction requiring dilatation in August 2020 who came into the hospital with lower abdominal pain, sharp, crampy, no nausea or vomiting but profuse diarrhea.  CT scan on admission showed mild dilation of the mid to distal small bowel to the level of ileocolic anastomosis, possibly early or intermittent small bowel obstruction.  He had leukocytosis on admission of 15 K, UA was unremarkable C. difficile and GI pathogen panel were negative.  GI and general surgery were consulted  Subjective / 24h Interval events: Complains of ongoing diarrhea and abdominal pain.  States when "I need to go, I need to go".  No nausea or vomiting  Assessment & Plan: Principal Problem Crohn's disease with history of terminal ileocecal resection, history of stricture at anastomotic site requiring dilatation, concern for pSBO /enteritis/Chron's flare -His enteritis appears to be noninfectious, C. difficile and GI pathogen panels negative.  He has a history of Crohn's disease, empirically started on Solu-Medrol on admission, awaiting GI input, appreciate consultation -General surgery consulted as well  Active Problems Essential hypertension -continue home medications  BPH -continue home medications  Obesity, class III-he would benefit from weight loss  Scheduled Meds: . allopurinol  300 mg Oral Daily  . amLODipine  2.5 mg Oral Daily  . enoxaparin (LOVENOX) injection  0.5 mg/kg Subcutaneous Q24H  . finasteride  5 mg Oral Daily  . losartan  100 mg Oral Daily  . methylPREDNISolone (SOLU-MEDROL) injection  40 mg Intravenous Q12H  . metoprolol tartrate  50 mg Oral BID  .  oxybutynin  5 mg Oral q morning  . traZODone  150 mg Oral QHS   Continuous Infusions: . lactated ringers 100 mL/hr at 09/14/20 1032   PRN Meds:.acetaminophen **OR** acetaminophen, labetalol, morphine injection, ondansetron (ZOFRAN) IV  Diet Orders (From admission, onward)    Start     Ordered   09/13/20 1941  Diet NPO time specified Except for: Sips with Meds, Ice Chips  Diet effective now       Question Answer Comment  Except for Sips with Meds   Except for Ice Chips      09/13/20 1940          DVT prophylaxis:      Code Status: Full Code  Family Communication: no family at bedside   Status is: Observation  The patient will require care spanning > 2 midnights and should be moved to inpatient because: Inpatient level of care appropriate due to severity of illness  Dispo: The patient is from: Home              Anticipated d/c is to: Home              Patient currently is not medically stable to d/c.   Difficult to place patient No   Level of care: Med-Surg  Consultants:  General surgery  GI  Procedures:  none  Microbiology  C diff, GI pathogen panel - negative  Antimicrobials: none    Objective: Vitals:   09/13/20 2105 09/13/20 2338 09/14/20 0444 09/14/20 1013  BP: (!) 168/83 (!) 171/73 108/68 (!) 141/82  Pulse: 96 97 78 71  Resp: 16 18 18 18   Temp: 98 F (36.7  C) (!) 97.5 F (36.4 C) 97.6 F (36.4 C) 97.9 F (36.6 C)  TempSrc:   Oral Oral  SpO2: 96% 93% 96% 91%  Weight:      Height: 5' 5.98" (1.676 m)       Intake/Output Summary (Last 24 hours) at 09/14/2020 1117 Last data filed at 09/14/2020 0318 Gross per 24 hour  Intake 709.16 ml  Output --  Net 709.16 ml   Filed Weights   09/13/20 1630  Weight: 129.3 kg    Examination:  Constitutional: NAD Eyes: no scleral icterus ENMT: Mucous membranes are moist.  Neck: normal, supple Respiratory: clear to auscultation bilaterally, no wheezing, no crackles. Normal respiratory effort. No  accessory muscle use.  Cardiovascular: Regular rate and rhythm, no murmurs / rubs / gallops. No LE edema. Good peripheral pulses Abdomen: Minimally tender to palpation throughout, no guarding or rebound. Bowel sounds positive.  Musculoskeletal: no clubbing / cyanosis.  Skin: no rashes Neurologic: CN 2-12 grossly intact. Strength 5/5 in all 4.   Data Reviewed: I have independently reviewed following labs and imaging studies   CBC: Recent Labs  Lab 09/13/20 1738  WBC 15.0*  NEUTROABS 12.9*  HGB 16.2  HCT 47.5  MCV 87.0  PLT 660   Basic Metabolic Panel: Recent Labs  Lab 09/13/20 1738  NA 136  K 4.6  CL 101  CO2 23  GLUCOSE 131*  BUN 22  CREATININE 1.15  CALCIUM 9.9   Liver Function Tests: Recent Labs  Lab 09/13/20 1738  AST 26  ALT 42  ALKPHOS 37*  BILITOT 1.4*  PROT 7.7  ALBUMIN 4.5   Coagulation Profile: No results for input(s): INR, PROTIME in the last 168 hours. HbA1C: No results for input(s): HGBA1C in the last 72 hours. CBG: No results for input(s): GLUCAP in the last 168 hours.  Recent Results (from the past 240 hour(s))  C Difficile Quick Screen w PCR reflex     Status: None   Collection Time: 09/14/20  1:28 AM   Specimen: Stool  Result Value Ref Range Status   C Diff antigen NEGATIVE NEGATIVE Final   C Diff toxin NEGATIVE NEGATIVE Final   C Diff interpretation No C. difficile detected.  Final    Comment: Performed at [facility], [address] [place], [place] [address]  Gastrointestinal Panel by PCR , Stool     Status: None   Collection Time: 09/14/20  1:28 AM   Specimen: Stool  Result Value Ref Range Status   Campylobacter species NOT DETECTED NOT DETECTED Final   Plesimonas shigelloides NOT DETECTED NOT DETECTED Final   Salmonella species NOT DETECTED NOT DETECTED Final   Yersinia enterocolitica NOT DETECTED NOT DETECTED Final   Vibrio species NOT DETECTED NOT DETECTED Final   Vibrio cholerae NOT DETECTED NOT DETECTED Final    Enteroaggregative E coli (EAEC) NOT DETECTED NOT DETECTED Final   Enteropathogenic E coli (EPEC) NOT DETECTED NOT DETECTED Final   Enterotoxigenic E coli (ETEC) NOT DETECTED NOT DETECTED Final   Shiga like toxin producing E coli (STEC) NOT DETECTED NOT DETECTED Final   Shigella/Enteroinvasive E coli (EIEC) NOT DETECTED NOT DETECTED Final   Cryptosporidium NOT DETECTED NOT DETECTED Final   Cyclospora cayetanensis NOT DETECTED NOT DETECTED Final   Entamoeba histolytica NOT DETECTED NOT DETECTED Final   Giardia lamblia NOT DETECTED NOT DETECTED Final   Adenovirus F40/41 NOT DETECTED NOT DETECTED Final   Astrovirus NOT DETECTED NOT DETECTED Final   Norovirus GI/GII NOT DETECTED NOT  DETECTED Final   Rotavirus A NOT DETECTED NOT DETECTED Final   Sapovirus (I, II, IV, and V) NOT DETECTED NOT DETECTED Final    Comment: Performed at [facility], [address], [place], [place] [address]     Radiology Studies: CT ABDOMEN PELVIS W CONTRAST  Result Date: 09/13/2020 CLINICAL DATA:  Periumbilical pain, history of Crohn disease, palpable umbilical mass EXAM: CT ABDOMEN AND PELVIS WITH CONTRAST TECHNIQUE: Multidetector CT imaging of the abdomen and pelvis was performed using the standard protocol following bolus administration of intravenous contrast. CONTRAST:  151mL OMNIPAQUE IOHEXOL 300 MG/ML  SOLN COMPARISON:  01/24/2019 FINDINGS: Lower chest: No acute pleural or parenchymal lung disease. Hepatobiliary: Diffuse hepatic steatosis. No focal liver abnormality. Gallbladder surgically absent. Pancreas: Unremarkable. No pancreatic ductal dilatation or surrounding inflammatory changes. Spleen: Normal in size without focal abnormality. Adrenals/Urinary Tract: Stable bilateral renal cortical atrophy and bilateral renal cortical cysts. Nonobstructing calculi within the lower pole left kidney measure up to 2.4 cm in size. No obstructive uropathy within either kidney. The bladder is minimally distended  without focal abnormality. The adrenals are normal. Stomach/Bowel: There is mild dilation of the mid to distal small bowel measuring up to 3.2 cm in diameter. There is no abrupt transition point, with the distal small bowel distension extending to the ileocolic anastomosis in the right lower quadrant. Favor early obstruction. There is no bowel wall thickening or inflammatory change. Vascular/Lymphatic: Aortic atherosclerosis. No enlarged abdominal or pelvic lymph nodes. Reproductive: Prostate is unremarkable. Other: No free fluid or free gas. Fat containing supraumbilical ventral hernia, with abdominal wall defect measuring up to 1.2 cm. No bowel herniation. Musculoskeletal: No acute or destructive bony lesions. Reconstructed images demonstrate no additional findings. IMPRESSION: 1. Mild dilation of the mid to distal small bowel, to the level of the ileocolic anastomosis. Findings favor early or intermittent small bowel obstruction. 2. Diffuse hepatic steatosis. 3. Stable nonobstructing left renal calculi. 4. Fat containing supraumbilical ventral hernia, corresponding to the patient's palpable abnormality. 5.  Aortic Atherosclerosis (ICD10-I70.0). Electronically Signed   By: Randa Ngo M.D.   On: 09/13/2020 18:35    Marzetta Board, MD, PhD [facility]  Between 7 am - 7 pm I am available, please contact me via Amion or Securechat  Between 7 pm - 7 am I am not available, please contact night coverage MD/APP via Amion

## 2020-09-14 NOTE — Consult Note (Signed)
SURGICAL CONSULTATION NOTE   HISTORY OF PRESENT ILLNESS (HPI):  76 y.o. male presented to [facility] ED for evaluation of abdominal pain since 2 days ago. Patient reports pain has been generalized abdominal pain but no localized abdominal pain.  No pain with patient.  There has been no alleviating or aggravating factor.  The patient denies any fever or chills.  The patient reported that last night he had about 10 episodes of diarrhea.  Denies nausea or vomiting.  Patient has history of Crohn disease.  He had ileocecectomy done more than 20 years ago.  This was not the Crohn's disease was diagnosed.  Patient did not have treatment after the surgery and 20 years later he developed a suspected stricture of the anastomosis.  He was evaluated by Dr. Marius Ditch and had a colonoscopy with dilation of the anastomosis.  No sign of Crohn disease was identified on the colonoscopy.  Before the dilation he was treated on the previous admission with steroids.  At last evaluation by Dr. Marius Ditch it was decided not to treat him with immunomodulators or anti-TNF therapy due to patient age.  In the ED he was again found with CT scan that showed mild small bowel dilation with no transition point.  There was no free air or free fluid.  I personally evaluated the images.  Patient had leukocytosis of 15,000.  There is no significant electrolyte disturbance.  No UTI.  This morning patient with no significant pain but bothered by the 10 episode of diarrhea during the night.  Surgery is consulted by Dr. Damita Dunnings in this context for evaluation and management of small bowel obstruction.  PAST MEDICAL HISTORY (PMH):  Past Medical History:  Diagnosis Date  . Arthritis   . BPH (benign prostatic hyperplasia)   . Crohn's disease ([place])   . Hypertension   . Kidney stones      PAST SURGICAL HISTORY [facility]):  Past Surgical History:  Procedure Laterality Date  . CHOLECYSTECTOMY    . CHOLECYSTECTOMY, LAPAROSCOPIC N/A   . COLON SURGERY      Colectomy 2005  . COLONOSCOPY N/A 01/11/2019   Procedure: COLONOSCOPY;  Surgeon: Virgel Manifold, MD;  Location: [facility] ENDOSCOPY;  Service: Endoscopy;  Laterality: N/A;  . COLONOSCOPY WITH PROPOFOL N/A 02/11/2019   Procedure: COLONOSCOPY WITH PROPOFOL;  Surgeon: Lin Landsman, MD;  Location: [facility] ENDOSCOPY;  Service: Gastroenterology;  Laterality: N/A;  Pediatric endoscope  . CYSTOSCOPY W/ RETROGRADES Bilateral 04/01/2017   Procedure: CYSTOSCOPY WITH RETROGRADE PYELOGRAM;  Surgeon: Hollice Espy, MD;  Location: [facility] ORS;  Service: Urology;  Laterality: Bilateral;  . CYSTOSCOPY WITH STENT PLACEMENT Bilateral 04/01/2017   Procedure: CYSTOSCOPY WITH STENT PLACEMENT;  Surgeon: Hollice Espy, MD;  Location: [facility] ORS;  Service: Urology;  Laterality: Bilateral;  . CYSTOSCOPY/URETEROSCOPY/HOLMIUM LASER/STENT PLACEMENT Left 10/16/2014   Procedure: CYSTOSCOPY/URETEROSCOPY/HOLMIUM LASER/STENT PLACEMENT;  Surgeon: Hollice Espy, MD;  Location: [facility] ORS;  Service: Urology;  Laterality: Left;  . CYSTOSCOPY/URETEROSCOPY/HOLMIUM LASER/STENT PLACEMENT Bilateral 04/20/2017   Procedure: CYSTOSCOPY/URETEROSCOPY/HOLMIUM LASER/STENT EXCHANGE;  Surgeon: Hollice Espy, MD;  Location: [facility] ORS;  Service: Urology;  Laterality: Bilateral;  . HERNIA REPAIR    . Percutaneous Left    Nephrolithotomy     MEDICATIONS:  Prior to Admission medications   Medication Sig Start Date End Date Taking? Authorizing Provider  allopurinol (ZYLOPRIM) 300 MG tablet Take 300 mg by mouth daily.   Yes [provider]  amLODipine (NORVASC) 2.5 MG tablet Take 2.5 mg by mouth daily.   Yes [provider]  finasteride (PROSCAR) 5 MG tablet Take 1 tablet (5 mg total) by mouth daily. 04/20/15  Yes Nickie Retort, MD  losartan (COZAAR) 100 MG tablet TAKE 1 TABLET BY MOUTH EVERY DAY IN THE MORNING 07/19/18  Yes [provider]  meloxicam (MOBIC) 15 MG tablet Take 15 mg by mouth daily as needed.  07/25/18  Yes  [provider]  metoprolol (LOPRESSOR) 50 MG tablet Take 50 mg by mouth 2 (two) times daily.   Yes [provider]  oxybutynin (DITROPAN) 5 MG tablet Take 5 mg by mouth every morning. 08/11/20  Yes [provider]  traZODone (DESYREL) 150 MG tablet Take 150 mg by mouth at bedtime.   Yes [provider]     ALLERGIES:  No Known Allergies   SOCIAL HISTORY:  Social History   Socioeconomic History  . Marital status: Married    Spouse name: Not on file  . Number of children: Not on file  . Years of education: Not on file  . Highest education level: Not on file  Occupational History  . Not on file  Tobacco Use  . Smoking status: Never Smoker  . Smokeless tobacco: Never Used  Vaping Use  . Vaping Use: Never used  Substance and Sexual Activity  . Alcohol use: No  . Drug use: No  . Sexual activity: Not on file  Other Topics Concern  . Not on file  Social History Narrative  . Not on file   Social Determinants of Health   Financial Resource Strain: Not on file  Food Insecurity: Not on file  Transportation Needs: Not on file  Physical Activity: Not on file  Stress: Not on file  Social Connections: Not on file  Intimate Partner Violence: Not on file      FAMILY HISTORY:  History reviewed. No pertinent family history.   REVIEW OF SYSTEMS:  Constitutional: denies weight loss, fever, chills, or sweats  Eyes: denies any other vision changes, history of eye injury  ENT: denies sore throat, hearing problems  Respiratory: denies shortness of breath, wheezing  Cardiovascular: denies chest pain, palpitations  Gastrointestinal: Positive abdominal pain and diarrhea, negative for nausea and vomiting Genitourinary: denies burning with urination or urinary frequency Musculoskeletal: denies any other joint pains or cramps  Skin: denies any other rashes or skin discolorations  Neurological: denies any other headache, dizziness, weakness  Psychiatric:  denies any other depression, anxiety   All other review of systems were negative   VITAL SIGNS:  Temp:  [97.5 F (36.4 C)-98.5 F (36.9 C)] 97.6 F (36.4 C) (04/01 0444) Pulse Rate:  [78-108] 78 (04/01 0444) Resp:  [16-35] 18 (04/01 0444) BP: (108-187)/(68-101) 108/68 (04/01 0444) SpO2:  [91 %-96 %] 96 % (04/01 0444) Weight:  [129.3 kg] 129.3 kg (03/31 1630)     Height: 5' 5.98" (167.6 cm) Weight: 129.3 kg BMI (Calculated): 46.02   INTAKE/OUTPUT:  This shift: No intake/output data recorded.  Last 2 shifts: @IOLAST2SHIFTS @   PHYSICAL EXAM:  Constitutional:  -- Normal body habitus  -- Awake, alert, and oriented x3  Eyes:  -- Pupils equally round and reactive to light  -- No scleral icterus  Ear, nose, and throat:  -- No jugular venous distension  Pulmonary:  -- No crackles  -- Equal breath sounds bilaterally -- Breathing non-labored at rest Cardiovascular:  -- S1, S2 present  -- No pericardial rubs Gastrointestinal:  -- Abdomen soft, nontender, non-distended, no guarding or rebound tenderness -- No abdominal masses appreciated, pulsatile  or otherwise  Musculoskeletal and Integumentary:  -- Wounds: None appreciated -- Extremities: B/L UE and LE FROM, hands and feet warm, no edema  Neurologic:  -- Motor function: intact and symmetric -- Sensation: intact and symmetric   Labs:  CBC Latest Ref Rng & Units 09/13/2020 01/11/2019 01/10/2019  WBC 4.0 - 10.5 K/uL 15.0(H) 8.6 17.2(H)  Hemoglobin 13.0 - 17.0 g/dL 16.2 15.3 15.7  Hematocrit 39.0 - 52.0 % 47.5 46.8 47.3  Platelets 150 - 400 K/uL 181 215 212   CMP Latest Ref Rng & Units 09/13/2020 01/14/2019 01/12/2019  Glucose 70 - 99 mg/dL 131(H) 111(H) 109(H)  BUN 8 - 23 mg/dL 22 24(H) 29(H)  Creatinine 0.61 - 1.24 mg/dL 1.15 0.96 0.85  Sodium 135 - 145 mmol/L 136 138 140  Potassium 3.5 - 5.1 mmol/L 4.6 3.7 3.9  Chloride 98 - 111 mmol/L 101 107 110  CO2 22 - 32 mmol/L 23 25 24   Calcium 8.9 - 10.3 mg/dL 9.9 8.2(L) 8.2(L)   Total Protein 6.5 - 8.1 g/dL 7.7 - -  Total Bilirubin 0.3 - 1.2 mg/dL 1.4(H) - -  Alkaline Phos 38 - 126 U/L 37(L) - -  AST 15 - 41 U/L 26 - -  ALT 0 - 44 U/L 42 - -    Imaging studies:  EXAM: CT ABDOMEN AND PELVIS WITH CONTRAST  TECHNIQUE: Multidetector CT imaging of the abdomen and pelvis was performed using the standard protocol following bolus administration of intravenous contrast.  CONTRAST:  137mL OMNIPAQUE IOHEXOL 300 MG/ML  SOLN  COMPARISON:  01/24/2019  FINDINGS: Lower chest: No acute pleural or parenchymal lung disease.  Hepatobiliary: Diffuse hepatic steatosis. No focal liver abnormality. Gallbladder surgically absent.  Pancreas: Unremarkable. No pancreatic ductal dilatation or surrounding inflammatory changes.  Spleen: Normal in size without focal abnormality.  Adrenals/Urinary Tract: Stable bilateral renal cortical atrophy and bilateral renal cortical cysts. Nonobstructing calculi within the lower pole left kidney measure up to 2.4 cm in size. No obstructive uropathy within either kidney. The bladder is minimally distended without focal abnormality. The adrenals are normal.  Stomach/Bowel: There is mild dilation of the mid to distal small bowel measuring up to 3.2 cm in diameter. There is no abrupt transition point, with the distal small bowel distension extending to the ileocolic anastomosis in the right lower quadrant. Favor early obstruction.  There is no bowel wall thickening or inflammatory change.  Vascular/Lymphatic: Aortic atherosclerosis. No enlarged abdominal or pelvic lymph nodes.  Reproductive: Prostate is unremarkable.  Other: No free fluid or free gas. Fat containing supraumbilical ventral hernia, with abdominal wall defect measuring up to 1.2 cm. No bowel herniation.  Musculoskeletal: No acute or destructive bony lesions. Reconstructed images demonstrate no additional findings.  IMPRESSION: 1. Mild dilation of the  mid to distal small bowel, to the level of the ileocolic anastomosis. Findings favor early or intermittent small bowel obstruction. 2. Diffuse hepatic steatosis. 3. Stable nonobstructing left renal calculi. 4. Fat containing supraumbilical ventral hernia, corresponding to the patient's palpable abnormality. 5.  Aortic Atherosclerosis (ICD10-I70.0).   Electronically Signed   By: Randa Ngo M.D.   On: 09/13/2020 18:35  Assessment/Plan:  75 y.o. male with enteritis, complicated by pertinent comorbidities including history of Crohn disease, morbid obesity, hypertension.  Patient clinical picture not of true obstruction.  With a 10 episode of diarrhea it sounds more like enteritis.  The CT scan dilation of the small bowel is not impressive.  Patient also without nausea or vomiting.  Agree to hold  NGT at this moment.  Agree with consult to GI for recommendations regarding management of this enteritis especially if it is due to IBD flareup.  No surgical management indicated at this moment continue medical and conservative management.   Arnold Long, MD

## 2020-09-14 NOTE — Consult Note (Signed)
Isaiah Walker , MD [address] [place], [place], [place], [address] [address], [place], [place], [place], [address] Phone: [phone]  Fax: [phone]  Consultation  Referring Provider:     Dr Damita Dunnings Primary Care Physician:  [name], MD Primary Gastroenterologist:  Dr. Marius Ditch         Reason for Consultation:     Small bowel obstruction   Date of Admission:  09/13/2020 Date of Consultation:  09/14/2020         HPI:   Isaiah Walker is a 75 y.o. male who has seen Dr. Marius Ditch at our clinic in the past for Crohn's disease of the small bowel.  Last office visit on 02/28/2019.  Based on her last note the history goes back 20 years when she had abdominal pain and underwent surgery for a small bowel obstruction with ileocecal resection and primary anastomosis and was told she had Crohn's disease and placed on oral medication.  She was admitted in 02/03/2019 with small bowel obstruction at the ileocolonic anastomosis.  Managed conservatively and discharged patient underwent colonoscopy and was found to have an ulcerated ileocolonic anastomotic stricture which was unable to be transversed with adult colonoscopy.  Rest of the colon appeared normal.  Biopsies revealed focal ulceration of the anastomosis and mild architectural distortion of the colon.  Subsequently the patient underwent a CT enterography which revealed resolution of the obstruction and no evidence of active Crohn's disease or stricture.  The patient underwent repeat colonoscopy with the ileocolonic stricture that was dilated to 15 mm with neoterminal ileum appearing normal.  No active inflammation noted.  He was treated at that time with Xifaxan for IBS diarrhea.  Patient was admitted last night with abdominal pain of 2 days duration and a CT scan of the abdomen showed mild small bowel dilation with no transition point.  Stool studies were performed and are negative for any active infection.  He states that since 2020 he has  been doing really well.  All of a sudden on Thursday he started having nausea, abdominal distention but no vomiting and associated abdominal pain which brought him to the hospital.  Since he had a CAT scan he has been having watery bowel movements and his abdominal distention has been better.  Denies any nausea at this point of time.  Denies any NSAID use.  Denies any other complaints.   Past Medical History:  Diagnosis Date  . Arthritis   . BPH (benign prostatic hyperplasia)   . Crohn's disease ([place])   . Hypertension   . Kidney stones     Past Surgical History:  Procedure Laterality Date  . CHOLECYSTECTOMY    . CHOLECYSTECTOMY, LAPAROSCOPIC N/A   . COLON SURGERY     Colectomy 2005  . COLONOSCOPY N/A 01/11/2019   Procedure: COLONOSCOPY;  Surgeon: Virgel Manifold, MD;  Location: [facility] ENDOSCOPY;  Service: Endoscopy;  Laterality: N/A;  . COLONOSCOPY WITH PROPOFOL N/A 02/11/2019   Procedure: COLONOSCOPY WITH PROPOFOL;  Surgeon: Lin Landsman, MD;  Location: [facility] ENDOSCOPY;  Service: Gastroenterology;  Laterality: N/A;  Pediatric endoscope  . CYSTOSCOPY W/ RETROGRADES Bilateral 04/01/2017   Procedure: CYSTOSCOPY WITH RETROGRADE PYELOGRAM;  Surgeon: Hollice Espy, MD;  Location: [facility] ORS;  Service: Urology;  Laterality: Bilateral;  . CYSTOSCOPY WITH STENT PLACEMENT Bilateral 04/01/2017   Procedure: CYSTOSCOPY WITH STENT PLACEMENT;  Surgeon: Hollice Espy, MD;  Location: [facility] ORS;  Service: Urology;  Laterality: Bilateral;  . CYSTOSCOPY/URETEROSCOPY/HOLMIUM LASER/STENT PLACEMENT Left 10/16/2014  Procedure: CYSTOSCOPY/URETEROSCOPY/HOLMIUM LASER/STENT PLACEMENT;  Surgeon: Hollice Espy, MD;  Location: [facility] ORS;  Service: Urology;  Laterality: Left;  . CYSTOSCOPY/URETEROSCOPY/HOLMIUM LASER/STENT PLACEMENT Bilateral 04/20/2017   Procedure: CYSTOSCOPY/URETEROSCOPY/HOLMIUM LASER/STENT EXCHANGE;  Surgeon: Hollice Espy, MD;  Location: [facility] ORS;  Service: Urology;  Laterality: Bilateral;   . HERNIA REPAIR    . Percutaneous Left    Nephrolithotomy    Prior to Admission medications   Medication Sig Start Date End Date Taking? Authorizing Provider  allopurinol (ZYLOPRIM) 300 MG tablet Take 300 mg by mouth daily.   Yes [provider]  amLODipine (NORVASC) 2.5 MG tablet Take 2.5 mg by mouth daily.   Yes [provider]  finasteride (PROSCAR) 5 MG tablet Take 1 tablet (5 mg total) by mouth daily. 04/20/15  Yes Nickie Retort, MD  losartan (COZAAR) 100 MG tablet TAKE 1 TABLET BY MOUTH EVERY DAY IN THE MORNING 07/19/18  Yes [provider]  meloxicam (MOBIC) 15 MG tablet Take 15 mg by mouth daily as needed.  07/25/18  Yes [provider]  metoprolol (LOPRESSOR) 50 MG tablet Take 50 mg by mouth 2 (two) times daily.   Yes [provider]  oxybutynin (DITROPAN) 5 MG tablet Take 5 mg by mouth every morning. 08/11/20  Yes [provider]  traZODone (DESYREL) 150 MG tablet Take 150 mg by mouth at bedtime.   Yes [provider]    History reviewed. No pertinent family history.   Social History   Tobacco Use  . Smoking status: Never Smoker  . Smokeless tobacco: Never Used  Vaping Use  . Vaping Use: Never used  Substance Use Topics  . Alcohol use: No  . Drug use: No    Allergies as of 09/13/2020  . (No Known Allergies)    Review of Systems:    All systems reviewed and negative except where noted in HPI.   Physical Exam:  Vital signs in last 24 hours: Temp:  [97.5 F (36.4 C)-98.5 F (36.9 C)] 97.9 F (36.6 C) (04/01 1013) Pulse Rate:  [71-108] 71 (04/01 1013) Resp:  [16-35] 18 (04/01 1013) BP: (108-187)/(68-101) 141/82 (04/01 1013) SpO2:  [91 %-96 %] 91 % (04/01 1013) Weight:  [129.3 kg] 129.3 kg (03/31 1630) Last BM Date: 09/14/20 General:   Pleasant, cooperative in NAD Head:  Normocephalic and atraumatic. Eyes:   No icterus.   Conjunctiva pink. PERRLA. Ears:  Normal auditory acuity. Neck:  Supple;  no masses or thyroidomegaly Lungs: Respirations even and unlabored. Lungs clear to auscultation bilaterally.   No wheezes, crackles, or rhonchi.  Heart:  Regular rate and rhythm;  Without murmur, clicks, rubs or gallops Abdomen:  Soft, moderate distention nontender. Normal bowel sounds. No appreciable masses or hepatomegaly.  No rebound or guarding.  Neurologic:  Alert and oriented x3;  grossly normal neurologically. Skin:  Intact without significant lesions or rashes. Cervical Nodes:  No significant cervical adenopathy. Psych:  Alert and cooperative. Normal affect.  LAB RESULTS: Recent Labs    09/13/20 1738  WBC 15.0*  HGB 16.2  HCT 47.5  PLT 181   BMET Recent Labs    09/13/20 1738  NA 136  K 4.6  CL 101  CO2 23  GLUCOSE 131*  BUN 22  CREATININE 1.15  CALCIUM 9.9   LFT Recent Labs    09/13/20 1738  PROT 7.7  ALBUMIN 4.5  AST 26  ALT 42  ALKPHOS 37*  BILITOT 1.4*   PT/INR No results for input(s): LABPROT, INR in  the last 72 hours.  STUDIES: CT ABDOMEN PELVIS W CONTRAST  Result Date: 09/13/2020 CLINICAL DATA:  Periumbilical pain, history of Crohn disease, palpable umbilical mass EXAM: CT ABDOMEN AND PELVIS WITH CONTRAST TECHNIQUE: Multidetector CT imaging of the abdomen and pelvis was performed using the standard protocol following bolus administration of intravenous contrast. CONTRAST:  168mL OMNIPAQUE IOHEXOL 300 MG/ML  SOLN COMPARISON:  01/24/2019 FINDINGS: Lower chest: No acute pleural or parenchymal lung disease. Hepatobiliary: Diffuse hepatic steatosis. No focal liver abnormality. Gallbladder surgically absent. Pancreas: Unremarkable. No pancreatic ductal dilatation or surrounding inflammatory changes. Spleen: Normal in size without focal abnormality. Adrenals/Urinary Tract: Stable bilateral renal cortical atrophy and bilateral renal cortical cysts. Nonobstructing calculi within the lower pole left kidney measure up to 2.4 cm in size. No obstructive uropathy  within either kidney. The bladder is minimally distended without focal abnormality. The adrenals are normal. Stomach/Bowel: There is mild dilation of the mid to distal small bowel measuring up to 3.2 cm in diameter. There is no abrupt transition point, with the distal small bowel distension extending to the ileocolic anastomosis in the right lower quadrant. Favor early obstruction. There is no bowel wall thickening or inflammatory change. Vascular/Lymphatic: Aortic atherosclerosis. No enlarged abdominal or pelvic lymph nodes. Reproductive: Prostate is unremarkable. Other: No free fluid or free gas. Fat containing supraumbilical ventral hernia, with abdominal wall defect measuring up to 1.2 cm. No bowel herniation. Musculoskeletal: No acute or destructive bony lesions. Reconstructed images demonstrate no additional findings. IMPRESSION: 1. Mild dilation of the mid to distal small bowel, to the level of the ileocolic anastomosis. Findings favor early or intermittent small bowel obstruction. 2. Diffuse hepatic steatosis. 3. Stable nonobstructing left renal calculi. 4. Fat containing supraumbilical ventral hernia, corresponding to the patient's palpable abnormality. 5.  Aortic Atherosclerosis (ICD10-I70.0). Electronically Signed   By: Randa Ngo M.D.   On: 09/13/2020 18:35      Impression / Plan:   Isaiah Walker is a 75 y.o. y/o male has been admitted with appears to be partial small bowel obstruction.  The patient's history goes back over 20 years when he was admitted with a bowel obstruction and underwent ileocolonic resection with anastomosis by Dr. Bary Castilla.  Subsequently there has been no definitive evidence of Crohn's disease although the diagnosis from 20 years back was suggestive of Crohn's for the last office note with Dr. Marius Ditch.  In 2020 the patient was admitted again with small bowel obstruction and was found to have subsequently a stricture at the ileocolonic anastomosis.  It is unclear if the  stricture is due to Crohn's disease or a surgical anastomotic stricture.  It was dilated to 15 mm previously.  A subsequent colonoscopy did not show clear evidence of Crohn's disease based on biopsy results.  This admission it is unclear if there is worsening of the stricture which is most of the small bowel obstruction which is very likely.  There has been no mention of active inflammation seen on the CAT scan which makes it less likely an exacerbation of Crohn's disease.  NSAIDs can cause the same problem.  Plan 1.  Agree with conservative management, keep n.p.o. IV fluids, avoid narcotics, he bilateral lites normal which will help with bowel motility.  When abdominal exam is benign consider starting on liquids when she is passing gas and having bowel movements.  2.  As an outpatient he should follow-up with Dr. Marius Ditch and the decision would need to be made if he has an anastomotic stricture that  needs resection which will provide a more definitive outcome.  With no active inflammation seen previously and none on colonoscopy presently it would make use of a biologic agent for Crohn's disease less indicated.  Probably would benefit from a repeat colonoscopy/CT angiogram as an outpatient  3.  At this point of time I do not see a strong reason to continue with IV steroids.  Definitely would recommend him not to use any NSAIDs as an outpatient.  4.  I suspect his diarrhea which started yesterday after the CAT scan is probably related to the contrast.  I expect it to resolve.  I will check a baseline CRP as well as a fecal calprotectin.   Thank you for involving me in the care of this patient.      LOS: 0 days   Isaiah Bellows, MD  09/14/2020, 11:09 AM

## 2020-09-15 ENCOUNTER — Inpatient Hospital Stay: Payer: Medicare HMO

## 2020-09-15 DIAGNOSIS — N400 Enlarged prostate without lower urinary tract symptoms: Secondary | ICD-10-CM

## 2020-09-15 NOTE — Progress Notes (Signed)
Patient ID: Isaiah Walker, male   DOB: 03/17/1946, 75 y.o.   MRN: [id number]     [facility] Day(s): 1.   Post op day(s):  Marland Kitchen   Interval History: Patient seen and examined, no acute events or new complaints overnight. Patient reports feeling a little bit better than yesterday.  He reported the diarrhea has improved a little bit.  He still has diarrhea.  He denies nausea or vomiting.  Vital signs in last 24 hours: [min-max] current  Temp:  [97.5 F (36.4 C)-97.9 F (36.6 C)] 97.6 F (36.4 C) (04/02 0412) Pulse Rate:  [66-72] 70 (04/02 0412) Resp:  [14-18] 14 (04/02 0412) BP: (112-141)/(67-82) 141/68 (04/02 0412) SpO2:  [91 %-97 %] 97 % (04/02 0412)     Height: 5' 5.98" (167.6 cm) Weight: 129.3 kg BMI (Calculated): 46.02   Physical Exam:  Constitutional: alert, cooperative and no distress  Respiratory: breathing non-labored at rest  Cardiovascular: regular rate and sinus rhythm  Gastrointestinal: soft, mild-tender, and mild-distended  Labs:  CBC Latest Ref Rng & Units 09/13/2020 01/11/2019 01/10/2019  WBC 4.0 - 10.5 K/uL 15.0(H) 8.6 17.2(H)  Hemoglobin 13.0 - 17.0 g/dL 16.2 15.3 15.7  Hematocrit 39.0 - 52.0 % 47.5 46.8 47.3  Platelets 150 - 400 K/uL 181 215 212   CMP Latest Ref Rng & Units 09/13/2020 01/14/2019 01/12/2019  Glucose 70 - 99 mg/dL 131(H) 111(H) 109(H)  BUN 8 - 23 mg/dL 22 24(H) 29(H)  Creatinine 0.61 - 1.24 mg/dL 1.15 0.96 0.85  Sodium 135 - 145 mmol/L 136 138 140  Potassium 3.5 - 5.1 mmol/L 4.6 3.7 3.9  Chloride 98 - 111 mmol/L 101 107 110  CO2 22 - 32 mmol/L 23 25 24   Calcium 8.9 - 10.3 mg/dL 9.9 8.2(L) 8.2(L)  Total Protein 6.5 - 8.1 g/dL 7.7 - -  Total Bilirubin 0.3 - 1.2 mg/dL 1.4(H) - -  Alkaline Phos 38 - 126 U/L 37(L) - -  AST 15 - 41 U/L 26 - -  ALT 0 - 44 U/L 42 - -    Imaging studies: I personally evaluated the x-ray.  This shows diffuse small bowel and large intestine mild dilation.   Assessment/Plan:  75 y.o. male with  enteritis/SBO, complicated by pertinent comorbidities including history of Crohn disease, morbid obesity, hypertension.  Today he has not been much improvement.  Patient with improved diarrhea but still with uncomfortable abdomen.  He is mild tender to palpation.  Physical exam is very difficult due to patient obesity.  He is not nauseous but does not have appetite.  The x-ray shows generalized bowel dilation but with more gas in the colon compared to the CT.  I will continue conservative management.  Okay to hold NGT at this moment since patient is not nauseous and having diarrhea.  If he feels more comfortable during the day, clear liquid diet can be considered.  Appreciate GI input and recommendations.  Agree to continue conservative management.  Arnold Long, MD

## 2020-09-15 NOTE — Progress Notes (Signed)
PROGRESS NOTE  Isaiah Walker ZOX:[id number] DOB: 1945/08/21 DOA: 09/13/2020 PCP: [name], MD   LOS: 1 day   Brief Narrative / Interim history: 75 year old male with history of HTN, BPH, obesity, Crohn's disease with history of ileocecal resection with ileocolonic anastomosis, complicated by stricture and prior history of small bowel obstruction requiring dilatation in August 2020 who came into the hospital with lower abdominal pain, sharp, crampy, no nausea or vomiting but profuse diarrhea.  CT scan on admission showed mild dilation of the mid to distal small bowel to the level of ileocolic anastomosis, possibly early or intermittent small bowel obstruction.  He had leukocytosis on admission of 15 K, UA was unremarkable C. difficile and GI pathogen panel were negative.  GI and general surgery were consulted  Subjective / 24h Interval events: Still has abdominal pain today.  Had more diarrhea overnight  Assessment & Plan: Principal Problem Crohn's disease with history of terminal ileocecal resection, history of stricture at anastomotic site requiring dilatation, concern for pSBO -His enteritis appears to be noninfectious, C. difficile and GI pathogen panels negative.  He has a history of Crohn's disease, empirically started on Solu-Medrol on admission, awaiting GI input, appreciate consultation -General surgery, GI following.  For now continue conservative management, defer diet/further management to consultants  Active Problems Essential hypertension -continue home medications, blood pressure acceptable  BPH -continue home medications  Obesity, class III-he would benefit from weight loss  Scheduled Meds: . allopurinol  300 mg Oral Daily  . amLODipine  2.5 mg Oral Daily  . enoxaparin (LOVENOX) injection  0.5 mg/kg Subcutaneous Q24H  . finasteride  5 mg Oral Daily  . losartan  100 mg Oral Daily  . metoprolol tartrate  50 mg Oral BID  . oxybutynin  5 mg Oral q morning  . traZODone   150 mg Oral QHS   Continuous Infusions: . lactated ringers 100 mL/hr at 09/15/20 0452   PRN Meds:.acetaminophen **OR** acetaminophen, alum & mag hydroxide-simeth, labetalol, morphine injection, ondansetron (ZOFRAN) IV  Diet Orders (From admission, onward)    Start     Ordered   09/13/20 1941  Diet NPO time specified Except for: Sips with Meds, Ice Chips  Diet effective now       Question Answer Comment  Except for Sips with Meds   Except for Ice Chips      09/13/20 1940          DVT prophylaxis:      Code Status: Full Code  Family Communication: no family at bedside   Status is: Observation  The patient will require care spanning > 2 midnights and should be moved to inpatient because: Inpatient level of care appropriate due to severity of illness  Dispo: The patient is from: Home              Anticipated d/c is to: Home              Patient currently is not medically stable to d/c.   Difficult to place patient No   Level of care: Med-Surg  Consultants:  General surgery  GI  Procedures:  none  Microbiology  C diff, GI pathogen panel - negative  Antimicrobials: none    Objective: Vitals:   09/14/20 1500 09/14/20 1956 09/15/20 0412 09/15/20 1015  BP: 112/72 140/67 (!) 141/68 (!) 150/71  Pulse: 72 66 70 65  Resp: 18 18 14 16   Temp: 97.7 F (36.5 C) (!) 97.5 F (36.4 C) 97.6 F (36.4  C) (!) 97.5 F (36.4 C)  TempSrc: Oral   Oral  SpO2: 94% 97% 97% 94%  Weight:      Height:        Intake/Output Summary (Last 24 hours) at 09/15/2020 1134 Last data filed at 09/14/2020 2006 Gross per 24 hour  Intake 1625.55 ml  Output --  Net 1625.55 ml   Filed Weights   09/13/20 1630  Weight: 129.3 kg    Examination:  Constitutional: nad Eyes: no icterus  ENMT: mmm Neck: normal, supple Respiratory: cta biL, no wheezing Cardiovascular: rrr, no mrg, no edema Abdomen: mildly distended, tender to palpation throughout but no guarding or  rebound Musculoskeletal: no clubbing / cyanosis.  Skin: No rashes seen Neurologic: No focal deficits  Data Reviewed: I have independently reviewed following labs and imaging studies   CBC: Recent Labs  Lab 09/13/20 1738  WBC 15.0*  NEUTROABS 12.9*  HGB 16.2  HCT 47.5  MCV 87.0  PLT 195   Basic Metabolic Panel: Recent Labs  Lab 09/13/20 1738  NA 136  K 4.6  CL 101  CO2 23  GLUCOSE 131*  BUN 22  CREATININE 1.15  CALCIUM 9.9   Liver Function Tests: Recent Labs  Lab 09/13/20 1738  AST 26  ALT 42  ALKPHOS 37*  BILITOT 1.4*  PROT 7.7  ALBUMIN 4.5   Coagulation Profile: No results for input(s): INR, PROTIME in the last 168 hours. HbA1C: No results for input(s): HGBA1C in the last 72 hours. CBG: No results for input(s): GLUCAP in the last 168 hours.  Recent Results (from the past 240 hour(s))  SARS CORONAVIRUS 2 (TAT 6-24 HRS) Nasopharyngeal Nasopharyngeal Swab     Status: None   Collection Time: 09/13/20  7:37 PM   Specimen: Nasopharyngeal Swab  Result Value Ref Range Status   SARS Coronavirus 2 NEGATIVE NEGATIVE Final    Comment: (NOTE) SARS-CoV-2 target nucleic acids are NOT DETECTED.  The SARS-CoV-2 RNA is generally detectable in upper and lower respiratory specimens during the acute phase of infection. Negative results do not preclude SARS-CoV-2 infection, do not rule out co-infections with other pathogens, and should not be used as the sole basis for treatment or other patient management decisions. Negative results must be combined with clinical observations, patient history, and epidemiological information. The expected result is Negative.  Fact Sheet for Patients: SugarRoll.be  Fact Sheet for Healthcare Providers: https://www.woods-mathews.com/  This test is not yet approved or cleared by the [place] FDA and  has been authorized for detection and/or diagnosis of SARS-CoV-2 by FDA under an  Emergency Use Authorization (EUA). This EUA will remain  in effect (meaning this test can be used) for the duration of the COVID-19 declaration under Se ction 564(b)(1) of the Act, 21 U.S.C. section 360bbb-3(b)(1), unless the authorization is terminated or revoked sooner.  Performed at [facility], [place] [address], [place], [place] [address]   C Difficile Quick Screen w PCR reflex     Status: None   Collection Time: 09/14/20  1:28 AM   Specimen: Stool  Result Value Ref Range Status   C Diff antigen NEGATIVE NEGATIVE Final   C Diff toxin NEGATIVE NEGATIVE Final   C Diff interpretation No C. difficile detected.  Final    Comment: Performed at [facility], [address] [place], [place] [address]  Gastrointestinal Panel by PCR , Stool     Status: None   Collection Time: 09/14/20  1:28 AM   Specimen: Stool  Result Value Ref Range Status   Campylobacter species NOT DETECTED NOT DETECTED Final   Plesimonas shigelloides NOT DETECTED NOT DETECTED Final   Salmonella species NOT DETECTED NOT DETECTED Final   Yersinia enterocolitica NOT DETECTED NOT DETECTED Final   Vibrio species NOT DETECTED NOT DETECTED Final   Vibrio cholerae NOT DETECTED NOT DETECTED Final   Enteroaggregative E coli (EAEC) NOT DETECTED NOT DETECTED Final   Enteropathogenic E coli (EPEC) NOT DETECTED NOT DETECTED Final   Enterotoxigenic E coli (ETEC) NOT DETECTED NOT DETECTED Final   Shiga like toxin producing E coli (STEC) NOT DETECTED NOT DETECTED Final   Shigella/Enteroinvasive E coli (EIEC) NOT DETECTED NOT DETECTED Final   Cryptosporidium NOT DETECTED NOT DETECTED Final   Cyclospora cayetanensis NOT DETECTED NOT DETECTED Final   Entamoeba histolytica NOT DETECTED NOT DETECTED Final   Giardia lamblia NOT DETECTED NOT DETECTED Final   Adenovirus F40/41 NOT DETECTED NOT DETECTED Final   Astrovirus NOT DETECTED NOT DETECTED Final   Norovirus GI/GII NOT DETECTED NOT DETECTED Final   Rotavirus A NOT  DETECTED NOT DETECTED Final   Sapovirus (I, II, IV, and V) NOT DETECTED NOT DETECTED Final    Comment: Performed at [facility], [address], [place], [place] [address]     Radiology Studies: DG Abd 1 View  Result Date: 09/15/2020 CLINICAL DATA:  Abdominal pain and distension. Small-bowel obstruction. Crohn's disease. EXAM: ABDOMEN - 1 VIEW COMPARISON:  08/04/2018, CT abdomen/pelvis 09/13/2020 FINDINGS: Examination demonstrates multiple air-filled dilated small bowel loops measuring up to 7 cm in diameter. Findings are worse compared to the recent CT scan. Air is present throughout the colon. No free peritoneal air. Surgical clips over the right upper quadrant. Stable left nephrolithiasis. Remainder of the exam is unchanged. IMPRESSION: 1. Worsening small bowel obstruction. 2. Stable left nephrolithiasis. Electronically Signed   By: Marin Olp M.D.   On: 09/15/2020 09:43    Marzetta Board, MD, PhD [facility]  Between 7 am - 7 pm I am available, please contact me via Amion or Securechat  Between 7 pm - 7 am I am not available, please contact night coverage MD/APP via Amion

## 2020-09-16 LAB — COMPREHENSIVE METABOLIC PANEL
ALT: 42 U/L (ref 0–44)
AST: 19 U/L (ref 15–41)
Albumin: 3.4 g/dL — ABNORMAL LOW (ref 3.5–5.0)
Alkaline Phosphatase: 26 U/L — ABNORMAL LOW (ref 38–126)
Anion gap: 8 (ref 5–15)
BUN: 22 mg/dL (ref 8–23)
CO2: 24 mmol/L (ref 22–32)
Calcium: 8.4 mg/dL — ABNORMAL LOW (ref 8.9–10.3)
Chloride: 104 mmol/L (ref 98–111)
Creatinine, Ser: 1 mg/dL (ref 0.61–1.24)
GFR, Estimated: >60 mL/min (ref >60)
Glucose, Bld: 87 mg/dL (ref 70–99)
Potassium: 3.7 mmol/L (ref 3.5–5.1)
Sodium: 136 mmol/L (ref 135–145)
Total Bilirubin: 1 mg/dL (ref 0.3–1.2)
Total Protein: 6 g/dL — ABNORMAL LOW (ref 6.5–8.1)

## 2020-09-16 LAB — CBC
HCT: 40.9 % (ref 39.0–52.0)
Hemoglobin: 13.8 g/dL (ref 13.0–17.0)
MCH: 30.1 pg (ref 26.0–34.0)
MCHC: 33.7 g/dL (ref 30.0–36.0)
MCV: 89.3 fL (ref 80.0–100.0)
Platelets: 154 10*3/uL (ref 150–400)
RBC: 4.58 MIL/uL (ref 4.22–5.81)
RDW: 13.2 % (ref 11.5–15.5)
WBC: 5.9 10*3/uL (ref 4.0–10.5)
nRBC: 0 % (ref 0.0–0.2)

## 2020-09-16 NOTE — Progress Notes (Signed)
Subjective:  CC: Isaiah Walker is a 75 y.o. male  Hospital stay day 2,   SBO  HPI: No acute issues overnight.  Patient tolerating clears.  One bowel movement recorded.  ROS:  General: Denies weight loss, weight gain, fatigue, fevers, chills, and night sweats. Heart: Denies chest pain, palpitations, racing heart, irregular heartbeat, leg pain or swelling, and decreased activity tolerance. Respiratory: Denies breathing difficulty, shortness of breath, wheezing, cough, and sputum. GI: Denies change in appetite, heartburn, nausea, vomiting, constipation, diarrhea, and blood in stool. GU: Denies difficulty urinating, pain with urinating, urgency, frequency, blood in urine.   Objective:   Temp:  [97.4 F (36.3 C)-98.2 F (36.8 C)] 97.6 F (36.4 C) (04/03 1208) Pulse Rate:  [59-68] 59 (04/03 1208) Resp:  [16-20] 20 (04/03 1208) BP: (140-151)/(61-71) 141/61 (04/03 1208) SpO2:  [91 %-97 %] 91 % (04/03 1208)     Height: 5' 5.98" (167.6 cm) Weight: 129.3 kg BMI (Calculated): 46.02   Intake/Output this shift:   Intake/Output Summary (Last 24 hours) at 09/16/2020 1639 Last data filed at 09/16/2020 1636 Gross per 24 hour  Intake 4950.28 ml  Output 0 ml  Net 4950.28 ml    Constitutional :  alert, cooperative, appears stated age and no distress  Respiratory:  clear to auscultation bilaterally  Cardiovascular:  regular rate and rhythm  Gastrointestinal: Soft, no guarding, there has persistent focal tenderness in right lower quadrant.  No bloating  Skin: Cool and moist.   Psychiatric: Normal affect, non-agitated, not confused       LABS:  CMP Latest Ref Rng & Units 09/16/2020 09/13/2020 01/14/2019  Glucose 70 - 99 mg/dL 87 131(H) 111(H)  BUN 8 - 23 mg/dL 22 22 24(H)  Creatinine 0.61 - 1.24 mg/dL 1.00 1.15 0.96  Sodium 135 - 145 mmol/L 136 136 138  Potassium 3.5 - 5.1 mmol/L 3.7 4.6 3.7  Chloride 98 - 111 mmol/L 104 101 107  CO2 22 - 32 mmol/L 24 23 25   Calcium 8.9 - 10.3 mg/dL 8.4(L) 9.9  8.2(L)  Total Protein 6.5 - 8.1 g/dL 6.0(L) 7.7 -  Total Bilirubin 0.3 - 1.2 mg/dL 1.0 1.4(H) -  Alkaline Phos 38 - 126 U/L 26(L) 37(L) -  AST 15 - 41 U/L 19 26 -  ALT 0 - 44 U/L 42 42 -   CBC Latest Ref Rng & Units 09/16/2020 09/13/2020 01/11/2019  WBC 4.0 - 10.5 K/uL 5.9 15.0(H) 8.6  Hemoglobin 13.0 - 17.0 g/dL 13.8 16.2 15.3  Hematocrit 39.0 - 52.0 % 40.9 47.5 46.8  Platelets 150 - 400 K/uL 154 181 215    RADS: n/a Assessment:   SBO.  Bowel movement recorded with tolerating clears at this time.  However, due to the persistent pain in the right lower quadrant.  Recommend continuing clear liquid diet for another day and will continue to monitor.

## 2020-09-16 NOTE — Progress Notes (Signed)
PROGRESS NOTE  Isaiah Walker JSH:[id number] DOB: Jul 24, 1945 DOA: 09/13/2020 PCP: [name], MD   LOS: 2 days   Brief Narrative / Interim history: 75 year old male with history of HTN, BPH, obesity, Crohn's disease with history of ileocecal resection with ileocolonic anastomosis, complicated by stricture and prior history of small bowel obstruction requiring dilatation in August 2020 who came into the hospital with lower abdominal pain, sharp, crampy, no nausea or vomiting but profuse diarrhea.  CT scan on admission showed mild dilation of the mid to distal small bowel to the level of ileocolic anastomosis, possibly early or intermittent small bowel obstruction.  He had leukocytosis on admission of 15 K, UA was unremarkable C. difficile and GI pathogen panel were negative.  GI and general surgery were consulted  Subjective / 24h Interval events: Feels like his diarrhea is slowing down, abdominal pain is less as well.  Overall he feels much better.  He wants to eat something and shower, he is hungry.  No nausea or vomiting.  Assessment & Plan: Principal Problem Crohn's disease with history of terminal ileocecal resection, history of stricture at anastomotic site requiring dilatation, concern for pSBO -His enteritis appears to be noninfectious, C. difficile and GI pathogen panels negative.  He has a history of Crohn's disease, empirically started on Solu-Medrol on admission, awaiting GI input, appreciate consultation -General surgery, GI following.  Continue conservative management, abdominal x-ray yesterday showed worsening small bowel obstruction but clinically does not correlate, he has no nausea, no vomiting, is having bowel movements and abdominal pain is decreasing.  Will trial clear liquids today  Active Problems Essential hypertension -continue home medications, blood pressure acceptable  BPH -continue home medications  Obesity, class III-he would benefit from weight loss  Scheduled  Meds: . allopurinol  300 mg Oral Daily  . amLODipine  2.5 mg Oral Daily  . enoxaparin (LOVENOX) injection  0.5 mg/kg Subcutaneous Q24H  . finasteride  5 mg Oral Daily  . losartan  100 mg Oral Daily  . metoprolol tartrate  50 mg Oral BID  . oxybutynin  5 mg Oral q morning  . traZODone  150 mg Oral QHS   Continuous Infusions: . lactated ringers 100 mL/hr at 09/16/20 0753   PRN Meds:.acetaminophen **OR** acetaminophen, alum & mag hydroxide-simeth, labetalol, morphine injection, ondansetron (ZOFRAN) IV  Diet Orders (From admission, onward)    Start     Ordered   09/16/20 0845  Diet clear liquid Room service appropriate? Yes; Fluid consistency: Thin  Diet effective now       Question Answer Comment  Room service appropriate? Yes   Fluid consistency: Thin      09/16/20 0844          DVT prophylaxis:      Code Status: Full Code  Family Communication: no family at bedside   Status is: Observation  The patient will require care spanning > 2 midnights and should be moved to inpatient because: Inpatient level of care appropriate due to severity of illness  Dispo: The patient is from: Home              Anticipated d/c is to: Home              Patient currently is not medically stable to d/c.   Difficult to place patient No   Level of care: Med-Surg  Consultants:  General surgery  GI  Procedures:  none  Microbiology  C diff, GI pathogen panel - negative  Antimicrobials: none  Objective: Vitals:   09/15/20 1913 09/15/20 2214 09/16/20 0423 09/16/20 0757  BP: (!) 144/65 140/66 (!) 151/71 (!) 145/70  Pulse: 67 62 68 64  Resp: 20 16 20 16   Temp: 97.8 F (36.6 C) 98.2 F (36.8 C) 97.9 F (36.6 C) (!) 97.4 F (36.3 C)  TempSrc: Oral Oral Oral Oral  SpO2: 93% 93% 94% 97%  Weight:      Height:        Intake/Output Summary (Last 24 hours) at 09/16/2020 1027 Last data filed at 09/16/2020 2831 Gross per 24 hour  Intake --  Output 0 ml  Net 0 ml   Filed  Weights   09/13/20 1630  Weight: 129.3 kg    Examination:  Constitutional: He is in no distress Eyes: No scleral icterus ENMT: Moist mucous membranes Neck: normal, supple Respiratory: Lungs are clear bilaterally, no wheezing Cardiovascular: Regular rate and rhythm, no murmur, no peripheral edema Abdomen: mildly distended, minimal tenderness today, improved compared to yesterday Musculoskeletal: no clubbing / cyanosis.  Skin: No rashes seen Neurologic: No focal deficits  Data Reviewed: I have independently reviewed following labs and imaging studies   CBC: Recent Labs  Lab 09/13/20 1738 09/16/20 0548  WBC 15.0* 5.9  NEUTROABS 12.9*  --   HGB 16.2 13.8  HCT 47.5 40.9  MCV 87.0 89.3  PLT 181 517   Basic Metabolic Panel: Recent Labs  Lab 09/13/20 1738 09/16/20 0548  NA 136 136  K 4.6 3.7  CL 101 104  CO2 23 24  GLUCOSE 131* 87  BUN 22 22  CREATININE 1.15 1.00  CALCIUM 9.9 8.4*   Liver Function Tests: Recent Labs  Lab 09/13/20 1738 09/16/20 0548  AST 26 19  ALT 42 42  ALKPHOS 37* 26*  BILITOT 1.4* 1.0  PROT 7.7 6.0*  ALBUMIN 4.5 3.4*   Coagulation Profile: No results for input(s): INR, PROTIME in the last 168 hours. HbA1C: No results for input(s): HGBA1C in the last 72 hours. CBG: No results for input(s): GLUCAP in the last 168 hours.  Recent Results (from the past 240 hour(s))  SARS CORONAVIRUS 2 (TAT 6-24 HRS) Nasopharyngeal Nasopharyngeal Swab     Status: None   Collection Time: 09/13/20  7:37 PM   Specimen: Nasopharyngeal Swab  Result Value Ref Range Status   SARS Coronavirus 2 NEGATIVE NEGATIVE Final    Comment: (NOTE) SARS-CoV-2 target nucleic acids are NOT DETECTED.  The SARS-CoV-2 RNA is generally detectable in upper and lower respiratory specimens during the acute phase of infection. Negative results do not preclude SARS-CoV-2 infection, do not rule out co-infections with other pathogens, and should not be used as the sole basis for  treatment or other patient management decisions. Negative results must be combined with clinical observations, patient history, and epidemiological information. The expected result is Negative.  Fact Sheet for Patients: SugarRoll.be  Fact Sheet for Healthcare Providers: https://www.woods-mathews.com/  This test is not yet approved or cleared by the [place] FDA and  has been authorized for detection and/or diagnosis of SARS-CoV-2 by FDA under an Emergency Use Authorization (EUA). This EUA will remain  in effect (meaning this test can be used) for the duration of the COVID-19 declaration under Se ction 564(b)(1) of the Act, 21 U.S.C. section 360bbb-3(b)(1), unless the authorization is terminated or revoked sooner.  Performed at [facility], [place] [address] [place], [place] [address]   C Difficile Quick Screen w PCR reflex     Status: None  Collection Time: 09/14/20  1:28 AM   Specimen: Stool  Result Value Ref Range Status   C Diff antigen NEGATIVE NEGATIVE Final   C Diff toxin NEGATIVE NEGATIVE Final   C Diff interpretation No C. difficile detected.  Final    Comment: Performed at [facility], [address] [place], [place] [address]  Gastrointestinal Panel by PCR , Stool     Status: None   Collection Time: 09/14/20  1:28 AM   Specimen: Stool  Result Value Ref Range Status   Campylobacter species NOT DETECTED NOT DETECTED Final   Plesimonas shigelloides NOT DETECTED NOT DETECTED Final   Salmonella species NOT DETECTED NOT DETECTED Final   Yersinia enterocolitica NOT DETECTED NOT DETECTED Final   Vibrio species NOT DETECTED NOT DETECTED Final   Vibrio cholerae NOT DETECTED NOT DETECTED Final   Enteroaggregative E coli (EAEC) NOT DETECTED NOT DETECTED Final   Enteropathogenic E coli (EPEC) NOT DETECTED NOT DETECTED Final   Enterotoxigenic E coli (ETEC) NOT DETECTED NOT DETECTED Final   Shiga like toxin producing  E coli (STEC) NOT DETECTED NOT DETECTED Final   Shigella/Enteroinvasive E coli (EIEC) NOT DETECTED NOT DETECTED Final   Cryptosporidium NOT DETECTED NOT DETECTED Final   Cyclospora cayetanensis NOT DETECTED NOT DETECTED Final   Entamoeba histolytica NOT DETECTED NOT DETECTED Final   Giardia lamblia NOT DETECTED NOT DETECTED Final   Adenovirus F40/41 NOT DETECTED NOT DETECTED Final   Astrovirus NOT DETECTED NOT DETECTED Final   Norovirus GI/GII NOT DETECTED NOT DETECTED Final   Rotavirus A NOT DETECTED NOT DETECTED Final   Sapovirus (I, II, IV, and V) NOT DETECTED NOT DETECTED Final    Comment: Performed at [facility], [address], [place], [place] [address]     Radiology Studies: No results found.  Marzetta Board, MD, PhD [facility]  Between 7 am - 7 pm I am available, please contact me via Amion or Securechat  Between 7 pm - 7 am I am not available, please contact night coverage MD/APP via Amion

## 2020-09-17 LAB — CALPROTECTIN, FECAL: Calprotectin, Fecal: 391 ug/g — ABNORMAL HIGH (ref 0–120)

## 2020-09-17 NOTE — Progress Notes (Signed)
Order received to discontinue telemetry 

## 2020-09-17 NOTE — Care Management Important Message (Signed)
Important Message  Patient Details  Name: Isaiah Walker MRN: [id number] Date of Birth: [birth year]   Medicare Important Message Given:  Yes     Dannette Barbara 09/17/2020, 12:10 PM

## 2020-09-17 NOTE — Progress Notes (Signed)
Subjective:  CC: Isaiah Walker is a 75 y.o. male  Hospital stay day 3,   SBO  HPI: No acute issues overnight.  Patient tolerating full liquid.  Another BM this am.   ROS:  General: Denies weight loss, weight gain, fatigue, fevers, chills, and night sweats. Heart: Denies chest pain, palpitations, racing heart, irregular heartbeat, leg pain or swelling, and decreased activity tolerance. Respiratory: Denies breathing difficulty, shortness of breath, wheezing, cough, and sputum. GI: Denies change in appetite, heartburn, nausea, vomiting, constipation, diarrhea, and blood in stool. GU: Denies difficulty urinating, pain with urinating, urgency, frequency, blood in urine.   Objective:   Temp:  [97.4 F (36.3 C)-98.2 F (36.8 C)] 97.6 F (36.4 C) (04/04 1139) Pulse Rate:  [63-72] 68 (04/04 1139) Resp:  [16-18] 18 (04/04 1139) BP: (124-161)/(66-73) 126/68 (04/04 1139) SpO2:  [92 %-97 %] 95 % (04/04 1139)     Height: 5' 5.98" (167.6 cm) Weight: 129.3 kg BMI (Calculated): 46.02   Intake/Output this shift:   Intake/Output Summary (Last 24 hours) at 09/17/2020 1337 Last data filed at 09/17/2020 1037 Gross per 24 hour  Intake 5270.28 ml  Output --  Net 5270.28 ml    Constitutional :  alert, cooperative, appears stated age and no distress  Respiratory:  clear to auscultation bilaterally  Cardiovascular:  regular rate and rhythm  Gastrointestinal: Soft, no guarding, TTP has migrated to epigastric today, no RLQ pain.  No bloating  Skin: Cool and moist.   Psychiatric: Normal affect, non-agitated, not confused       LABS:  CMP Latest Ref Rng & Units 09/16/2020 09/13/2020 01/14/2019  Glucose 70 - 99 mg/dL 87 131(H) 111(H)  BUN 8 - 23 mg/dL 22 22 24(H)  Creatinine 0.61 - 1.24 mg/dL 1.00 1.15 0.96  Sodium 135 - 145 mmol/L 136 136 138  Potassium 3.5 - 5.1 mmol/L 3.7 4.6 3.7  Chloride 98 - 111 mmol/L 104 101 107  CO2 22 - 32 mmol/L 24 23 25   Calcium 8.9 - 10.3 mg/dL 8.4(L) 9.9 8.2(L)  Total  Protein 6.5 - 8.1 g/dL 6.0(L) 7.7 -  Total Bilirubin 0.3 - 1.2 mg/dL 1.0 1.4(H) -  Alkaline Phos 38 - 126 U/L 26(L) 37(L) -  AST 15 - 41 U/L 19 26 -  ALT 0 - 44 U/L 42 42 -   CBC Latest Ref Rng & Units 09/16/2020 09/13/2020 01/11/2019  WBC 4.0 - 10.5 K/uL 5.9 15.0(H) 8.6  Hemoglobin 13.0 - 17.0 g/dL 13.8 16.2 15.3  Hematocrit 39.0 - 52.0 % 40.9 47.5 46.8  Platelets 150 - 400 K/uL 154 181 215    RADS: n/a Assessment:   SBO. Still complains of TTP, but has migrated to epigastric, could be gas type pain and not issue with obstruction at this point.  Will advance to regular diet, and if tolerates for rest of day, ok to d/c from my standpoint tomorrow. No f/u needed as outpt

## 2020-09-17 NOTE — Progress Notes (Signed)
PROGRESS NOTE  Isaiah Walker ZOX:[id number] DOB: [birth year] DOA: 09/13/2020 PCP: [name], MD   LOS: 3 days   Brief Narrative / Interim history: 75 year old male with history of HTN, BPH, obesity, Crohn's disease with history of ileocecal resection with ileocolonic anastomosis, complicated by stricture and prior history of small bowel obstruction requiring dilatation in August 2020 who came into the hospital with lower abdominal pain, sharp, crampy, no nausea or vomiting but profuse diarrhea.  CT scan on admission showed mild dilation of the mid to distal small bowel to the level of ileocolic anastomosis, possibly early or intermittent small bowel obstruction.  He had leukocytosis on admission of 15 K, UA was unremarkable C. difficile and GI pathogen panel were negative.  GI and general surgery were consulted  Subjective / 24h Interval events: Feeling better, one episode of loose stools this morning but no diarrhea overnight   Assessment & Plan: Principal Problem Crohn's disease with history of terminal ileocecal resection, history of stricture at anastomotic site requiring dilatation, concern for pSBO -His enteritis appears to be noninfectious, C. difficile and GI pathogen panels negative.  He has a history of Crohn's disease, empirically started on Solu-Medrol on admission, however it does not look like a flare up per GI, and his steroids have been stopped -General surgery following as well.  Continue conservative management, clinically improving, advance today to regular diet and if tolerated he can go home in am   Active Problems Essential hypertension -continue home medications, blood pressure acceptable  BPH -continue home medications  Obesity, class III-he would benefit from weight loss  Scheduled Meds: . allopurinol  300 mg Oral Daily  . amLODipine  2.5 mg Oral Daily  . enoxaparin (LOVENOX) injection  0.5 mg/kg Subcutaneous Q24H  . finasteride  5 mg Oral Daily  . losartan   100 mg Oral Daily  . metoprolol tartrate  50 mg Oral BID  . oxybutynin  5 mg Oral q morning  . traZODone  150 mg Oral QHS   Continuous Infusions:  PRN Meds:.acetaminophen **OR** acetaminophen, alum & mag hydroxide-simeth, labetalol, morphine injection, ondansetron (ZOFRAN) IV  Diet Orders (From admission, onward)    Start     Ordered   09/17/20 1338  Diet regular Room service appropriate? Yes; Fluid consistency: Thin  Diet effective now       Question Answer Comment  Room service appropriate? Yes   Fluid consistency: Thin      09/17/20 1337          DVT prophylaxis:      Code Status: Full Code  Family Communication: no family at bedside   Status is: Observation  The patient will require care spanning > 2 midnights and should be moved to inpatient because: Inpatient level of care appropriate due to severity of illness  Dispo: The patient is from: Home              Anticipated d/c is to: Home              Patient currently is not medically stable to d/c.   Difficult to place patient No   Level of care: Med-Surg  Consultants:  General surgery  GI  Procedures:  none  Microbiology  C diff, GI pathogen panel - negative  Antimicrobials: none    Objective: Vitals:   09/16/20 2337 09/17/20 0438 09/17/20 0754 09/17/20 1139  BP: 137/69 124/66 (!) 153/67 126/68  Pulse: 63 64 63 68  Resp: 18 16 18  18  Temp: 98.2 F (36.8 C) (!) 97.4 F (36.3 C) 97.6 F (36.4 C) 97.6 F (36.4 C)  TempSrc: Oral  Oral   SpO2: 92% 95% 96% 95%  Weight:      Height:        Intake/Output Summary (Last 24 hours) at 09/17/2020 1352 Last data filed at 09/17/2020 1351 Gross per 24 hour  Intake 5690.28 ml  Output --  Net 5690.28 ml   Filed Weights   09/13/20 1630  Weight: 129.3 kg    Examination:  Constitutional: NAD Eyes: no icterus ENMT: mmm Neck: normal, supple Respiratory: cta biL, no wheezing Cardiovascular: rrr, no mrg, no edema  Abdomen: mild distention, mild  epigastric pain, no guarding or rebound Musculoskeletal: no clubbing / cyanosis.  Skin: no rashes Neurologic: non focal   Data Reviewed: I have independently reviewed following labs and imaging studies   CBC: Recent Labs  Lab 09/13/20 1738 09/16/20 0548  WBC 15.0* 5.9  NEUTROABS 12.9*  --   HGB 16.2 13.8  HCT 47.5 40.9  MCV 87.0 89.3  PLT 181 606   Basic Metabolic Panel: Recent Labs  Lab 09/13/20 1738 09/16/20 0548  NA 136 136  K 4.6 3.7  CL 101 104  CO2 23 24  GLUCOSE 131* 87  BUN 22 22  CREATININE 1.15 1.00  CALCIUM 9.9 8.4*   Liver Function Tests: Recent Labs  Lab 09/13/20 1738 09/16/20 0548  AST 26 19  ALT 42 42  ALKPHOS 37* 26*  BILITOT 1.4* 1.0  PROT 7.7 6.0*  ALBUMIN 4.5 3.4*   Coagulation Profile: No results for input(s): INR, PROTIME in the last 168 hours. HbA1C: No results for input(s): HGBA1C in the last 72 hours. CBG: No results for input(s): GLUCAP in the last 168 hours.  Recent Results (from the past 240 hour(s))  SARS CORONAVIRUS 2 (TAT 6-24 HRS) Nasopharyngeal Nasopharyngeal Swab     Status: None   Collection Time: 09/13/20  7:37 PM   Specimen: Nasopharyngeal Swab  Result Value Ref Range Status   SARS Coronavirus 2 NEGATIVE NEGATIVE Final    Comment: (NOTE) SARS-CoV-2 target nucleic acids are NOT DETECTED.  The SARS-CoV-2 RNA is generally detectable in upper and lower respiratory specimens during the acute phase of infection. Negative results do not preclude SARS-CoV-2 infection, do not rule out co-infections with other pathogens, and should not be used as the sole basis for treatment or other patient management decisions. Negative results must be combined with clinical observations, patient history, and epidemiological information. The expected result is Negative.  Fact Sheet for Patients: SugarRoll.be  Fact Sheet for Healthcare Providers: https://www.woods-mathews.com/  This test is  not yet approved or cleared by the [place] FDA and  has been authorized for detection and/or diagnosis of SARS-CoV-2 by FDA under an Emergency Use Authorization (EUA). This EUA will remain  in effect (meaning this test can be used) for the duration of the COVID-19 declaration under Se ction 564(b)(1) of the Act, 21 U.S.C. section 360bbb-3(b)(1), unless the authorization is terminated or revoked sooner.  Performed at [facility], [place] [address], [place], [place] [address]   C Difficile Quick Screen w PCR reflex     Status: None   Collection Time: 09/14/20  1:28 AM   Specimen: Stool  Result Value Ref Range Status   C Diff antigen NEGATIVE NEGATIVE Final   C Diff toxin NEGATIVE NEGATIVE Final   C Diff interpretation No C. difficile detected.  Final    Comment:  Performed at [facility], [address] [place], [place] [address]  Gastrointestinal Panel by PCR , Stool     Status: None   Collection Time: 09/14/20  1:28 AM   Specimen: Stool  Result Value Ref Range Status   Campylobacter species NOT DETECTED NOT DETECTED Final   Plesimonas shigelloides NOT DETECTED NOT DETECTED Final   Salmonella species NOT DETECTED NOT DETECTED Final   Yersinia enterocolitica NOT DETECTED NOT DETECTED Final   Vibrio species NOT DETECTED NOT DETECTED Final   Vibrio cholerae NOT DETECTED NOT DETECTED Final   Enteroaggregative E coli (EAEC) NOT DETECTED NOT DETECTED Final   Enteropathogenic E coli (EPEC) NOT DETECTED NOT DETECTED Final   Enterotoxigenic E coli (ETEC) NOT DETECTED NOT DETECTED Final   Shiga like toxin producing E coli (STEC) NOT DETECTED NOT DETECTED Final   Shigella/Enteroinvasive E coli (EIEC) NOT DETECTED NOT DETECTED Final   Cryptosporidium NOT DETECTED NOT DETECTED Final   Cyclospora cayetanensis NOT DETECTED NOT DETECTED Final   Entamoeba histolytica NOT DETECTED NOT DETECTED Final   Giardia lamblia NOT DETECTED NOT DETECTED Final   Adenovirus F40/41 NOT  DETECTED NOT DETECTED Final   Astrovirus NOT DETECTED NOT DETECTED Final   Norovirus GI/GII NOT DETECTED NOT DETECTED Final   Rotavirus A NOT DETECTED NOT DETECTED Final   Sapovirus (I, II, IV, and V) NOT DETECTED NOT DETECTED Final    Comment: Performed at [facility], [address], [place], [place] [address]     Radiology Studies: No results found.  Marzetta Board, MD, PhD [facility]  Between 7 am - 7 pm I am available, please contact me via Amion or Securechat  Between 7 pm - 7 am I am not available, please contact night coverage MD/APP via Amion

## 2020-09-18 NOTE — Progress Notes (Signed)
Discharge instructions reviewed with patient .  Discussed diet and fluid restrictions - verbalized understanding.   IV removed and site benign.

## 2020-09-18 NOTE — Discharge Summary (Signed)
Physician Discharge Summary  CONG HIGHTOWER UXN:[id number] DOB: 08/30/1945 DOA: 09/13/2020  PCP: [name], MD  Admit date: 09/13/2020 Discharge date: 09/18/2020  Admitted From: home Disposition:  home  Recommendations for Outpatient Follow-up:  1. Follow up with PCP in 1-2 weeks 2. Please obtain BMP/CBC in one week  Home Health: none Equipment/Devices: none  Discharge Condition: stable CODE STATUS: Full code Diet recommendation: regular  HPI: Per admitting MD, Isaiah Walker is a 75 y.o. male with medical history significant for HTN, BPH, class III obesity, Crohn's disease with history of ileocecal resection with ileocolonic anastomosis, complicated by stricture and SBO requiring dilatation on 02/03/2019, who presents to the emergency room with a 1 day history of lower abdominal pain that awoke him from sleep at 5 AM in the morning.  He was previously in his usual state of health.  Pain has been sharp, crampy, waxing and waning with no aggravating or alleviating factors.  Associated with nausea but no vomiting.  He denies fever or chills and denies change in bowel habits or dysuria.  Denies cough or shortness of breath.  Hospital Course / Discharge diagnoses: Principal Problem Crohn's disease with history of terminal ileocecal resection, history of stricture at anastomotic site requiring dilatation, concern for pSBO -His enteritis appears to be noninfectious, C. difficile and GI pathogen panels negative.  He has a history of Crohn's disease, empirically started on Solu-Medrol on admission, however it does not look like a flare up per GI, and his steroids have been stopped. General surgery followed patient while hospitalized as well. He was treated conservatively with NPO, IVF. He gradually improved, his diarrhea improved and he is now able to tolerate a regular diet without nausea/vomiting or abdominal pain. He will be discharged home in stable condition with outpatient follow up.  Active  Problems Essential hypertension -continue home medications BPH -continue home medications Obesity, class III-he would benefit from weight loss  Sepsis ruled out   Discharge Instructions   Allergies as of 09/18/2020   No Known Allergies     Medication List    TAKE these medications   allopurinol 300 MG tablet Commonly known as: ZYLOPRIM Take 300 mg by mouth daily.   amLODipine 2.5 MG tablet Commonly known as: NORVASC Take 2.5 mg by mouth daily.   finasteride 5 MG tablet Commonly known as: PROSCAR Take 1 tablet (5 mg total) by mouth daily.   losartan 100 MG tablet Commonly known as: COZAAR TAKE 1 TABLET BY MOUTH EVERY DAY IN THE MORNING   meloxicam 15 MG tablet Commonly known as: MOBIC Take 15 mg by mouth daily as needed.   metoprolol tartrate 50 MG tablet Commonly known as: LOPRESSOR Take 50 mg by mouth 2 (two) times daily.   oxybutynin 5 MG tablet Commonly known as: DITROPAN Take 5 mg by mouth every morning.   traZODone 150 MG tablet Commonly known as: DESYREL Take 150 mg by mouth at bedtime.       Follow-up Information    [name], MD. Schedule an appointment as soon as possible for a visit in 1 week(s).   Specialty: Internal Medicine Contact information: [address] [place] [address] [phone]               Consultations:  General surgery   GI  Procedures/Studies:  DG Abd 1 View  Result Date: 09/15/2020 CLINICAL DATA:  Abdominal pain and distension. Small-bowel obstruction. Crohn's disease. EXAM: ABDOMEN - 1 VIEW COMPARISON:  08/04/2018, CT  abdomen/pelvis 09/13/2020 FINDINGS: Examination demonstrates multiple air-filled dilated small bowel loops measuring up to 7 cm in diameter. Findings are worse compared to the recent CT scan. Air is present throughout the colon. No free peritoneal air. Surgical clips over the right upper quadrant. Stable left nephrolithiasis. Remainder of the exam is unchanged. IMPRESSION: 1. Worsening  small bowel obstruction. 2. Stable left nephrolithiasis. Electronically Signed   By: Marin Olp M.D.   On: 09/15/2020 09:43   CT ABDOMEN PELVIS W CONTRAST  Result Date: 09/13/2020 CLINICAL DATA:  Periumbilical pain, history of Crohn disease, palpable umbilical mass EXAM: CT ABDOMEN AND PELVIS WITH CONTRAST TECHNIQUE: Multidetector CT imaging of the abdomen and pelvis was performed using the standard protocol following bolus administration of intravenous contrast. CONTRAST:  140mL OMNIPAQUE IOHEXOL 300 MG/ML  SOLN COMPARISON:  01/24/2019 FINDINGS: Lower chest: No acute pleural or parenchymal lung disease. Hepatobiliary: Diffuse hepatic steatosis. No focal liver abnormality. Gallbladder surgically absent. Pancreas: Unremarkable. No pancreatic ductal dilatation or surrounding inflammatory changes. Spleen: Normal in size without focal abnormality. Adrenals/Urinary Tract: Stable bilateral renal cortical atrophy and bilateral renal cortical cysts. Nonobstructing calculi within the lower pole left kidney measure up to 2.4 cm in size. No obstructive uropathy within either kidney. The bladder is minimally distended without focal abnormality. The adrenals are normal. Stomach/Bowel: There is mild dilation of the mid to distal small bowel measuring up to 3.2 cm in diameter. There is no abrupt transition point, with the distal small bowel distension extending to the ileocolic anastomosis in the right lower quadrant. Favor early obstruction. There is no bowel wall thickening or inflammatory change. Vascular/Lymphatic: Aortic atherosclerosis. No enlarged abdominal or pelvic lymph nodes. Reproductive: Prostate is unremarkable. Other: No free fluid or free gas. Fat containing supraumbilical ventral hernia, with abdominal wall defect measuring up to 1.2 cm. No bowel herniation. Musculoskeletal: No acute or destructive bony lesions. Reconstructed images demonstrate no additional findings. IMPRESSION: 1. Mild dilation of the mid  to distal small bowel, to the level of the ileocolic anastomosis. Findings favor early or intermittent small bowel obstruction. 2. Diffuse hepatic steatosis. 3. Stable nonobstructing left renal calculi. 4. Fat containing supraumbilical ventral hernia, corresponding to the patient's palpable abnormality. 5.  Aortic Atherosclerosis (ICD10-I70.0). Electronically Signed   By: Randa Ngo M.D.   On: 09/13/2020 18:35      Subjective: - no chest pain, shortness of breath, no abdominal pain, nausea or vomiting.   Discharge Exam: BP (!) 146/62   Pulse 66   Temp 97.9 F (36.6 C)   Resp 20   Ht 5' 5.98" (1.676 m)   Wt 129.3 kg   SpO2 97%   BMI 46.02 kg/m   General: Pt is alert, awake, not in acute distress Cardiovascular: RRR, S1/S2 +, no rubs, no gallops Respiratory: CTA bilaterally, no wheezing, no rhonchi Abdominal: Soft, NT, ND, bowel sounds + Extremities: no edema, no cyanosis    The results of significant diagnostics from this hospitalization (including imaging, microbiology, ancillary and laboratory) are listed below for reference.     Microbiology: Recent Results (from the past 240 hour(s))  SARS CORONAVIRUS 2 (TAT 6-24 HRS) Nasopharyngeal Nasopharyngeal Swab     Status: None   Collection Time: 09/13/20  7:37 PM   Specimen: Nasopharyngeal Swab  Result Value Ref Range Status   SARS Coronavirus 2 NEGATIVE NEGATIVE Final    Comment: (NOTE) SARS-CoV-2 target nucleic acids are NOT DETECTED.  The SARS-CoV-2 RNA is generally detectable in upper and lower respiratory specimens during the  acute phase of infection. Negative results do not preclude SARS-CoV-2 infection, do not rule out co-infections with other pathogens, and should not be used as the sole basis for treatment or other patient management decisions. Negative results must be combined with clinical observations, patient history, and epidemiological information. The expected result is Negative.  Fact Sheet for  Patients: SugarRoll.be  Fact Sheet for Healthcare Providers: https://www.woods-mathews.com/  This test is not yet approved or cleared by the [place] FDA and  has been authorized for detection and/or diagnosis of SARS-CoV-2 by FDA under an Emergency Use Authorization (EUA). This EUA will remain  in effect (meaning this test can be used) for the duration of the COVID-19 declaration under Se ction 564(b)(1) of the Act, 21 U.S.C. section 360bbb-3(b)(1), unless the authorization is terminated or revoked sooner.  Performed at [facility], [place] [address], [place], [place] [address]   C Difficile Quick Screen w PCR reflex     Status: None   Collection Time: 09/14/20  1:28 AM   Specimen: Stool  Result Value Ref Range Status   C Diff antigen NEGATIVE NEGATIVE Final   C Diff toxin NEGATIVE NEGATIVE Final   C Diff interpretation No C. difficile detected.  Final    Comment: Performed at [facility], [address] [place], [place] [address]  Gastrointestinal Panel by PCR , Stool     Status: None   Collection Time: 09/14/20  1:28 AM   Specimen: Stool  Result Value Ref Range Status   Campylobacter species NOT DETECTED NOT DETECTED Final   Plesimonas shigelloides NOT DETECTED NOT DETECTED Final   Salmonella species NOT DETECTED NOT DETECTED Final   Yersinia enterocolitica NOT DETECTED NOT DETECTED Final   Vibrio species NOT DETECTED NOT DETECTED Final   Vibrio cholerae NOT DETECTED NOT DETECTED Final   Enteroaggregative E coli (EAEC) NOT DETECTED NOT DETECTED Final   Enteropathogenic E coli (EPEC) NOT DETECTED NOT DETECTED Final   Enterotoxigenic E coli (ETEC) NOT DETECTED NOT DETECTED Final   Shiga like toxin producing E coli (STEC) NOT DETECTED NOT DETECTED Final   Shigella/Enteroinvasive E coli (EIEC) NOT DETECTED NOT DETECTED Final   Cryptosporidium NOT DETECTED NOT DETECTED Final   Cyclospora cayetanensis NOT DETECTED NOT  DETECTED Final   Entamoeba histolytica NOT DETECTED NOT DETECTED Final   Giardia lamblia NOT DETECTED NOT DETECTED Final   Adenovirus F40/41 NOT DETECTED NOT DETECTED Final   Astrovirus NOT DETECTED NOT DETECTED Final   Norovirus GI/GII NOT DETECTED NOT DETECTED Final   Rotavirus A NOT DETECTED NOT DETECTED Final   Sapovirus (I, II, IV, and V) NOT DETECTED NOT DETECTED Final    Comment: Performed at [facility], [address] [place], [place] [address]  Calprotectin, Fecal     Status: Abnormal   Collection Time: 09/14/20 11:27 AM   Specimen: Stool  Result Value Ref Range Status   Calprotectin, Fecal 391 (H) 0 - 120 ug/g Final    Comment: (NOTE) Concentration     Interpretation   Follow-Up <16 - 50 ug/g     Normal           None >50 -120 ug/g     Borderline       Re-evaluate in 4-6 weeks    >120 ug/g     Abnormal         Repeat as clinically  indicated Performed At: [facility] [place], [place] [id number] Rush Farmer MD QQ:[id number]      Labs: Basic Metabolic Panel: Recent Labs  Lab 09/13/20 1738 09/16/20 0548  NA 136 136  K 4.6 3.7  CL 101 104  CO2 23 24  GLUCOSE 131* 87  BUN 22 22  CREATININE 1.15 1.00  CALCIUM 9.9 8.4*   Liver Function Tests: Recent Labs  Lab 09/13/20 1738 09/16/20 0548  AST 26 19  ALT 42 42  ALKPHOS 37* 26*  BILITOT 1.4* 1.0  PROT 7.7 6.0*  ALBUMIN 4.5 3.4*   CBC: Recent Labs  Lab 09/13/20 1738 09/16/20 0548  WBC 15.0* 5.9  NEUTROABS 12.9*  --   HGB 16.2 13.8  HCT 47.5 40.9  MCV 87.0 89.3  PLT 181 154   CBG: No results for input(s): GLUCAP in the last 168 hours. Hgb A1c No results for input(s): HGBA1C in the last 72 hours. Lipid Profile No results for input(s): CHOL, HDL, LDLCALC, TRIG, CHOLHDL, LDLDIRECT in the last 72 hours. Thyroid function studies No results for input(s): TSH, T4TOTAL, T3FREE, THYROIDAB in the last 72 hours.  Invalid input(s):  FREET3 Urinalysis    Component Value Date/Time   COLORURINE YELLOW (A) 09/13/2020 1807   APPEARANCEUR CLEAR (A) 09/13/2020 1807   APPEARANCEUR Cloudy (A) 05/01/2017 0937   LABSPEC 1.019 09/13/2020 1807   PHURINE 5.0 09/13/2020 1807   GLUCOSEU NEGATIVE 09/13/2020 1807   HGBUR NEGATIVE 09/13/2020 1807   BILIRUBINUR NEGATIVE 09/13/2020 1807   BILIRUBINUR Negative 05/01/2017 0937   KETONESUR 5 (A) 09/13/2020 1807   PROTEINUR NEGATIVE 09/13/2020 1807   NITRITE NEGATIVE 09/13/2020 1807   LEUKOCYTESUR TRACE (A) 09/13/2020 1807    FURTHER DISCHARGE INSTRUCTIONS:   Get Medicines reviewed and adjusted: Please take all your medications with you for your next visit with your Primary MD   Laboratory/radiological data: Please request your Primary MD to go over all hospital tests and procedure/radiological results at the follow up, please ask your Primary MD to get all Hospital records sent to his/her office.   In some cases, they will be blood work, cultures and biopsy results pending at the time of your discharge. Please request that your primary care M.D. goes through all the records of your hospital data and follows up on these results.   Also Note the following: If you experience worsening of your admission symptoms, develop shortness of breath, life threatening emergency, suicidal or homicidal thoughts you must seek medical attention immediately by calling 911 or calling your MD immediately  if symptoms less severe.   You must read complete instructions/literature along with all the possible adverse reactions/side effects for all the Medicines you take and that have been prescribed to you. Take any new Medicines after you have completely understood and accpet all the possible adverse reactions/side effects.    Do not drive when taking Pain medications or sleeping medications (Benzodaizepines)   Do not take more than prescribed Pain, Sleep and Anxiety Medications. It is not advisable to  combine anxiety,sleep and pain medications without talking with your primary care practitioner   Special Instructions: If you have smoked or chewed Tobacco  in the last 2 yrs please stop smoking, stop any regular Alcohol  and or any Recreational drug use.   Wear Seat belts while driving.   Please note: You were cared for by a hospitalist during your hospital stay. Once you are discharged, your primary care physician will handle any further medical issues.  Please note that NO REFILLS for any discharge medications will be authorized once you are discharged, as it is imperative that you return to your primary care physician (or establish a relationship with a primary care physician if you do not have one) for your post hospital discharge needs so that they can reassess your need for medications and monitor your lab values.  Time coordinating discharge: 35 minutes  SIGNED:  Marzetta Board, MD, PhD 09/18/2020, 8:58 AM

## 2020-09-18 NOTE — Discharge Instructions (Signed)
Follow with Isaiah Maltese, MD in 5-7 days  Please get a complete blood count and chemistry panel checked by your Primary MD at your next visit, and again as instructed by your Primary MD. Please get your medications reviewed and adjusted by your Primary MD.  Please request your Primary MD to go over all Hospital Tests and Procedure/Radiological results at the follow up, please get all Hospital records sent to your Prim MD by signing hospital release before you go home.  In some cases, there will be blood work, cultures and biopsy results pending at the time of your discharge. Please request that your primary care M.D. goes through all the records of your hospital data and follows up on these results.  If you had Pneumonia of Lung problems at the Hospital: Please get a 2 view Chest X ray done in 6-8 weeks after hospital discharge or sooner if instructed by your Primary MD.  If you have Congestive Heart Failure: Please call your Cardiologist or Primary MD anytime you have any of the following symptoms:  1) 3 pound weight gain in 24 hours or 5 pounds in 1 week  2) shortness of breath, with or without a dry hacking cough  3) swelling in the hands, feet or stomach  4) if you have to sleep on extra pillows at night in order to breathe  Follow cardiac low salt diet and 1.5 lit/day fluid restriction.  If you have diabetes Accuchecks 4 times/day, Once in AM empty stomach and then before each meal. Log in all results and show them to your primary doctor at your next visit. If any glucose reading is under 80 or above 300 call your primary MD immediately.  If you have Seizure/Convulsions/Epilepsy: Please do not drive, operate heavy machinery, participate in activities at heights or participate in high speed sports until you have seen by Primary MD or a Neurologist and advised to do so again. Per [facility] statutes, patients with seizures are not allowed to drive until they have been  seizure-free for six months.  Use caution when using heavy equipment or power tools. Avoid working on ladders or at heights. Take showers instead of baths. Ensure the water temperature is not too high on the home water heater. Do not go swimming alone. Do not lock yourself in a room alone (i.e. bathroom). When caring for infants or small children, sit down when holding, feeding, or changing them to minimize risk of injury to the child in the event you have a seizure. Maintain good sleep hygiene. Avoid alcohol.   If you had Gastrointestinal Bleeding: Please ask your Primary MD to check a complete blood count within one week of discharge or at your next visit. Your endoscopic/colonoscopic biopsies that are pending at the time of discharge, will also need to followed by your Primary MD.  Get Medicines reviewed and adjusted. Please take all your medications with you for your next visit with your Primary MD  Please request your Primary MD to go over all hospital tests and procedure/radiological results at the follow up, please ask your Primary MD to get all Hospital records sent to his/her office.  If you experience worsening of your admission symptoms, develop shortness of breath, life threatening emergency, suicidal or homicidal thoughts you must seek medical attention immediately by calling 911 or calling your MD immediately  if symptoms less severe.  You must read complete instructions/literature along with all the possible adverse reactions/side effects for all the Medicines you  take and that have been prescribed to you. Take any new Medicines after you have completely understood and accpet all the possible adverse reactions/side effects.   Do not drive or operate heavy machinery when taking Pain medications.   Do not take more than prescribed Pain, Sleep and Anxiety Medications  Special Instructions: If you have smoked or chewed Tobacco  in the last 2 yrs please stop smoking, stop any regular  Alcohol  and or any Recreational drug use.  Wear Seat belts while driving.  Please note You were cared for by a hospitalist during your hospital stay. If you have any questions about your discharge medications or the care you received while you were in the hospital after you are discharged, you can call the unit and asked to speak with the hospitalist on call if the hospitalist that took care of you is not available. Once you are discharged, your primary care physician will handle any further medical issues. Please note that NO REFILLS for any discharge medications will be authorized once you are discharged, as it is imperative that you return to your primary care physician (or establish a relationship with a primary care physician if you do not have one) for your aftercare needs so that they can reassess your need for medications and monitor your lab values.  You can reach the hospitalist office at phone [phone] or fax [phone]   If you do not have a primary care physician, you can call [phone] for a physician referral.  Activity: As tolerated with Full fall precautions use walker/cane & assistance as needed    Diet: regular  Disposition Home

## 2020-09-18 NOTE — Plan of Care (Signed)

## 2020-09-18 NOTE — Plan of Care (Signed)
  Problem: Education: Goal: Knowledge of General Education information will improve Description: Including pain rating scale, medication(s)/side effects and non-pharmacologic comfort measures 09/18/2020 1055 by Jesstin Studstill, Winifred Olive, RN Outcome: Adequate for Discharge 09/18/2020 1051 by Kailin Principato, Winifred Olive, RN Outcome: Progressing   Problem: Health Behavior/Discharge Planning: Goal: Ability to manage health-related needs will improve 09/18/2020 1055 by Maripaz Mullan, Winifred Olive, RN Outcome: Adequate for Discharge 09/18/2020 1051 by Vivien Rota, RN Outcome: Progressing   Problem: Clinical Measurements: Goal: Ability to maintain clinical measurements within normal limits will improve 09/18/2020 1055 by Emma-Lee Oddo, Winifred Olive, RN Outcome: Adequate for Discharge 09/18/2020 1051 by Vivien Rota, RN Outcome: Progressing Goal: Will remain free from infection 09/18/2020 1055 by Vivien Rota, RN Outcome: Adequate for Discharge 09/18/2020 1051 by Vivien Rota, RN Outcome: Progressing Goal: Diagnostic test results will improve 09/18/2020 1055 by Vivien Rota, RN Outcome: Adequate for Discharge 09/18/2020 1051 by Vivien Rota, RN Outcome: Progressing Goal: Respiratory complications will improve 09/18/2020 1055 by Vivien Rota, RN Outcome: Adequate for Discharge 09/18/2020 1051 by Vivien Rota, RN Outcome: Progressing Goal: Cardiovascular complication will be avoided 09/18/2020 1055 by Vivien Rota, RN Outcome: Adequate for Discharge 09/18/2020 1051 by Vivien Rota, RN Outcome: Progressing   Problem: Activity: Goal: Risk for activity intolerance will decrease 09/18/2020 1055 by Keyonta Madrid, Winifred Olive, RN Outcome: Adequate for Discharge 09/18/2020 1051 by Vivien Rota, RN Outcome: Progressing   Problem: Nutrition: Goal: Adequate nutrition will be maintained 09/18/2020 1055 by Vivien Rota, RN Outcome: Adequate for  Discharge 09/18/2020 1051 by Vivien Rota, RN Outcome: Progressing   Problem: Coping: Goal: Level of anxiety will decrease 09/18/2020 1055 by Vivien Rota, RN Outcome: Adequate for Discharge 09/18/2020 1051 by Vivien Rota, RN Outcome: Progressing   Problem: Elimination: Goal: Will not experience complications related to bowel motility 09/18/2020 1055 by Vivien Rota, RN Outcome: Adequate for Discharge 09/18/2020 1051 by Vivien Rota, RN Outcome: Progressing Goal: Will not experience complications related to urinary retention 09/18/2020 1055 by Vivien Rota, RN Outcome: Adequate for Discharge 09/18/2020 1051 by Vivien Rota, RN Outcome: Progressing   Problem: Pain Managment: Goal: General experience of comfort will improve 09/18/2020 1055 by Vivien Rota, RN Outcome: Adequate for Discharge 09/18/2020 1051 by Vivien Rota, RN Outcome: Progressing   Problem: Safety: Goal: Ability to remain free from injury will improve 09/18/2020 1055 by Pam Vanalstine, Winifred Olive, RN Outcome: Adequate for Discharge 09/18/2020 1051 by Vivien Rota, RN Outcome: Progressing   Problem: Skin Integrity: Goal: Risk for impaired skin integrity will decrease 09/18/2020 1055 by Vivien Rota, RN Outcome: Adequate for Discharge 09/18/2020 1051 by Vivien Rota, RN Outcome: Progressing

## 2020-10-04 DIAGNOSIS — N400 Enlarged prostate without lower urinary tract symptoms: Secondary | ICD-10-CM | POA: Diagnosis not present

## 2020-10-04 DIAGNOSIS — N200 Calculus of kidney: Secondary | ICD-10-CM | POA: Diagnosis not present

## 2020-10-04 DIAGNOSIS — R7301 Impaired fasting glucose: Secondary | ICD-10-CM | POA: Diagnosis not present

## 2020-10-04 DIAGNOSIS — M109 Gout, unspecified: Secondary | ICD-10-CM | POA: Diagnosis not present

## 2020-10-04 DIAGNOSIS — R609 Edema, unspecified: Secondary | ICD-10-CM | POA: Diagnosis not present

## 2020-10-04 DIAGNOSIS — I10 Essential (primary) hypertension: Secondary | ICD-10-CM | POA: Diagnosis not present

## 2020-10-04 DIAGNOSIS — M25572 Pain in left ankle and joints of left foot: Secondary | ICD-10-CM | POA: Diagnosis not present

## 2020-10-04 DIAGNOSIS — E559 Vitamin D deficiency, unspecified: Secondary | ICD-10-CM | POA: Diagnosis not present

## 2020-10-17 DIAGNOSIS — M2142 Flat foot [pes planus] (acquired), left foot: Secondary | ICD-10-CM | POA: Diagnosis not present

## 2020-11-22 DIAGNOSIS — M25572 Pain in left ankle and joints of left foot: Secondary | ICD-10-CM | POA: Diagnosis not present

## 2020-11-22 DIAGNOSIS — I10 Essential (primary) hypertension: Secondary | ICD-10-CM | POA: Diagnosis not present

## 2020-11-22 DIAGNOSIS — R69 Illness, unspecified: Secondary | ICD-10-CM | POA: Diagnosis not present

## 2021-01-18 DIAGNOSIS — U071 COVID-19: Secondary | ICD-10-CM | POA: Diagnosis not present

## 2021-01-18 DIAGNOSIS — Z20822 Contact with and (suspected) exposure to covid-19: Secondary | ICD-10-CM | POA: Diagnosis not present

## 2021-01-22 DIAGNOSIS — U071 COVID-19: Secondary | ICD-10-CM | POA: Diagnosis not present

## 2021-01-22 DIAGNOSIS — Z20822 Contact with and (suspected) exposure to covid-19: Secondary | ICD-10-CM | POA: Diagnosis not present

## 2021-01-30 DIAGNOSIS — L821 Other seborrheic keratosis: Secondary | ICD-10-CM | POA: Diagnosis not present

## 2021-01-30 DIAGNOSIS — L570 Actinic keratosis: Secondary | ICD-10-CM | POA: Diagnosis not present

## 2021-02-12 DIAGNOSIS — R7301 Impaired fasting glucose: Secondary | ICD-10-CM | POA: Diagnosis not present

## 2021-02-12 DIAGNOSIS — E782 Mixed hyperlipidemia: Secondary | ICD-10-CM | POA: Diagnosis not present

## 2021-02-12 DIAGNOSIS — I10 Essential (primary) hypertension: Secondary | ICD-10-CM | POA: Diagnosis not present

## 2021-02-12 DIAGNOSIS — N400 Enlarged prostate without lower urinary tract symptoms: Secondary | ICD-10-CM | POA: Diagnosis not present

## 2021-02-12 DIAGNOSIS — R5383 Other fatigue: Secondary | ICD-10-CM | POA: Diagnosis not present

## 2021-02-19 DIAGNOSIS — I10 Essential (primary) hypertension: Secondary | ICD-10-CM | POA: Diagnosis not present

## 2021-02-19 DIAGNOSIS — E782 Mixed hyperlipidemia: Secondary | ICD-10-CM | POA: Diagnosis not present

## 2021-02-19 DIAGNOSIS — E669 Obesity, unspecified: Secondary | ICD-10-CM | POA: Diagnosis not present

## 2021-02-19 DIAGNOSIS — R7303 Prediabetes: Secondary | ICD-10-CM | POA: Diagnosis not present

## 2021-05-14 DIAGNOSIS — G4700 Insomnia, unspecified: Secondary | ICD-10-CM | POA: Diagnosis not present

## 2021-05-14 DIAGNOSIS — I10 Essential (primary) hypertension: Secondary | ICD-10-CM | POA: Diagnosis not present

## 2021-05-14 DIAGNOSIS — R339 Retention of urine, unspecified: Secondary | ICD-10-CM | POA: Diagnosis not present

## 2021-05-14 DIAGNOSIS — E669 Obesity, unspecified: Secondary | ICD-10-CM | POA: Diagnosis not present

## 2021-05-14 DIAGNOSIS — E782 Mixed hyperlipidemia: Secondary | ICD-10-CM | POA: Diagnosis not present

## 2021-05-14 DIAGNOSIS — R7303 Prediabetes: Secondary | ICD-10-CM | POA: Diagnosis not present

## 2021-08-15 DIAGNOSIS — R7303 Prediabetes: Secondary | ICD-10-CM | POA: Diagnosis not present

## 2021-08-15 DIAGNOSIS — R339 Retention of urine, unspecified: Secondary | ICD-10-CM | POA: Diagnosis not present

## 2021-08-15 DIAGNOSIS — I10 Essential (primary) hypertension: Secondary | ICD-10-CM | POA: Diagnosis not present

## 2021-08-15 DIAGNOSIS — E782 Mixed hyperlipidemia: Secondary | ICD-10-CM | POA: Diagnosis not present

## 2021-08-22 DIAGNOSIS — K469 Unspecified abdominal hernia without obstruction or gangrene: Secondary | ICD-10-CM | POA: Diagnosis not present

## 2021-08-22 DIAGNOSIS — R7303 Prediabetes: Secondary | ICD-10-CM | POA: Diagnosis not present

## 2021-08-22 DIAGNOSIS — M65342 Trigger finger, left ring finger: Secondary | ICD-10-CM | POA: Diagnosis not present

## 2021-08-22 DIAGNOSIS — I10 Essential (primary) hypertension: Secondary | ICD-10-CM | POA: Diagnosis not present

## 2021-08-28 DIAGNOSIS — M720 Palmar fascial fibromatosis [Dupuytren]: Secondary | ICD-10-CM | POA: Diagnosis not present

## 2021-10-01 DIAGNOSIS — M720 Palmar fascial fibromatosis [Dupuytren]: Secondary | ICD-10-CM | POA: Diagnosis not present

## 2021-10-04 DIAGNOSIS — R319 Hematuria, unspecified: Secondary | ICD-10-CM | POA: Diagnosis not present

## 2021-10-29 DIAGNOSIS — M720 Palmar fascial fibromatosis [Dupuytren]: Secondary | ICD-10-CM | POA: Diagnosis not present

## 2021-11-25 DIAGNOSIS — C44311 Basal cell carcinoma of skin of nose: Secondary | ICD-10-CM | POA: Diagnosis not present

## 2021-11-25 DIAGNOSIS — D485 Neoplasm of uncertain behavior of skin: Secondary | ICD-10-CM | POA: Diagnosis not present

## 2021-12-13 DIAGNOSIS — I10 Essential (primary) hypertension: Secondary | ICD-10-CM | POA: Diagnosis not present

## 2021-12-13 DIAGNOSIS — E559 Vitamin D deficiency, unspecified: Secondary | ICD-10-CM | POA: Diagnosis not present

## 2021-12-13 DIAGNOSIS — E782 Mixed hyperlipidemia: Secondary | ICD-10-CM | POA: Diagnosis not present

## 2022-01-01 DIAGNOSIS — Z135 Encounter for screening for eye and ear disorders: Secondary | ICD-10-CM | POA: Diagnosis not present

## 2022-01-01 DIAGNOSIS — H5213 Myopia, bilateral: Secondary | ICD-10-CM | POA: Diagnosis not present

## 2022-01-01 DIAGNOSIS — H259 Unspecified age-related cataract: Secondary | ICD-10-CM | POA: Diagnosis not present

## 2022-01-13 DIAGNOSIS — E782 Mixed hyperlipidemia: Secondary | ICD-10-CM | POA: Diagnosis not present

## 2022-01-13 DIAGNOSIS — E559 Vitamin D deficiency, unspecified: Secondary | ICD-10-CM | POA: Diagnosis not present

## 2022-01-13 DIAGNOSIS — I10 Essential (primary) hypertension: Secondary | ICD-10-CM | POA: Diagnosis not present

## 2022-01-14 DIAGNOSIS — H25811 Combined forms of age-related cataract, right eye: Secondary | ICD-10-CM | POA: Diagnosis not present

## 2022-01-14 DIAGNOSIS — H25812 Combined forms of age-related cataract, left eye: Secondary | ICD-10-CM | POA: Diagnosis not present

## 2022-01-14 DIAGNOSIS — Z01818 Encounter for other preprocedural examination: Secondary | ICD-10-CM | POA: Diagnosis not present

## 2022-01-30 DIAGNOSIS — H25811 Combined forms of age-related cataract, right eye: Secondary | ICD-10-CM | POA: Diagnosis not present

## 2022-01-30 DIAGNOSIS — H269 Unspecified cataract: Secondary | ICD-10-CM | POA: Diagnosis not present

## 2022-03-04 DIAGNOSIS — I10 Essential (primary) hypertension: Secondary | ICD-10-CM | POA: Diagnosis not present

## 2022-03-04 DIAGNOSIS — E669 Obesity, unspecified: Secondary | ICD-10-CM | POA: Diagnosis not present

## 2022-03-04 DIAGNOSIS — E782 Mixed hyperlipidemia: Secondary | ICD-10-CM | POA: Diagnosis not present

## 2022-03-04 DIAGNOSIS — N400 Enlarged prostate without lower urinary tract symptoms: Secondary | ICD-10-CM | POA: Diagnosis not present

## 2022-03-04 DIAGNOSIS — E559 Vitamin D deficiency, unspecified: Secondary | ICD-10-CM | POA: Diagnosis not present

## 2022-03-04 DIAGNOSIS — R7303 Prediabetes: Secondary | ICD-10-CM | POA: Diagnosis not present

## 2022-03-13 DIAGNOSIS — H25812 Combined forms of age-related cataract, left eye: Secondary | ICD-10-CM | POA: Diagnosis not present

## 2022-03-13 DIAGNOSIS — H269 Unspecified cataract: Secondary | ICD-10-CM | POA: Diagnosis not present

## 2022-03-17 DIAGNOSIS — E782 Mixed hyperlipidemia: Secondary | ICD-10-CM | POA: Diagnosis not present

## 2022-03-17 DIAGNOSIS — L03119 Cellulitis of unspecified part of limb: Secondary | ICD-10-CM | POA: Diagnosis not present

## 2022-03-17 DIAGNOSIS — E669 Obesity, unspecified: Secondary | ICD-10-CM | POA: Diagnosis not present

## 2022-03-17 DIAGNOSIS — R350 Frequency of micturition: Secondary | ICD-10-CM | POA: Diagnosis not present

## 2022-03-17 DIAGNOSIS — I10 Essential (primary) hypertension: Secondary | ICD-10-CM | POA: Diagnosis not present

## 2022-03-24 DIAGNOSIS — M109 Gout, unspecified: Secondary | ICD-10-CM | POA: Diagnosis not present

## 2022-03-24 DIAGNOSIS — E782 Mixed hyperlipidemia: Secondary | ICD-10-CM | POA: Diagnosis not present

## 2022-03-24 DIAGNOSIS — R7302 Impaired glucose tolerance (oral): Secondary | ICD-10-CM | POA: Diagnosis not present

## 2022-03-24 DIAGNOSIS — I10 Essential (primary) hypertension: Secondary | ICD-10-CM | POA: Diagnosis not present

## 2022-03-24 DIAGNOSIS — L03119 Cellulitis of unspecified part of limb: Secondary | ICD-10-CM | POA: Diagnosis not present

## 2022-04-15 DIAGNOSIS — E559 Vitamin D deficiency, unspecified: Secondary | ICD-10-CM | POA: Diagnosis not present

## 2022-04-15 DIAGNOSIS — E782 Mixed hyperlipidemia: Secondary | ICD-10-CM | POA: Diagnosis not present

## 2022-04-15 DIAGNOSIS — I10 Essential (primary) hypertension: Secondary | ICD-10-CM | POA: Diagnosis not present

## 2022-04-16 ENCOUNTER — Ambulatory Visit
Admission: RE | Admit: 2022-04-16 | Discharge: 2022-04-16 | Disposition: A | Discharge status: Home / Self Care | Payer: Medicare HMO | Source: Ambulatory Visit | Attending: Urology | Admitting: Urology

## 2022-04-16 ENCOUNTER — Encounter: Payer: Self-pay | Admitting: Urology

## 2022-04-16 ENCOUNTER — Ambulatory Visit: Payer: Medicare HMO | Admitting: Urology

## 2022-04-16 VITALS — BP 171/70 | HR 67 | Ht 66.0 in | Wt 277.0 lb

## 2022-04-16 DIAGNOSIS — R32 Unspecified urinary incontinence: Secondary | ICD-10-CM | POA: Diagnosis not present

## 2022-04-16 DIAGNOSIS — N200 Calculus of kidney: Secondary | ICD-10-CM

## 2022-04-16 DIAGNOSIS — N3281 Overactive bladder: Secondary | ICD-10-CM | POA: Diagnosis not present

## 2022-04-16 DIAGNOSIS — Z9049 Acquired absence of other specified parts of digestive tract: Secondary | ICD-10-CM | POA: Diagnosis not present

## 2022-04-16 LAB — URINALYSIS, COMPLETE
Bilirubin, UA: NEGATIVE
Glucose, UA: NEGATIVE
Ketones, UA: NEGATIVE
Leukocytes,UA: NEGATIVE
Nitrite, UA: NEGATIVE
Protein,UA: NEGATIVE
Specific Gravity, UA: 1.02 (ref 1.005–1.030)
Urobilinogen, Ur: 0.2 mg/dL (ref 0.2–1.0)
pH, UA: 6 (ref 5.0–7.5)

## 2022-04-16 LAB — MICROSCOPIC EXAMINATION

## 2022-04-16 LAB — BLADDER SCAN AMB NON-IMAGING

## 2022-04-16 MED ORDER — OXYBUTYNIN CHLORIDE ER 10 MG PO TB24
10.0000 mg | ORAL_TABLET | Freq: Every day | ORAL | 11 refills | Status: DC
Start: 2022-04-16 — End: 2023-01-12

## 2022-04-16 NOTE — Progress Notes (Signed)
04/16/22 1:49 PM   Isaiah Walker 02/10/1946 [id number]  CC: Overactive bladder, urge incontinence, nephrolithiasis  HPI: 76 year old male previously followed by Dr. Lita Mains, Dr. Arna Medici, and myself with a long history of nephrolithiasis.  He previously has undergone PCNL and ureteroscopy with Dr. Erlene Quan for calcium oxalate stones.  He was last seen in February 2020 when KUB showed a stable 1 cm lower pole stone, but never followed up after that.  He is referred today for worsening urinary symptoms with urgency and urge incontinence, especially in the morning.  He has been on oxybutynin IR 5 mg daily without significant improvement, as well as finasteride long-term.  Previously discontinued PSA screening per the guideline recommendations.  He has passed a few small stones but denies any flank pain or gross hematuria.  Denies any dysuria.  He drinks flavored water during the day.  He denies any nocturia.  Urinalysis today is pending, PVR is normal at 44m.  PMH: Past Medical History:  Diagnosis Date   Arthritis    BPH (benign prostatic hyperplasia)    [place]    Hypertension    Kidney stones     Surgical History: Past Surgical History:  Procedure Laterality Date   CHOLECYSTECTOMY     CHOLECYSTECTOMY, LAPAROSCOPIC N/A    COLON SURGERY     Colectomy 2005   COLONOSCOPY N/A 01/11/2019   Procedure: COLONOSCOPY;  Surgeon: TVirgel Manifold MD;  Location: [facility] ENDOSCOPY;  Service: Endoscopy;  Laterality: N/A;   COLONOSCOPY WITH PROPOFOL N/A 02/11/2019   Procedure: COLONOSCOPY WITH PROPOFOL;  Surgeon: VLin Landsman MD;  [facility];  Service: Gastroenterology;  Laterality: N/A;  Pediatric endoscope   CYSTOSCOPY W/ RETROGRADES Bilateral 04/01/2017   Procedure: CYSTOSCOPY WITH RETROGRADE PYELOGRAM;  Surgeon: BHollice Espy MD;  Location: [facility] ORS;  Service: Urology;  Laterality: Bilateral;   CYSTOSCOPY WITH STENT PLACEMENT Bilateral 04/01/2017    Procedure: CYSTOSCOPY WITH STENT PLACEMENT;  Surgeon: BHollice Espy MD;  Location: [facility] ORS;  Service: Urology;  Laterality: Bilateral;   CYSTOSCOPY/URETEROSCOPY/HOLMIUM LASER/STENT PLACEMENT Left 10/16/2014   Procedure: CYSTOSCOPY/URETEROSCOPY/HOLMIUM LASER/STENT PLACEMENT;  Surgeon: AHollice Espy MD;  Location: [facility] ORS;  Service: Urology;  Laterality: Left;   CYSTOSCOPY/URETEROSCOPY/HOLMIUM LASER/STENT PLACEMENT Bilateral 04/20/2017   Procedure: CYSTOSCOPY/URETEROSCOPY/HOLMIUM LASER/STENT EXCHANGE;  Surgeon: BHollice Espy MD;  Location: [facility] ORS;  Service: Urology;  Laterality: Bilateral;   HERNIA REPAIR     Percutaneous Left    Nephrolithotomy    Family History: No family history on file.  Social History:  reports that he has never smoked. He has never been exposed to tobacco smoke. He has never used smokeless tobacco. He reports that he does not drink alcohol and does not use drugs.  Physical Exam: BP (!) 171/70   Pulse 67   Ht '5\' 6"'$  (1.676 m)   Wt 277 lb (125.6 kg)   BMI 44.71 kg/m    Constitutional:  Alert and oriented, No acute distress. Cardiovascular: No clubbing, cyanosis, or edema. Respiratory: Normal respiratory effort, no increased work of breathing. GI: Abdomen is soft, nontender, nondistended, no abdominal masses   Pertinent Imaging: I have personally viewed and interpreted the CT abdomen and pelvis from March 2022 showing a stable 1 cm left lower pole nonobstructing stone, no hydronephrosis, nondistended bladder, prostate measures 24 g.  Assessment & Plan:   76year old male with a number of comorbidities including morbid obesity with BMI of 45, long history of nephrolithiasis, and worsening overactive bladder symptoms of urgency and occasional urge incontinence.  We discussed that overactive bladder (OAB) is not a disease, but is a symptom complex that is generally not life-threatening.  Symptoms typically include urinary urgency, frequency, and urge  incontinence.  There are numerous treatment options, however there are risks and benefits with both medical and surgical management.  First-line treatment is behavioral therapies including bladder training, pelvic floor muscle training, and fluid management.  Second line treatments include oral antimuscarinics(Ditropan er, Trospium) and beta-3 agonist (Mybetriq). There is typically a period of medication trial (4-8 weeks) to find the optimal therapy and dosing. If symptoms are bothersome despite the above management, third line options include intra-detrusor botox, peripheral tibial nerve stimulation (PTNS), and interstim (SNS). These are more invasive treatments with higher side effect profile, but may improve quality of life for patients with severe OAB symptoms.   -I recommended changing his low-dose short acting oxybutynin to 10 mg oxybutynin XL, risk discussed -KUB today to evaluate for change in stone burden over the last year -RTC 8 weeks symptom check-if no improvement could trial Myrbetriq in the future  Nickolas Madrid, MD 04/16/2022  [place] [address], [place],  [address] [phone]

## 2022-04-22 ENCOUNTER — Telehealth: Payer: Self-pay | Admitting: Family Medicine

## 2022-04-22 DIAGNOSIS — N390 Urinary tract infection, site not specified: Secondary | ICD-10-CM | POA: Diagnosis not present

## 2022-04-22 NOTE — Telephone Encounter (Signed)
Patient called requesting a call back on the KUB results from 04/16/2022. Please advise

## 2022-04-22 NOTE — Telephone Encounter (Signed)
Called pt informed him of the below. Pt voiced understanding.

## 2022-04-28 DIAGNOSIS — N390 Urinary tract infection, site not specified: Secondary | ICD-10-CM | POA: Diagnosis not present

## 2022-04-28 DIAGNOSIS — E669 Obesity, unspecified: Secondary | ICD-10-CM | POA: Diagnosis not present

## 2022-04-28 DIAGNOSIS — Z0001 Encounter for general adult medical examination with abnormal findings: Secondary | ICD-10-CM | POA: Diagnosis not present

## 2022-04-28 DIAGNOSIS — I10 Essential (primary) hypertension: Secondary | ICD-10-CM | POA: Diagnosis not present

## 2022-05-15 DIAGNOSIS — E782 Mixed hyperlipidemia: Secondary | ICD-10-CM | POA: Diagnosis not present

## 2022-05-15 DIAGNOSIS — I10 Essential (primary) hypertension: Secondary | ICD-10-CM | POA: Diagnosis not present

## 2022-05-15 DIAGNOSIS — L300 Nummular dermatitis: Secondary | ICD-10-CM | POA: Diagnosis not present

## 2022-05-15 DIAGNOSIS — L570 Actinic keratosis: Secondary | ICD-10-CM | POA: Diagnosis not present

## 2022-06-19 ENCOUNTER — Ambulatory Visit: Payer: Medicare HMO | Admitting: Urology

## 2022-07-29 ENCOUNTER — Other Ambulatory Visit: Payer: Self-pay

## 2022-07-29 DIAGNOSIS — I10 Essential (primary) hypertension: Secondary | ICD-10-CM | POA: Diagnosis not present

## 2022-07-29 DIAGNOSIS — R7302 Impaired glucose tolerance (oral): Secondary | ICD-10-CM | POA: Diagnosis not present

## 2022-07-30 LAB — CBC WITH DIFFERENTIAL
Basophils Absolute: 0.1 10*3/uL (ref 0.0–0.2)
Basos: 1 %
EOS (ABSOLUTE): 0.2 10*3/uL (ref 0.0–0.4)
Eos: 2 %
Hematocrit: 41.6 % (ref 37.5–51.0)
Hemoglobin: 13.9 g/dL (ref 13.0–17.7)
Immature Grans (Abs): 0.1 10*3/uL (ref 0.0–0.1)
Immature Granulocytes: 2 %
Lymphocytes Absolute: 1.7 10*3/uL (ref 0.7–3.1)
Lymphs: 21 %
MCH: 29.6 pg (ref 26.6–33.0)
MCHC: 33.4 g/dL (ref 31.5–35.7)
MCV: 89 fL (ref 79–97)
Monocytes Absolute: 1 10*3/uL — ABNORMAL HIGH (ref 0.1–0.9)
Monocytes: 12 %
Neutrophils Absolute: 4.9 10*3/uL (ref 1.4–7.0)
Neutrophils: 62 %
RBC: 4.7 x10E6/uL (ref 4.14–5.80)
RDW: 12.7 % (ref 11.6–15.4)
WBC: 7.9 10*3/uL (ref 3.4–10.8)

## 2022-07-30 LAB — LIPID PANEL
Chol/HDL Ratio: 3.1 ratio (ref 0.0–5.0)
Cholesterol, Total: 140 mg/dL (ref 100–199)
HDL: 45 mg/dL (ref >39)
LDL Chol Calc (NIH): 80 mg/dL (ref 0–99)
Triglycerides: 73 mg/dL (ref 0–149)
VLDL Cholesterol Cal: 15 mg/dL (ref 5–40)

## 2022-07-30 LAB — CMP14+EGFR
ALT: 20 IU/L (ref 0–44)
AST: 12 IU/L (ref 0–40)
Albumin/Globulin Ratio: 2 (ref 1.2–2.2)
Albumin: 3.8 g/dL (ref 3.8–4.8)
Alkaline Phosphatase: 34 IU/L — ABNORMAL LOW (ref 44–121)
BUN/Creatinine Ratio: 24 (ref 10–24)
BUN: 25 mg/dL (ref 8–27)
Bilirubin Total: 0.3 mg/dL (ref 0.0–1.2)
CO2: 24 mmol/L (ref 20–29)
Calcium: 8.9 mg/dL (ref 8.6–10.2)
Chloride: 103 mmol/L (ref 96–106)
Creatinine, Ser: 1.05 mg/dL (ref 0.76–1.27)
Globulin, Total: 1.9 g/dL (ref 1.5–4.5)
Glucose: 112 mg/dL — ABNORMAL HIGH (ref 70–99)
Potassium: 4.6 mmol/L (ref 3.5–5.2)
Sodium: 140 mmol/L (ref 134–144)
Total Protein: 5.7 g/dL — ABNORMAL LOW (ref 6.0–8.5)
eGFR: 74 mL/min/{1.73_m2} (ref >59)

## 2022-07-30 LAB — HEMOGLOBIN A1C
Est. average glucose Bld gHb Est-mCnc: 126 mg/dL
Hgb A1c MFr Bld: 6 % — ABNORMAL HIGH (ref 4.8–5.6)

## 2022-08-04 ENCOUNTER — Telehealth: Payer: Self-pay

## 2022-08-04 NOTE — Telephone Encounter (Signed)
HTN Review Call  Isaiah Walker,Isaiah Walker  77 years, Male  DOB: 1946-04-01  M:   __________________________________________________ Hypertension Review (HC) Chart Review BP #1 reading (last): 136/68 on: 04/28/2022 BP #2 reading: 124/70 on: 03/24/2022 BP #3 reading: 128/72 on: 03/17/2022 Any of the last 3 BP > 140/90 mmHg?: No What recent interventions have been made by any provider to improve the patient'Walker conditions in the last 3 months?: Consults: 05/15/22 Dermatology Isaiah Walker, Isaiah Walker. For dermatitis. Isaiah Walker, Isaiah Walker No information available for medication changes.  Office Visits: 05/15/22 Isaiah Walker. For hypertension. . No information available for medication changes. 06/06/22 Isaiah Walker, Isaiah Walker ANP-C. (Not A Actual Visit) STARTED Azithromycin 250 mg use as directed . Has there been any documented recent hospitalizations or ED visits since last visit with Clinical Lead?: No Adherence Review Does the [facility] have access to medication refill data?: Yes Adherence rates for STAR metric medications: Losartan 100 mg - 05/26/22 90 DS Adherence rates for medications indicated for disease state being reviewed: Losartan 100 mg - 05/26/22 90 DS Does the patient have >5 day gap between last estimated fill dates for any of the above medications?: No Disease State Questions Able to connect with the Patient?: Yes Is the patient monitoring his/her BP?: No Review recommendations from CPP'Walker note of how often patient should be checking and encourage monitoring blood pressures if patient has history of high BP.: Done What is your blood pressure goal?: 120/80 Educate patient to inform proper points on checking BP at home:: Sit with feet flat on the floor, arm at heart level. What diet changes have you made to improve your Blood Pressure Control?: eating more home-cooked meals What exercise are you doing to improve your Blood Pressure Control?: no formal exercise Isaiah Walker, Isaiah Walker on 08/01/2022 12:39 PM [facility] Chart  Review: 10 min 08/01/22  HC Assessment call time spent: 15 min 08/01/22  CPP Office Visit Documentation: CPP Coordination of Care: CPP Care Plan Review: Clinical Lead Review Review Adherence gaps identified?: No Drug Therapy Problems identified?: No Assessment: Controlled  Engagement Notes Isaiah Walker on 08/04/2022 06:31 PM CPP Review: 67mns 22secs

## 2022-08-11 DIAGNOSIS — L905 Scar conditions and fibrosis of skin: Secondary | ICD-10-CM | POA: Diagnosis not present

## 2022-08-11 DIAGNOSIS — C4491 Basal cell carcinoma of skin, unspecified: Secondary | ICD-10-CM | POA: Diagnosis not present

## 2022-08-14 DIAGNOSIS — I10 Essential (primary) hypertension: Secondary | ICD-10-CM | POA: Diagnosis not present

## 2022-08-14 DIAGNOSIS — E782 Mixed hyperlipidemia: Secondary | ICD-10-CM | POA: Diagnosis not present

## 2022-08-14 DIAGNOSIS — E559 Vitamin D deficiency, unspecified: Secondary | ICD-10-CM | POA: Diagnosis not present

## 2022-08-23 ENCOUNTER — Other Ambulatory Visit: Payer: Self-pay | Admitting: Nurse Practitioner

## 2022-08-23 DIAGNOSIS — I10 Essential (primary) hypertension: Secondary | ICD-10-CM

## 2022-10-08 ENCOUNTER — Ambulatory Visit: Payer: Medicare HMO | Admitting: Nurse Practitioner

## 2022-10-31 ENCOUNTER — Other Ambulatory Visit: Payer: Self-pay | Admitting: Nurse Practitioner

## 2022-10-31 DIAGNOSIS — I10 Essential (primary) hypertension: Secondary | ICD-10-CM

## 2022-10-31 DIAGNOSIS — E785 Hyperlipidemia, unspecified: Secondary | ICD-10-CM

## 2022-10-31 DIAGNOSIS — E663 Overweight: Secondary | ICD-10-CM

## 2022-10-31 DIAGNOSIS — R7301 Impaired fasting glucose: Secondary | ICD-10-CM | POA: Diagnosis not present

## 2022-11-01 LAB — CMP14+EGFR
ALT: 22 IU/L (ref 0–44)
AST: 11 IU/L (ref 0–40)
Albumin/Globulin Ratio: 2.2 (ref 1.2–2.2)
Albumin: 4.3 g/dL (ref 3.8–4.8)
Alkaline Phosphatase: 32 IU/L — ABNORMAL LOW (ref 44–121)
BUN/Creatinine Ratio: 16 (ref 10–24)
BUN: 17 mg/dL (ref 8–27)
Bilirubin Total: 0.4 mg/dL (ref 0.0–1.2)
CO2: 24 mmol/L (ref 20–29)
Calcium: 9.1 mg/dL (ref 8.6–10.2)
Chloride: 101 mmol/L (ref 96–106)
Creatinine, Ser: 1.06 mg/dL (ref 0.76–1.27)
Globulin, Total: 2 g/dL (ref 1.5–4.5)
Glucose: 121 mg/dL — ABNORMAL HIGH (ref 70–99)
Potassium: 4.5 mmol/L (ref 3.5–5.2)
Sodium: 139 mmol/L (ref 134–144)
Total Protein: 6.3 g/dL (ref 6.0–8.5)
eGFR: 73 mL/min/{1.73_m2} (ref >59)

## 2022-11-01 LAB — LIPID PANEL
Chol/HDL Ratio: 3.4 ratio (ref 0.0–5.0)
Cholesterol, Total: 141 mg/dL (ref 100–199)
HDL: 42 mg/dL (ref >39)
LDL Chol Calc (NIH): 82 mg/dL (ref 0–99)
Triglycerides: 86 mg/dL (ref 0–149)
VLDL Cholesterol Cal: 17 mg/dL (ref 5–40)

## 2022-11-01 LAB — CBC WITH DIFFERENTIAL
Basophils Absolute: 0 10*3/uL (ref 0.0–0.2)
Basos: 1 %
EOS (ABSOLUTE): 0.2 10*3/uL (ref 0.0–0.4)
Eos: 2 %
Hematocrit: 43.6 % (ref 37.5–51.0)
Hemoglobin: 14.3 g/dL (ref 13.0–17.7)
Immature Grans (Abs): 0.1 10*3/uL (ref 0.0–0.1)
Immature Granulocytes: 2 %
Lymphocytes Absolute: 1.3 10*3/uL (ref 0.7–3.1)
Lymphs: 18 %
MCH: 29.7 pg (ref 26.6–33.0)
MCHC: 32.8 g/dL (ref 31.5–35.7)
MCV: 91 fL (ref 79–97)
Monocytes Absolute: 1.1 10*3/uL — ABNORMAL HIGH (ref 0.1–0.9)
Monocytes: 14 %
Neutrophils Absolute: 4.8 10*3/uL (ref 1.4–7.0)
Neutrophils: 63 %
RBC: 4.82 x10E6/uL (ref 4.14–5.80)
RDW: 13.2 % (ref 11.6–15.4)
WBC: 7.5 10*3/uL (ref 3.4–10.8)

## 2022-11-01 LAB — HEMOGLOBIN A1C
Est. average glucose Bld gHb Est-mCnc: 120 mg/dL
Hgb A1c MFr Bld: 5.8 % — ABNORMAL HIGH (ref 4.8–5.6)

## 2022-11-01 LAB — TSH: TSH: 3.84 u[IU]/mL (ref 0.450–4.500)

## 2022-11-07 ENCOUNTER — Other Ambulatory Visit: Payer: Self-pay | Admitting: Nurse Practitioner

## 2022-11-07 DIAGNOSIS — N400 Enlarged prostate without lower urinary tract symptoms: Secondary | ICD-10-CM

## 2022-11-11 ENCOUNTER — Encounter: Payer: Self-pay | Admitting: Nurse Practitioner

## 2022-11-11 ENCOUNTER — Ambulatory Visit (INDEPENDENT_AMBULATORY_CARE_PROVIDER_SITE_OTHER): Payer: Medicare HMO | Admitting: Nurse Practitioner

## 2022-11-11 VITALS — BP 144/76 | HR 81 | Ht 66.0 in | Wt 288.0 lb

## 2022-11-11 DIAGNOSIS — M25551 Pain in right hip: Secondary | ICD-10-CM

## 2022-11-11 DIAGNOSIS — I10 Essential (primary) hypertension: Secondary | ICD-10-CM

## 2022-11-11 DIAGNOSIS — R7303 Prediabetes: Secondary | ICD-10-CM

## 2022-11-11 DIAGNOSIS — G629 Polyneuropathy, unspecified: Secondary | ICD-10-CM | POA: Diagnosis not present

## 2022-11-11 MED ORDER — CELECOXIB 200 MG PO CAPS: 200.0000 mg | ORAL_CAPSULE | Freq: Two times a day (BID) | ORAL | 2 refills | Status: DC

## 2022-11-11 MED ORDER — ZEPBOUND 2.5 MG/0.5ML ~~LOC~~ SOAJ: 2.5000 mg | SUBCUTANEOUS | 2 refills | Status: DC

## 2022-11-11 NOTE — Progress Notes (Signed)
Established Patient Office Visit  Subjective:  Patient ID: Isaiah Walker, male    DOB: March 01, 1946  Age: 77 y.o. MRN: [id number]  Chief Complaint  Patient presents with   Follow-up    Follow up    Patient here today for a follow up and review of his labs.  However, both lower extremities are numb, tingling and burning.  Patient agrees to go to V/V for eval.  Will consider polyneuropathy treatment if veins are fine but patient does have both varicose and spider veins.  A1c decreased from 6.0% to 5.8%, patient congratulated and LDL is in the 80's, patient will try to lower it even more.      No other concerns at this time.   Past Medical History:  Diagnosis Date   Arthritis    BPH (benign prostatic hyperplasia)    Crohn's disease (H[facility])    Hypertension    Kidney stones     Past Surgical History:  Procedure Laterality Date   CHOLECYSTECTOMY     CHOLECYSTECTOMY, LAPAROSCOPIC N/A    COLON SURGERY     Colectomy 2005   COLONOSCOPY N/A 01/11/2019   Procedure: COLONOSCOPY;  Surgeon: Pasty Spillers, MD;  Location: [facility] ENDOSCOPY;  Service: Endoscopy;  Laterality: N/A;   COLONOSCOPY WITH PROPOFOL N/A 02/11/2019   Procedure: COLONOSCOPY WITH PROPOFOL;  Surgeon: Toney Reil, MD;  Location: [facility]COPY;  Service: Gastroenterology;  Laterality: N/A;  Pediatric endoscope   CYSTOSCOPY W/ RETROGRADES Bilateral 04/01/2017   Procedure: CYSTOSCOPY WITH RETROGRADE PYELOGRAM;  Surgeon: Vanna Scotland, MD;  Location: [facility] ORS;  Service: Urology;  Laterality: Bilateral;   CYSTOSCOPY WITH STENT PLACEMENT Bilateral 04/01/2017   Procedure: CYSTOSCOPY WITH STENT PLACEMENT;  Surgeon: Vanna Scotland, MD;  Location: [facility] ORS;  Service: Urology;  Laterality: Bilateral;   CYSTOSCOPY/URETEROSCOPY/HOLMIUM LASER/STENT PLACEMENT Left 10/16/2014   Procedure: CYSTOSCOPY/URETEROSCOPY/HOLMIUM LASER/STENT PLACEMENT;  Surgeon: Vanna Scotland, MD;  Location: [facility] ORS;  Service: Urology;  Laterality: Left;    CYSTOSCOPY/URETEROSCOPY/HOLMIUM LASER/STENT PLACEMENT Bilateral 04/20/2017   Procedure: CYSTOSCOPY/URETEROSCOPY/HOLMIUM LASER/STENT EXCHANGE;  Surgeon: Vanna Scotland, MD;  Location: [facility] ORS;  Service: Urology;  Laterality: Bilateral;   HERNIA REPAIR     Percutaneous Left    Nephrolithotomy    Social History   Socioeconomic History   Marital status: Married    Spouse name: Not on file   Number of children: Not on file   Years of education: Not on file   Highest education level: Not on file  Occupational History   Not on file  Tobacco Use   Smoking status: Never    Passive exposure: Never   Smokeless tobacco: Never  Vaping Use   Vaping Use: Never used  Substance and Sexual Activity   Alcohol use: No   Drug use: No   Sexual activity: Not on file  Other Topics Concern   Not on file  Social History Narrative   Not on file   Social Determinants of Health   Financial Resource Strain: Not on file  Food Insecurity: Not on file  Transportation Needs: Not on file  Physical Activity: Not on file  Stress: Not on file  Social Connections: Not on file  Intimate Partner Violence: Not on file    No family history on file.  No Known Allergies  Review of Systems  Constitutional: Negative.   HENT: Negative.    Eyes: Negative.   Respiratory: Negative.    Cardiovascular: Negative.   Gastrointestinal: Negative.   Genitourinary: Negative.   Musculoskeletal:  Positive  for joint pain and myalgias.  Skin: Negative.   Neurological: Negative.   Endo/Heme/Allergies: Negative.   Psychiatric/Behavioral: Negative.         Objective:   BP (!) 144/76   Pulse 81   Ht 5\' 6"  (1.676 m)   Wt 288 lb (130.6 kg)   SpO2 93%   BMI 46.48 kg/m   Vitals:   11/11/22 1523  BP: (!) 144/76  Pulse: 81  Height: 5\' 6"  (1.676 m)  Weight: 288 lb (130.6 kg)  SpO2: 93%  BMI (Calculated): 46.51    Physical Exam Vitals and nursing note reviewed.  Constitutional:      Appearance: Normal  appearance.  HENT:     Head: Normocephalic.     Nose: Nose normal.  Eyes:     Pupils: Pupils are equal, round, and reactive to light.  Cardiovascular:     Rate and Rhythm: Normal rate and regular rhythm.  Pulmonary:     Effort: Pulmonary effort is normal.     Breath sounds: Normal breath sounds.  Abdominal:     General: Bowel sounds are normal.     Palpations: Abdomen is soft.  Musculoskeletal:        General: Swelling and tenderness present.     Cervical back: Normal range of motion and neck supple.  Skin:    General: Skin is warm and dry.  Neurological:     Mental Status: He is alert and oriented to person, place, and time.  Psychiatric:        Mood and Affect: Mood normal.        Behavior: Behavior normal.      No results found for any visits on 11/11/22.  Recent Results (from the past 2160 hour(s))  Hemoglobin A1c     Status: Abnormal   Collection Time: 10/31/22  8:55 AM  Result Value Ref Range   Hgb A1c MFr Bld 5.8 (H) 4.8 - 5.6 %    Comment:          Prediabetes: 5.7 - 6.4          Diabetes: >6.4          Glycemic control for adults with diabetes: <7.0    Est. average glucose Bld gHb Est-mCnc 120 mg/dL  TSH     Status: None   Collection Time: 10/31/22  8:55 AM  Result Value Ref Range   TSH 3.840 0.450 - 4.500 uIU/mL  CMP14+EGFR     Status: Abnormal   Collection Time: 10/31/22  8:55 AM  Result Value Ref Range   Glucose 121 (H) 70 - 99 mg/dL   BUN 17 8 - 27 mg/dL   Creatinine, Ser 1.61 0.76 - 1.27 mg/dL   eGFR 73 >09 UE/AVW/0.98   BUN/Creatinine Ratio 16 10 - 24   Sodium 139 134 - 144 mmol/L   Potassium 4.5 3.5 - 5.2 mmol/L   Chloride 101 96 - 106 mmol/L   CO2 24 20 - 29 mmol/L   Calcium 9.1 8.6 - 10.2 mg/dL   Total Protein 6.3 6.0 - 8.5 g/dL   Albumin 4.3 3.8 - 4.8 g/dL   Globulin, Total 2.0 1.5 - 4.5 g/dL   Albumin/Globulin Ratio 2.2 1.2 - 2.2   Bilirubin Total 0.4 0.0 - 1.2 mg/dL   Alkaline Phosphatase 32 (L) 44 - 121 IU/L   AST 11 0 - 40 IU/L    ALT 22 0 - 44 IU/L  Lipid panel     Status: None   Collection  Time: 10/31/22  8:55 AM  Result Value Ref Range   Cholesterol, Total 141 100 - 199 mg/dL   Triglycerides 86 0 - 149 mg/dL   HDL 42 >40 mg/dL   VLDL Cholesterol Cal 17 5 - 40 mg/dL   LDL Chol Calc (NIH) 82 0 - 99 mg/dL   Chol/HDL Ratio 3.4 0.0 - 5.0 ratio    Comment:                                   T. Chol/HDL Ratio                                             Men  Women                               1/2 Avg.Risk  3.4    3.3                                   Avg.Risk  5.0    4.4                                2X Avg.Risk  9.6    7.1                                3X Avg.Risk 23.4   11.0   CBC With Differential     Status: Abnormal   Collection Time: 10/31/22  8:55 AM  Result Value Ref Range   WBC 7.5 3.4 - 10.8 x10E3/uL   RBC 4.82 4.14 - 5.80 x10E6/uL   Hemoglobin 14.3 13.0 - 17.7 g/dL   Hematocrit 98.1 19.1 - 51.0 %   MCV 91 79 - 97 fL   MCH 29.7 26.6 - 33.0 pg   MCHC 32.8 31.5 - 35.7 g/dL   RDW 47.8 29.5 - 62.1 %   Neutrophils 63 Not Estab. %   Lymphs 18 Not Estab. %   Monocytes 14 Not Estab. %   Eos 2 Not Estab. %   Basos 1 Not Estab. %   Neutrophils Absolute 4.8 1.4 - 7.0 x10E3/uL   Lymphocytes Absolute 1.3 0.7 - 3.1 x10E3/uL   Monocytes Absolute 1.1 (H) 0.1 - 0.9 x10E3/uL   EOS (ABSOLUTE) 0.2 0.0 - 0.4 x10E3/uL   Basophils Absolute 0.0 0.0 - 0.2 x10E3/uL   Immature Granulocytes 2 Not Estab. %   Immature Grans (Abs) 0.1 0.0 - 0.1 x10E3/uL      Assessment & Plan:  1) V/V referral - BLE numbness/tingling 2) Increase celebrex from 100 mg daily, 200 mg BID 3) Will increase gabapentin after eval of V/V 4) Follow up appt in 4 months, fasting labs prior 5) Right hip xray Problem List Items Addressed This Visit       Cardiovascular and Mediastinum   HTN (hypertension)     Nervous and Auditory   Neuropathy - Primary   Relevant Orders   Ambulatory referral to Vascular Surgery     Other   Pain of right  hip   Relevant Orders  DG HIPS BILAT W OR W/O PELVIS 3-4 VIEWS   Prediabetes    No follow-ups on file.   Total time spent: 40 minutes  Orson Eva, NP  11/11/2022   This document may have been prepared by [facility] Voice Recognition software and as such may include unintentional dictation errors.

## 2022-11-11 NOTE — Addendum Note (Signed)
Addended by: Orson Eva on: 11/11/2022 04:13 PM   Modules accepted: Orders

## 2022-11-13 ENCOUNTER — Ambulatory Visit (INDEPENDENT_AMBULATORY_CARE_PROVIDER_SITE_OTHER): Payer: Medicare HMO

## 2022-11-13 DIAGNOSIS — M25551 Pain in right hip: Secondary | ICD-10-CM | POA: Diagnosis not present

## 2022-11-14 NOTE — Progress Notes (Signed)
Pt informed.cc

## 2022-11-21 ENCOUNTER — Ambulatory Visit: Payer: Medicare HMO | Admitting: Physician Assistant

## 2022-11-21 ENCOUNTER — Encounter: Payer: Self-pay | Admitting: Physician Assistant

## 2022-11-21 VITALS — BP 132/76 | HR 59 | Ht 66.0 in | Wt 288.0 lb

## 2022-11-21 DIAGNOSIS — N50819 Testicular pain, unspecified: Secondary | ICD-10-CM | POA: Diagnosis not present

## 2022-11-21 DIAGNOSIS — N5089 Other specified disorders of the male genital organs: Secondary | ICD-10-CM

## 2022-11-21 DIAGNOSIS — R32 Unspecified urinary incontinence: Secondary | ICD-10-CM | POA: Diagnosis not present

## 2022-11-21 LAB — MICROSCOPIC EXAMINATION: Bacteria, UA: NONE SEEN

## 2022-11-21 LAB — URINALYSIS, COMPLETE
Bilirubin, UA: NEGATIVE
Glucose, UA: NEGATIVE
Ketones, UA: NEGATIVE
Leukocytes,UA: NEGATIVE
Nitrite, UA: NEGATIVE
Protein,UA: NEGATIVE
RBC, UA: NEGATIVE
Specific Gravity, UA: 1.02 (ref 1.005–1.030)
Urobilinogen, Ur: 0.2 mg/dL (ref 0.2–1.0)
pH, UA: 5.5 (ref 5.0–7.5)

## 2022-11-21 NOTE — Progress Notes (Unsigned)
11/21/2022 2:16 PM   Isaiah Walker 26-Oct-1945 [id number]  CC: Chief Complaint  Patient presents with   Acute Visit   Testicle Pain   HPI: Isaiah Walker is a 77 y.o. male with PMH OAB wet on oxybutynin, nephrolithiasis, BPH on finasteride, and remote vasectomy who presents today for evaluation of right testicular swelling.   Today he reports chronic enlargement/swelling of his right testicle.  It can be uncomfortable for him when he sits on it and it tends to pinch.  He denies redness or dysuria.  He notes that his scrotum has been a bit tender since undergoing vasectomy many years ago.  In-office UA and microscopy today pan negative.  PMH: Past Medical History:  Diagnosis Date   Arthritis    BPH (benign prostatic hyperplasia)    Crohn's disease (HCC)    Hypertension    Kidney stones     Surgical History: Past Surgical History:  Procedure Laterality Date   CHOLECYSTECTOMY     CHOLECYSTECTOMY, LAPAROSCOPIC N/A    COLON SURGERY     Colectomy 2005   COLONOSCOPY N/A 01/11/2019   Procedure: COLONOSCOPY;  Surgeon: Pasty Spillers, MD;  Location: [facility] ENDOSCOPY;  Service: Endoscopy;  Laterality: N/A;   COLONOSCOPY WITH PROPOFOL N/A 02/11/2019   Procedure: COLONOSCOPY WITH PROPOFOL;  Surgeon: Toney Reil, MD;  Location: [facility] ENDOSCOPY;  Service: Gastroenterology;  Laterality: N/A;  Pediatric endoscope   CYSTOSCOPY W/ RETROGRADES Bilateral 04/01/2017   Procedure: CYSTOSCOPY WITH RETROGRADE PYELOGRAM;  Surgeon: Vanna Scotland, MD;  Location: [facility] ORS;  Service: Urology;  Laterality: Bilateral;   CYSTOSCOPY WITH STENT PLACEMENT Bilateral 04/01/2017   Procedure: CYSTOSCOPY WITH STENT PLACEMENT;  Surgeon: Vanna Scotland, MD;  Location: [facility] ORS;  Service: Urology;  Laterality: Bilateral;   CYSTOSCOPY/URETEROSCOPY/HOLMIUM LASER/STENT PLACEMENT Left 10/16/2014   Procedure: CYSTOSCOPY/URETEROSCOPY/HOLMIUM LASER/STENT PLACEMENT;  Surgeon: Vanna Scotland, MD;  Location: [facility] ORS;   Service: Urology;  Laterality: Left;   CYSTOSCOPY/URETEROSCOPY/HOLMIUM LASER/STENT PLACEMENT Bilateral 04/20/2017   Procedure: CYSTOSCOPY/URETEROSCOPY/HOLMIUM LASER/STENT EXCHANGE;  Surgeon: Vanna Scotland, MD;  Location: [facility] ORS;  Service: Urology;  Laterality: Bilateral;   HERNIA REPAIR     Percutaneous Left    Nephrolithotomy    Home Medications:  Allergies as of 11/21/2022   No Known Allergies      Medication List        Accurate as of November 21, 2022  2:16 PM. If you have any questions, ask your nurse or doctor.          allopurinol 300 MG tablet Commonly known as: ZYLOPRIM Take 300 mg by mouth daily.   amLODipine 2.5 MG tablet Commonly known as: NORVASC Take 2.5 mg by mouth daily.   celecoxib 200 MG capsule Commonly known as: CeleBREX Take 1 capsule (200 mg total) by mouth 2 (two) times daily.   finasteride 5 MG tablet Commonly known as: PROSCAR TAKE 1 TABLET BY MOUTH EVERY DAY   gabapentin 300 MG capsule Commonly known as: NEURONTIN Take 300 mg by mouth 2 (two) times daily.   losartan 100 MG tablet Commonly known as: COZAAR TAKE 1 TABLET BY MOUTH EVERY DAY IN THE MORNING   meloxicam 15 MG tablet Commonly known as: MOBIC Take 15 mg by mouth daily as needed.   metoprolol tartrate 50 MG tablet Commonly known as: LOPRESSOR Take 50 mg by mouth 2 (two) times daily.   oxybutynin 10 MG 24 hr tablet Commonly known as: DITROPAN-XL Take 1 tablet (10 mg total) by mouth daily.   traZODone  150 MG tablet Commonly known as: DESYREL TAKE 1 TABLET BY MOUTH EVERYDAY AT BEDTIME   Zepbound 2.5 MG/0.5ML Pen Generic drug: tirzepatide Inject 2.5 mg into the skin once a week.        Allergies:  No Known Allergies  Family History: No family history on file.  Social History:   reports that he has never smoked. He has never been exposed to tobacco smoke. He has never used smokeless tobacco. He reports that he does not drink alcohol and does not use  drugs.  Physical Exam: BP 132/76   Pulse (!) 59   Ht 5\' 6"  (1.676 m)   Wt 288 lb (130.6 kg)   BMI 46.48 kg/m   Constitutional:  Alert and oriented, no acute distress, nontoxic appearing HEENT: Center, AT Cardiovascular: No clubbing, cyanosis, or edema Respiratory: Normal respiratory effort, no increased work of breathing GU: Bilateral descended testicles.  The right testicle is enlarged without palpable mass.  Nontender bilateral epididymides.  Tender bilateral spermatic cords.  No scrotal swelling or redness. Skin: No rashes, bruises or suspicious lesions Neurologic: Grossly intact, no focal deficits, moving all 4 extremities Psychiatric: Normal mood and affect  Laboratory Data: Results for orders placed or performed in visit on 11/21/22  Microscopic Examination   Urine  Result Value Ref Range   WBC, UA 0-5 0 - 5 /hpf   RBC, Urine 0-2 0 - 2 /hpf   Epithelial Cells (non renal) 0-10 0 - 10 /hpf   Bacteria, UA None seen None seen/Few  Urinalysis, Complete  Result Value Ref Range   Specific Gravity, UA 1.020 1.005 - 1.030   pH, UA 5.5 5.0 - 7.5   Color, UA Yellow Yellow   Appearance Ur Clear Clear   Leukocytes,UA Negative Negative   Protein,UA Negative Negative/Trace   Glucose, UA Negative Negative   Ketones, UA Negative Negative   RBC, UA Negative Negative   Bilirubin, UA Negative Negative   Urobilinogen, Ur 0.2 0.2 - 1.0 mg/dL   Nitrite, UA Negative Negative   Microscopic Examination See below:    Assessment & Plan:   1. Swelling of right testicle Chronically enlarged right testicle.  UA is bland.  Low suspicion for infectious process.  I strongly suspect right hydrocele, and while this is overall not particularly enlarged, it is symptomatic for him I anticipate largely due to habitus.  Will order scrotal ultrasound to confirm and contact him with results.  We discussed that the management of hydrocele is based on symptomology and if he wishes to consider hydrocelectomy I  can set him up with Dr. Richardo Hanks to discuss further. - Urinalysis, Complete - US SCROTUM W/DOPPLER; Future  Return for Will call with results.  Carman Ching, PA-C  [facility] [place] [address] [place], [place] [address] [phone]

## 2022-12-02 ENCOUNTER — Other Ambulatory Visit: Payer: Self-pay | Admitting: Nurse Practitioner

## 2022-12-06 ENCOUNTER — Other Ambulatory Visit: Payer: Self-pay | Admitting: Nurse Practitioner

## 2022-12-08 ENCOUNTER — Ambulatory Visit
Admission: RE | Admit: 2022-12-08 | Discharge: 2022-12-08 | Disposition: A | Discharge status: Home / Self Care | Payer: Medicare HMO | Source: Ambulatory Visit | Attending: Physician Assistant | Admitting: Physician Assistant

## 2022-12-08 ENCOUNTER — Other Ambulatory Visit (INDEPENDENT_AMBULATORY_CARE_PROVIDER_SITE_OTHER): Payer: Self-pay | Admitting: Nurse Practitioner

## 2022-12-08 DIAGNOSIS — N5089 Other specified disorders of the male genital organs: Secondary | ICD-10-CM | POA: Insufficient documentation

## 2022-12-08 DIAGNOSIS — N442 Benign cyst of testis: Secondary | ICD-10-CM | POA: Diagnosis not present

## 2022-12-08 DIAGNOSIS — R202 Paresthesia of skin: Secondary | ICD-10-CM

## 2022-12-08 DIAGNOSIS — N433 Hydrocele, unspecified: Secondary | ICD-10-CM | POA: Diagnosis not present

## 2022-12-08 DIAGNOSIS — R200 Anesthesia of skin: Secondary | ICD-10-CM

## 2022-12-11 ENCOUNTER — Ambulatory Visit (INDEPENDENT_AMBULATORY_CARE_PROVIDER_SITE_OTHER): Payer: Medicare HMO | Admitting: Nurse Practitioner

## 2022-12-11 ENCOUNTER — Encounter (INDEPENDENT_AMBULATORY_CARE_PROVIDER_SITE_OTHER): Payer: Medicare HMO

## 2022-12-11 ENCOUNTER — Encounter (INDEPENDENT_AMBULATORY_CARE_PROVIDER_SITE_OTHER): Payer: Self-pay | Admitting: Nurse Practitioner

## 2022-12-11 ENCOUNTER — Ambulatory Visit (INDEPENDENT_AMBULATORY_CARE_PROVIDER_SITE_OTHER): Payer: Medicare HMO

## 2022-12-11 VITALS — BP 154/83 | HR 55 | Resp 18 | Ht 66.0 in | Wt 290.0 lb

## 2022-12-11 DIAGNOSIS — G629 Polyneuropathy, unspecified: Secondary | ICD-10-CM

## 2022-12-11 DIAGNOSIS — I8312 Varicose veins of left lower extremity with inflammation: Secondary | ICD-10-CM

## 2022-12-11 DIAGNOSIS — R200 Anesthesia of skin: Secondary | ICD-10-CM | POA: Diagnosis not present

## 2022-12-11 DIAGNOSIS — R202 Paresthesia of skin: Secondary | ICD-10-CM

## 2022-12-11 DIAGNOSIS — I10 Essential (primary) hypertension: Secondary | ICD-10-CM

## 2022-12-11 DIAGNOSIS — I8311 Varicose veins of right lower extremity with inflammation: Secondary | ICD-10-CM | POA: Diagnosis not present

## 2022-12-11 DIAGNOSIS — M5417 Radiculopathy, lumbosacral region: Secondary | ICD-10-CM | POA: Insufficient documentation

## 2022-12-11 NOTE — Progress Notes (Signed)
Subjective:    Patient ID: Isaiah Walker, male    DOB: 11/12/1945, 77 y.o.   MRN: [id number] Chief Complaint  Patient presents with   Establish Care    Isaiah Walker is a 77 year old male that presents today for evaluation of lower extremity numbness.  He has had issues with lower extremity numbness for some time now.  He notes that it gets worse when he is at rest.  He also endorses having lower extremity edema.  He denies any open wounds or ulcerations.  He denies symptoms that are consistent with claudication-like symptoms.  He denies rest pain like symptoms.  Also has history of lower back issues.  Today noninvasive studies show an ABI of 1.23 on the right and 1.21 on the left.  Patient has normal TBI's bilaterally.  He has strong triphasic tibial artery waveforms bilaterally with normal toe waveforms bilaterally.    Review of Systems  Cardiovascular:  Positive for leg swelling.  Neurological:  Positive for numbness.  All other systems reviewed and are negative.      Objective:   Physical Exam Vitals reviewed.  HENT:     Head: Normocephalic.  Cardiovascular:     Rate and Rhythm: Normal rate.     Pulses:          Dorsalis pedis pulses are detected w/ Doppler on the right side and detected w/ Doppler on the left side.       Posterior tibial pulses are detected w/ Doppler on the right side and detected w/ Doppler on the left side.  Pulmonary:     Effort: Pulmonary effort is normal.  Musculoskeletal:     Right lower leg: Edema present.     Left lower leg: Edema present.  Skin:    General: Skin is warm and dry.  Neurological:     Mental Status: He is alert and oriented to person, place, and time.  Psychiatric:        Mood and Affect: Mood normal.        Behavior: Behavior normal.        Thought Content: Thought content normal.        Judgment: Judgment normal.     BP (!) 154/83 (BP Location: Left Arm)   Pulse (!) 55   Resp 18   Ht 5\' 6"  (1.676 m)   Wt 290 lb (131.5 kg)    BMI 46.81 kg/m   Past Medical History:  Diagnosis Date   Arthritis    BPH (benign prostatic hyperplasia)    Crohn's disease (HCC)    Hypertension    Kidney stones     Social History   Socioeconomic History   Marital status: Married    Spouse name: Not on file   Number of children: Not on file   Years of education: Not on file   Highest education level: Not on file  Occupational History   Not on file  Tobacco Use   Smoking status: Never    Passive exposure: Never   Smokeless tobacco: Never  Vaping Use   Vaping Use: Never used  Substance and Sexual Activity   Alcohol use: No   Drug use: No   Sexual activity: Not Currently  Other Topics Concern   Not on file  Social History Narrative   Not on file   Social Determinants of Health   Financial Resource Strain: Not on file  Food Insecurity: Not on file  Transportation Needs: Not on file  Physical Activity: Not  on file  Stress: Not on file  Social Connections: Not on file  Intimate Partner Violence: Not on file    Past Surgical History:  Procedure Laterality Date   CHOLECYSTECTOMY     CHOLECYSTECTOMY, LAPAROSCOPIC N/A    COLON SURGERY     Colectomy 2005   COLONOSCOPY N/A 01/11/2019   Procedure: COLONOSCOPY;  Surgeon: Pasty Spillers, MD;  Location: [facility] ENDOSCOPY;  Service: Endoscopy;  Laterality: N/A;   COLONOSCOPY WITH PROPOFOL N/A 02/11/2019   Procedure: COLONOSCOPY WITH PROPOFOL;  Surgeon: Toney Reil, MD;  Location: [facility] ENDOSCOPY;  Service: Gastroenterology;  Laterality: N/A;  Pediatric endoscope   CYSTOSCOPY W/ RETROGRADES Bilateral 04/01/2017   Procedure: CYSTOSCOPY WITH RETROGRADE PYELOGRAM;  Surgeon: Vanna Scotland, MD;  Location: [facility] ORS;  Service: Urology;  Laterality: Bilateral;   CYSTOSCOPY WITH STENT PLACEMENT Bilateral 04/01/2017   Procedure: CYSTOSCOPY WITH STENT PLACEMENT;  Surgeon: Vanna Scotland, MD;  Location: [facility] ORS;  Service: Urology;  Laterality: Bilateral;    CYSTOSCOPY/URETEROSCOPY/HOLMIUM LASER/STENT PLACEMENT Left 10/16/2014   Procedure: CYSTOSCOPY/URETEROSCOPY/HOLMIUM LASER/STENT PLACEMENT;  Surgeon: Vanna Scotland, MD;  Location: [facility] ORS;  Service: Urology;  Laterality: Left;   CYSTOSCOPY/URETEROSCOPY/HOLMIUM LASER/STENT PLACEMENT Bilateral 04/20/2017   Procedure: CYSTOSCOPY/URETEROSCOPY/HOLMIUM LASER/STENT EXCHANGE;  Surgeon: Vanna Scotland, MD;  Location: [facility] ORS;  Service: Urology;  Laterality: Bilateral;   HERNIA REPAIR     Percutaneous Left    Nephrolithotomy    Family History  Problem Relation Age of Onset   Parkinson's disease Mother    Heart disease Father    Lymphoma Brother    Lung cancer Brother    Heart disease Brother     No Known Allergies     Latest Ref Rng & Units 10/31/2022    8:55 AM 07/29/2022    9:35 AM 09/16/2020    5:48 AM  CBC  WBC 3.4 - 10.8 x10E3/uL 7.5  7.9  5.9   Hemoglobin 13.0 - 17.7 g/dL 09.8  11.9  14.7   Hematocrit 37.5 - 51.0 % 43.6  41.6  40.9   Platelets 150 - 400 K/uL   154       CMP     Component Value Date/Time   NA 139 10/31/2022 0855   NA 139 10/11/2014 1129   K 4.5 10/31/2022 0855   K 4.2 10/11/2014 1129   CL 101 10/31/2022 0855   CL 106 10/11/2014 1129   CO2 24 10/31/2022 0855   CO2 25 10/11/2014 1129   GLUCOSE 121 (H) 10/31/2022 0855   GLUCOSE 87 09/16/2020 0548   GLUCOSE 108 (H) 10/11/2014 1129   BUN 17 10/31/2022 0855   BUN 21 (H) 10/11/2014 1129   CREATININE 1.06 10/31/2022 0855   CREATININE 0.94 10/11/2014 1129   CALCIUM 9.1 10/31/2022 0855   CALCIUM 8.9 10/11/2014 1129   PROT 6.3 10/31/2022 0855   ALBUMIN 4.3 10/31/2022 0855   AST 11 10/31/2022 0855   ALT 22 10/31/2022 0855   ALKPHOS 32 (L) 10/31/2022 0855   BILITOT 0.4 10/31/2022 0855   EGFR 73 10/31/2022 0855   GFRNONAA >60 09/16/2020 0548   GFRNONAA >60 10/11/2014 1129     No results found.     Assessment & Plan:   1. Numbness and tingling Recommend:  The patient has atypical pain symptoms for  vascular disease and on exam I do not find evidence of vascular pathology that would explain the patient's symptoms.  Noninvasive studies do not identify significant vascular problems  I suspect the patient is complaining of underlying neuropathy.  This is addressed below.   2. Primary hypertension Continue antihypertensive medications as already ordered, these medications have been reviewed and there are no changes at this time.  3. Neuropathy On the patient's description of symptoms, I suspect that he has some underlying neuropathy.  Whether this is related to his lower back, is not entirely clear but we will send the patient to neurology for evaluation.  4. Varicose veins of both lower extremities with inflammation The patient does have notable varicosities in his bilateral lower extremities.  This can certainly be contributing to the patient's lower extremity edema which can increase inflammation and at times worsen underlying neuropathy.  Will have him return at his convenience for venous reflux studies.  We discussed the use of medical grade compression, elevation and activity help with lower extremity edema   Current Outpatient Medications on File Prior to Visit  Medication Sig Dispense Refill   allopurinol (ZYLOPRIM) 300 MG tablet TAKE 1 TABLET BY MOUTH EVERY DAY (AFTER MEAL WITH LARGE GLASS OF WATER) 90 tablet 3   amLODipine (NORVASC) 2.5 MG tablet Take 2.5 mg by mouth daily.     celecoxib (CELEBREX) 200 MG capsule Take 1 capsule (200 mg total) by mouth 2 (two) times daily. 60 capsule 2   finasteride (PROSCAR) 5 MG tablet TAKE 1 TABLET BY MOUTH EVERY DAY 90 tablet 3   gabapentin (NEURONTIN) 300 MG capsule Take 300 mg by mouth 2 (two) times daily.     losartan (COZAAR) 100 MG tablet TAKE 1 TABLET BY MOUTH EVERY DAY IN THE MORNING 90 tablet 3   meloxicam (MOBIC) 15 MG tablet Take 15 mg by mouth daily as needed.     metoprolol tartrate (LOPRESSOR) 50 MG tablet TAKE 1 TABLET BY MOUTH  TWICE A DAY 180 tablet 3   oxybutynin (DITROPAN-XL) 10 MG 24 hr tablet Take 1 tablet (10 mg total) by mouth daily. 30 tablet 11   traZODone (DESYREL) 150 MG tablet TAKE 1 TABLET BY MOUTH EVERYDAY AT BEDTIME 90 tablet 3   tirzepatide (ZEPBOUND) 2.5 MG/0.5ML Pen Inject 2.5 mg into the skin once a week. (Patient not taking: Reported on 12/11/2022) 2 mL 2   No current facility-administered medications on file prior to visit.    There are no Patient Instructions on file for this visit. No follow-ups on file.   Georgiana Spinner, NP

## 2022-12-12 ENCOUNTER — Other Ambulatory Visit (INDEPENDENT_AMBULATORY_CARE_PROVIDER_SITE_OTHER): Payer: Self-pay | Admitting: Nurse Practitioner

## 2022-12-12 DIAGNOSIS — I8311 Varicose veins of right lower extremity with inflammation: Secondary | ICD-10-CM

## 2022-12-15 ENCOUNTER — Ambulatory Visit: Payer: Medicare HMO | Admitting: Urology

## 2022-12-15 ENCOUNTER — Encounter: Payer: Self-pay | Admitting: Urology

## 2022-12-15 VITALS — BP 152/81 | HR 60 | Ht 66.0 in | Wt 289.0 lb

## 2022-12-15 DIAGNOSIS — N3281 Overactive bladder: Secondary | ICD-10-CM

## 2022-12-15 DIAGNOSIS — N5089 Other specified disorders of the male genital organs: Secondary | ICD-10-CM

## 2022-12-15 NOTE — Progress Notes (Signed)
   12/15/2022 2:13 PM   Isaiah Walker August 12, 1945 [id number]  Reason for visit: Follow up right scrotal swelling/hydrocele, overactive bladder, nephrolithiasis  HPI: 77 year old male who previously was followed by Dr. Apolinar Junes and Dr. Sherryl Barters, who I saw last in November 2023 for the above issues.  Was recently seen by Carman Ching, PA on 11/21/2022 and reported some increased right scrotal swelling and a scrotal ultrasound was ordered.  I personally viewed and interpreted the scrotal ultrasound dated 12/08/2022 showing an enlarged right testicle measuring 4.5 cm with no solid mass or evidence of malignancy but significant tubular ectasia of the Rete testes which is benign, left testicle measures 2.9 cm, there is a small right-sided hydrocele.  On exam, there is some mild to moderate right scrotal swelling, this appears more related to an enlarged right testicle as opposed to hydrocele.  He has had long-term intermittent problems with the right testicle since his vasectomy with intermittent discomfort and groin pain.  We had a very long conversation today about options including observation/surveillance, hydrocele aspiration, or hydrocelectomy.  Based on his scrotal ultrasound and exam, I think his right-sided swelling is more related to a chronically enlarged right testicle as opposed to the small hydrocele, and I am not sure he would benefit significantly from hydrocelectomy.  I recommended observation, and he was in agreement.  Could always reconsider hydrocelectomy if enlarging or more bothersome in the future.  We discussed strategies of icing as needed, NSAIDs, snug fitting underwear, as well as weight loss with his BMI of 47 which likely contributes to some of his discomfort.  In terms of his bladder symptoms, he has been on finasteride long-term, and oxybutynin, he has some urgency in the morning but otherwise the oxybutynin seems to work well.  We reviewed behavioral strategies and avoiding  bladder irritants, could consider taking the oxybutynin in the evening instead of the morning to see if this helps more, could consider trial of beta 3 agonist in the future as well.  RTC 9 months symptom check  Sondra Come, MD  [facility] [address], [address] [place], [place] [address] [phone]

## 2022-12-15 NOTE — Patient Instructions (Signed)
Hydrocele, Adult A hydrocele is a collection of fluid in the loose pouch of skin that holds the testicles (scrotum). It can occur in one or both testicles. This may happen because: The amount of fluid produced in the scrotum is not absorbed by the rest of the body. Fluid from the abdomen fills the scrotum. Normally, the testicles develop in the abdomen and then drop into the scrotum before birth. The tube that the testicles travel through usually closes after the testicles drop. If the tube does not close, fluid from the abdomen can fill the scrotum. This is not very common in adults. What are the causes? A hydrocele may be caused by: An injury to the scrotum. An infection. Decreased blood flow to the scrotum. Twisting of a testicle (testicular torsion). A birth defect. A tumor or cancer of the testicle. Sometimes, the cause is not known. What are the signs or symptoms? A hydrocele feels like a water-filled balloon. It may also feel heavy. Other symptoms include: Swelling of the scrotum. The swelling may decrease when you lie down. You may also notice more swelling at night than in the morning. This is called a communicating hydrocele, in which the fluid in the scrotum goes back into the abdominal cavity when the position of the scrotum changes. Swelling of the groin. Mild discomfort in the scrotum. Pain. This can develop if the hydrocele was caused by infection or twisting. The larger the hydrocele, the more likely you are to have pain. Swelling may also cause pain. How is this diagnosed? This condition may be diagnosed based on a physical exam and your medical history. You may also have tests, including: Imaging tests, such as an ultrasound. A transillumination test. This test takes place in a dark room where a light is placed on the skin of the scrotum. Clear liquid will not impede the light and the scrotum will be illuminated. This helps a health care provider distinguish a hydrocele from a  tumor. Blood or urine tests. How is this treated? Most hydroceles go away on their own. If you have no discomfort or pain, your health care provider may suggest close monitoring of your condition until the condition goes away or symptoms develop. This is called watch and wait or watchful waiting. If treatment is needed, it may include: Treating an underlying condition. This may include taking an antibiotic medicine to treat an infection. Having surgery to stop fluid from collecting in the scrotum. Having surgery to drain the fluid. Surgery may include: Hydrocelectomy. For this procedure, an incision is made in the scrotum to remove the fluid. Needle aspiration. A needle is used to drain fluid. However, the fluid buildup will come back quickly and may lead to an infection of the scrotum. This is rarely done. Follow these instructions at home: Medicines Take over-the-counter and prescription medicines only as told by your health care provider. If you were prescribed an antibiotic medicine, take it as told by your health care provider. Do not stop taking the antibiotic even if you start to feel better. General instructions Watch the hydrocele for any changes. Keep all follow-up visits. This is important. Contact a health care provider if: You notice any changes in the hydrocele. The swelling in your scrotum or groin gets worse. The hydrocele becomes red, firm, painful, or tender to the touch. You have a fever. Get help right away if you: Develop a lot of pain or your pain becomes worse. Have chills. Have a high fever. Summary A hydrocele is  a collection of fluid in the loose pouch of skin that holds the testicles (scrotum). A hydrocele can cause swelling, discomfort, and pain. In adults, the cause of a hydrocele may not be known. However, it is sometimes caused by an infection or the twisting of a testicle. Hydroceles often go away on their own. If a hydrocele causes pain, treating the  underlying cause may be needed to ease the pain. This information is not intended to replace advice given to you by your health care provider. Make sure you discuss any questions you have with your health care provider. Document Revised: 01/17/2021 Document Reviewed: 01/17/2021 Elsevier Patient Education  2024 [employer]  Hydrocelectomy, Adult  A hydrocelectomy is a surgical procedure to remove a collection of fluid (hydrocele) from the scrotum. The scrotum is the pouch that holds the testicles. You may need to have this procedure if a hydrocele is causing painful swelling in your scrotum. Tell a health care provider about: Any allergies you have. All medicines you are taking, including vitamins, herbs, eye drops, creams, and over-the-counter medicines. Any problems you or family members have had with anesthetic medicines. Any bleeding problems you have. Any surgeries you have had. Any medical conditions you have. What are the risks? Generally, this is a safe procedure. However, problems may occur, including: Bleeding into the scrotum (scrotal hematoma). Damage to nearby structures or organs, including to the testicle or the tube that carries sperm out of the testicle (vas deferens). Infection. Allergic reactions to medicines. What happens before the procedure? When to stop eating and drinking Follow instructions from your health care provider about what you may eat and drink before your procedure. These may include: 8 hours before your procedure Stop eating most foods. Do not eat meat, fried foods, or fatty foods. Eat only light foods, such as toast or crackers. All liquids are okay except energy drinks and alcohol. 6 hours before your procedure Stop eating. Drink only clear liquids, such as water, clear fruit juice, black coffee, plain tea, and sports drinks. Do not drink energy drinks or alcohol. 2 hours before your procedure Stop drinking all liquids. You may be allowed to  take medicines with small sips of water. If you do not follow your health care provider's instructions, your procedure may be delayed or canceled. Medicines Ask your health care provider about: Changing or stopping your regular medicines. This is especially important if you are taking diabetes medicines or blood thinners. Taking medicines such as aspirin and ibuprofen. These medicines can thin your blood. Do not take these medicines unless your health care provider tells you to take them. Taking over-the-counter medicines, vitamins, herbs, and supplements. Surgery safety Ask your health care provider: How your surgery site will be marked. What steps will be taken to help prevent infection. These steps may include: Removing hair at the surgery site. Washing skin with a germ-killing soap. Taking antibiotic medicine. General instructions Do not use any products that contain nicotine or tobacco for at least 4 weeks before the procedure. These products include cigarettes, chewing tobacco, and vaping devices, such as e-cigarettes. If you need help quitting, ask your health care provider. If you will be going home right after the procedure, plan to have a responsible adult: Take you home from the hospital or clinic. You will not be allowed to drive. Care for you for the time you are told. What happens during the procedure? An IV will be inserted into one of your veins. You will be given one  or both of the following: A medicine to make you relax (sedative). A medicine to make you fall asleep (general anesthetic). A small incision will be made through the skin of your scrotum. Your testicle and the hydrocele will be located, and the hydrocele sac will be opened with an incision. The fluid will be drained from the hydrocele. Part of the hydrocele sac may be removed. The hydrocele will be closed with stitches that dissolve (absorbable sutures). This prevents fluid from building up again. If your  hydrocele is large, you may have a thin, rubber drain placed to allow fluid to drain after the procedure. The incision in your scrotum will be closed with absorbable sutures, skin glue, or adhesive strips. A bandage (dressing) will be placed over the incision. The dressing may be held in place with an athletic support strap (scrotal support). The procedure may vary among health care providers and hospitals. What happens after the procedure?  Your blood pressure, heart rate, breathing rate, and blood oxygen level will be monitored until you leave the hospital or clinic. You will be given pain medicine as needed. Your IV will be removed, and your insertion site will be checked for bleeding. You may need to wear a scrotal support. This holds the dressing in place and supports your scrotum. If you were given a sedative during the procedure, it can affect you for several hours. Do not drive or operate machinery until your health care provider says that it is safe. Summary A hydrocelectomy is a surgical procedure to remove a collection of fluid from the scrotum. You may need to have this procedure if a hydrocele is causing painful swelling in your scrotum. During the procedure, the hydrocele will be drained and then closed with stitches that dissolve (absorbable sutures). This prevents fluid from building up again. If your hydrocele is large, you may have a thin, rubber drain placed to allow fluid to drain after the procedure. You may need to wear a scrotal support after your procedure. This holds the dressing in place and supports your scrotum. This information is not intended to replace advice given to you by your health care provider. Make sure you discuss any questions you have with your health care provider. Document Revised: 01/17/2021 Document Reviewed: 01/17/2021 Elsevier Patient Education  2024 [employer].

## 2022-12-16 ENCOUNTER — Encounter (INDEPENDENT_AMBULATORY_CARE_PROVIDER_SITE_OTHER): Payer: Self-pay | Admitting: Nurse Practitioner

## 2022-12-16 ENCOUNTER — Ambulatory Visit (INDEPENDENT_AMBULATORY_CARE_PROVIDER_SITE_OTHER): Payer: Medicare HMO

## 2022-12-16 ENCOUNTER — Ambulatory Visit (INDEPENDENT_AMBULATORY_CARE_PROVIDER_SITE_OTHER): Payer: Medicare HMO | Admitting: Nurse Practitioner

## 2022-12-16 VITALS — BP 146/74 | HR 65 | Resp 16 | Wt 288.0 lb

## 2022-12-16 DIAGNOSIS — G629 Polyneuropathy, unspecified: Secondary | ICD-10-CM

## 2022-12-16 DIAGNOSIS — I8311 Varicose veins of right lower extremity with inflammation: Secondary | ICD-10-CM

## 2022-12-16 DIAGNOSIS — I8312 Varicose veins of left lower extremity with inflammation: Secondary | ICD-10-CM

## 2022-12-16 DIAGNOSIS — R200 Anesthesia of skin: Secondary | ICD-10-CM | POA: Diagnosis not present

## 2022-12-16 DIAGNOSIS — R202 Paresthesia of skin: Secondary | ICD-10-CM | POA: Diagnosis not present

## 2022-12-16 DIAGNOSIS — I10 Essential (primary) hypertension: Secondary | ICD-10-CM | POA: Diagnosis not present

## 2022-12-16 DIAGNOSIS — M7550 Bursitis of unspecified shoulder: Secondary | ICD-10-CM | POA: Insufficient documentation

## 2022-12-16 DIAGNOSIS — M719 Bursopathy, unspecified: Secondary | ICD-10-CM | POA: Insufficient documentation

## 2022-12-17 ENCOUNTER — Encounter (INDEPENDENT_AMBULATORY_CARE_PROVIDER_SITE_OTHER): Payer: Self-pay | Admitting: Nurse Practitioner

## 2022-12-17 ENCOUNTER — Ambulatory Visit: Payer: Medicare HMO | Admitting: Urology

## 2022-12-17 NOTE — Progress Notes (Signed)
Subjective:    Patient ID: Isaiah Walker, male    DOB: 05-14-1946, 77 y.o.   MRN: [id number] Chief Complaint  Patient presents with   Follow-up    Ultrasound follow up    Council Pencek is a 77 year old male that presents today for evaluation of lower extremity numbness.  He has had issues with lower extremity numbness for some time now.  He notes that it gets worse when he is at rest.  He also endorses having lower extremity edema.  He denies any open wounds or ulcerations.  He denies symptoms that are consistent with claudication-like symptoms.  He denies rest pain like symptoms.  Also has history of lower back issues.  Previously, noninvasive studies show an ABI of 1.23 on the right and 1.21 on the left.  Patient has normal TBI's bilaterally.  He has strong triphasic tibial artery waveforms bilaterally with normal toe waveforms bilaterally.  We also evaluated the patient's face related time and if venous disease may be exacerbating the numbness however today it seems that he has no evidence of deep venous insufficiency or superficial venous reflux bilaterally.    Review of Systems  Cardiovascular:  Positive for leg swelling.  Neurological:  Positive for numbness.  All other systems reviewed and are negative.      Objective:   Physical Exam Vitals reviewed.  HENT:     Head: Normocephalic.  Cardiovascular:     Rate and Rhythm: Normal rate.     Pulses:          Dorsalis pedis pulses are detected w/ Doppler on the right side and detected w/ Doppler on the left side.       Posterior tibial pulses are detected w/ Doppler on the right side and detected w/ Doppler on the left side.  Pulmonary:     Effort: Pulmonary effort is normal.  Musculoskeletal:     Right lower leg: Edema present.     Left lower leg: Edema present.  Skin:    General: Skin is warm and dry.  Neurological:     Mental Status: He is alert and oriented to person, place, and time.  Psychiatric:        Mood and Affect:  Mood normal.        Behavior: Behavior normal.        Thought Content: Thought content normal.        Judgment: Judgment normal.     BP (!) 146/74 (BP Location: Right Arm)   Pulse 65   Resp 16   Wt 288 lb (130.6 kg)   BMI 46.48 kg/m   Past Medical History:  Diagnosis Date   Arthritis    BPH (benign prostatic hyperplasia)    Crohn's disease (HCC)    Hypertension    Kidney stones     Social History   Socioeconomic History   Marital status: Married    Spouse name: Not on file   Number of children: Not on file   Years of education: Not on file   Highest education level: Not on file  Occupational History   Not on file  Tobacco Use   Smoking status: Never    Passive exposure: Never   Smokeless tobacco: Never  Vaping Use   Vaping Use: Never used  Substance and Sexual Activity   Alcohol use: No   Drug use: No   Sexual activity: Not Currently  Other Topics Concern   Not on file  Social History Narrative  Not on file   Social Determinants of Health   Financial Resource Strain: Not on file  Food Insecurity: Not on file  Transportation Needs: Not on file  Physical Activity: Not on file  Stress: Not on file  Social Connections: Not on file  Intimate Partner Violence: Not on file    Past Surgical History:  Procedure Laterality Date   CHOLECYSTECTOMY     CHOLECYSTECTOMY, LAPAROSCOPIC N/A    COLON SURGERY     Colectomy 2005   COLONOSCOPY N/A 01/11/2019   Procedure: COLONOSCOPY;  Surgeon: Pasty Spillers, MD;  Location: [facility] ENDOSCOPY;  Service: Endoscopy;  Laterality: N/A;   COLONOSCOPY WITH PROPOFOL N/A 02/11/2019   Procedure: COLONOSCOPY WITH PROPOFOL;  Surgeon: Toney Reil, MD;  Location: [facility] ENDOSCOPY;  Service: Gastroenterology;  Laterality: N/A;  Pediatric endoscope   CYSTOSCOPY W/ RETROGRADES Bilateral 04/01/2017   Procedure: CYSTOSCOPY WITH RETROGRADE PYELOGRAM;  Surgeon: Vanna Scotland, MD;  Location: [facility] ORS;  Service: Urology;   Laterality: Bilateral;   CYSTOSCOPY WITH STENT PLACEMENT Bilateral 04/01/2017   Procedure: CYSTOSCOPY WITH STENT PLACEMENT;  Surgeon: Vanna Scotland, MD;  Location: [facility] ORS;  Service: Urology;  Laterality: Bilateral;   CYSTOSCOPY/URETEROSCOPY/HOLMIUM LASER/STENT PLACEMENT Left 10/16/2014   Procedure: CYSTOSCOPY/URETEROSCOPY/HOLMIUM LASER/STENT PLACEMENT;  Surgeon: Vanna Scotland, MD;  Location: [facility];  Service: Urology;  Laterality: Left;   CYSTOSCOPY/URETEROSCOPY/HOLMIUM LASER/STENT PLACEMENT Bilateral 04/20/2017   Procedure: CYSTOSCOPY/URETEROSCOPY/HOLMIUM LASER/STENT EXCHANGE;  Surgeon: Vanna Scotland, MD;  Location: [facility] ORS;  Service: Urology;  Laterality: Bilateral;   HERNIA REPAIR     Percutaneous Left    Nephrolithotomy    Family History  Problem Relation Age of Onset   Parkinson's disease Mother    Heart disease Father    Lymphoma Brother    Lung cancer Brother    Heart disease Brother     No Known Allergies     Latest Ref Rng & Units 10/31/2022    8:55 AM 07/29/2022    9:35 AM 09/16/2020    5:48 AM  CBC  WBC 3.4 - 10.8 x10E3/uL 7.5  7.9  5.9   Hemoglobin 13.0 - 17.7 g/dL 16.1  09.6  04.5   Hematocrit 37.5 - 51.0 % 43.6  41.6  40.9   Platelets 150 - 400 K/uL   154       CMP     Component Value Date/Time   NA 139 10/31/2022 0855   NA 139 10/11/2014 1129   K 4.5 10/31/2022 0855   K 4.2 10/11/2014 1129   CL 101 10/31/2022 0855   CL 106 10/11/2014 1129   CO2 24 10/31/2022 0855   CO2 25 10/11/2014 1129   GLUCOSE 121 (H) 10/31/2022 0855   GLUCOSE 87 09/16/2020 0548   GLUCOSE 108 (H) 10/11/2014 1129   BUN 17 10/31/2022 0855   BUN 21 (H) 10/11/2014 1129   CREATININE 1.06 10/31/2022 0855   CREATININE 0.94 10/11/2014 1129   CALCIUM 9.1 10/31/2022 0855   CALCIUM 8.9 10/11/2014 1129   PROT 6.3 10/31/2022 0855   ALBUMIN 4.3 10/31/2022 0855   AST 11 10/31/2022 0855   ALT 22 10/31/2022 0855   ALKPHOS 32 (L) 10/31/2022 0855   BILITOT 0.4 10/31/2022 0855   EGFR  73 10/31/2022 0855   GFRNONAA >60 09/16/2020 0548   GFRNONAA >60 10/11/2014 1129     No results found.     Assessment & Plan:   1. Numbness and tingling Recommend:  The patient has atypical pain symptoms for vascular disease and on exam  I do not find evidence of vascular pathology that would explain the patient's symptoms.  Noninvasive studies do not identify significant vascular problems  I suspect the patient is complaining of underlying neuropathy.  This is addressed below.   2. Primary hypertension Continue antihypertensive medications as already ordered, these medications have been reviewed and there are no changes at this time.  3. Neuropathy On the patient's description of symptoms, I suspect that he has some underlying neuropathy.  Whether this is related to his lower back, is not entirely clear but we will send the patient to neurology for evaluation.  4. Varicose veins of both lower extremities with inflammation Patient has no evidence of significant venous insufficiency but does have some swelling in the legs.  We discussed conservative therapy for swelling including knee-high compression, elevation and activity.  Will have patient follow-up with [place] on an as-needed basis   Current Outpatient Medications on File Prior to Visit  Medication Sig Dispense Refill   allopurinol (ZYLOPRIM) 300 MG tablet TAKE 1 TABLET BY MOUTH EVERY DAY (AFTER MEAL WITH LARGE GLASS OF WATER) 90 tablet 3   amLODipine (NORVASC) 2.5 MG tablet Take 2.5 mg by mouth daily.     celecoxib (CELEBREX) 200 MG capsule Take 1 capsule (200 mg total) by mouth 2 (two) times daily. 60 capsule 2   finasteride (PROSCAR) 5 MG tablet TAKE 1 TABLET BY MOUTH EVERY DAY 90 tablet 3   gabapentin (NEURONTIN) 300 MG capsule Take 300 mg by mouth 2 (two) times daily.     losartan (COZAAR) 100 MG tablet TAKE 1 TABLET BY MOUTH EVERY DAY IN THE MORNING 90 tablet 3   meloxicam (MOBIC) 15 MG tablet Take 15 mg by mouth daily as  needed.     metoprolol tartrate (LOPRESSOR) 50 MG tablet TAKE 1 TABLET BY MOUTH TWICE A DAY 180 tablet 3   oxybutynin (DITROPAN-XL) 10 MG 24 hr tablet Take 1 tablet (10 mg total) by mouth daily. 30 tablet 11   tirzepatide (ZEPBOUND) 2.5 MG/0.5ML Pen Inject 2.5 mg into the skin once a week. 2 mL 2   traZODone (DESYREL) 150 MG tablet TAKE 1 TABLET BY MOUTH EVERYDAY AT BEDTIME 90 tablet 3   No current facility-administered medications on file prior to visit.    There are no Patient Instructions on file for this visit. No follow-ups on file.   Georgiana Spinner, NP

## 2022-12-23 ENCOUNTER — Other Ambulatory Visit: Payer: Self-pay | Admitting: Nurse Practitioner

## 2023-01-12 ENCOUNTER — Ambulatory Visit: Payer: Medicare HMO | Admitting: Family

## 2023-01-12 ENCOUNTER — Encounter: Payer: Self-pay | Admitting: Family

## 2023-01-12 VITALS — BP 134/76 | HR 58 | Ht 66.0 in | Wt 295.4 lb

## 2023-01-12 DIAGNOSIS — G629 Polyneuropathy, unspecified: Secondary | ICD-10-CM | POA: Diagnosis not present

## 2023-01-12 DIAGNOSIS — N401 Enlarged prostate with lower urinary tract symptoms: Secondary | ICD-10-CM

## 2023-01-12 DIAGNOSIS — R7303 Prediabetes: Secondary | ICD-10-CM | POA: Diagnosis not present

## 2023-01-12 DIAGNOSIS — K50919 Crohn's disease, unspecified, with unspecified complications: Secondary | ICD-10-CM | POA: Diagnosis not present

## 2023-01-12 DIAGNOSIS — R351 Nocturia: Secondary | ICD-10-CM | POA: Diagnosis not present

## 2023-01-12 DIAGNOSIS — M1000 Idiopathic gout, unspecified site: Secondary | ICD-10-CM

## 2023-01-12 DIAGNOSIS — I10 Essential (primary) hypertension: Secondary | ICD-10-CM

## 2023-01-12 DIAGNOSIS — N400 Enlarged prostate without lower urinary tract symptoms: Secondary | ICD-10-CM

## 2023-01-12 MED ORDER — MOUNJARO 5 MG/0.5ML ~~LOC~~ SOAJ: 5.0000 mg | SUBCUTANEOUS | 3 refills | Status: DC

## 2023-01-12 MED ORDER — LOSARTAN POTASSIUM 100 MG PO TABS
100.0000 mg | ORAL_TABLET | Freq: Every day | ORAL | 3 refills | Status: DC
Start: 2023-01-12 — End: 2024-02-19

## 2023-01-12 MED ORDER — AMLODIPINE BESYLATE 2.5 MG PO TABS
2.5000 mg | ORAL_TABLET | Freq: Every day | ORAL | 3 refills | Status: DC
Start: 2023-01-12 — End: 2023-12-09

## 2023-01-12 MED ORDER — METOPROLOL TARTRATE 50 MG PO TABS
50.0000 mg | ORAL_TABLET | Freq: Two times a day (BID) | ORAL | 3 refills | Status: DC
Start: 2023-01-12 — End: 2024-02-19

## 2023-01-12 MED ORDER — ALLOPURINOL 300 MG PO TABS: 300.0000 mg | ORAL_TABLET | Freq: Every day | ORAL | 3 refills | Status: DC

## 2023-01-12 MED ORDER — TRAZODONE HCL 150 MG PO TABS
150.0000 mg | ORAL_TABLET | Freq: Every evening | ORAL | 3 refills | Status: DC | PRN
Start: 2023-01-12 — End: 2024-02-10

## 2023-01-12 MED ORDER — CELECOXIB 200 MG PO CAPS
200.0000 mg | ORAL_CAPSULE | Freq: Two times a day (BID) | ORAL | 2 refills | Status: DC
Start: 2023-01-12 — End: 2023-09-30

## 2023-01-12 MED ORDER — FINASTERIDE 5 MG PO TABS
5.0000 mg | ORAL_TABLET | Freq: Every day | ORAL | 3 refills | Status: DC
Start: 2023-01-12 — End: 2023-09-17

## 2023-01-12 MED ORDER — OXYBUTYNIN CHLORIDE ER 10 MG PO TB24
10.0000 mg | ORAL_TABLET | Freq: Every day | ORAL | 1 refills | Status: DC
Start: 2023-01-12 — End: 2023-02-19

## 2023-01-12 MED ORDER — GABAPENTIN 300 MG PO CAPS
300.0000 mg | ORAL_CAPSULE | Freq: Two times a day (BID) | ORAL | 1 refills | Status: DC
Start: 2023-01-12 — End: 2023-12-21

## 2023-01-12 NOTE — Progress Notes (Signed)
Established Patient Office Visit  Subjective:  Patient ID: Isaiah Walker, male    DOB: 07-06-1945  Age: 77 y.o. MRN: [id number]  Chief Complaint  Patient presents with   Follow-up    Follow up    Patient is here today for his 3 months follow up.  He has been feeling fairly well since last appointment.   He does have additional concerns to discuss today.  Would like to check about getting the mounjaro again, he was not able to get it, because he never got a prior authorization done for it (we did not ever see from the pharmacy that he needed one).   Labs are not due today. He needs refills.   I have reviewed his active problem list, medication list, allergies, notes from last encounter, lab results for his appointment today.      No other concerns at this time.   Past Medical History:  Diagnosis Date   Arthritis    BPH (benign prostatic hyperplasia)    Crohn's disease (HCC)    Hypertension    Kidney stones    Partial small bowel obstruction (HCC) 09/13/2020    Past Surgical History:  Procedure Laterality Date   CHOLECYSTECTOMY     CHOLECYSTECTOMY, LAPAROSCOPIC N/A    COLON SURGERY     Colectomy 2005   COLONOSCOPY N/A 01/11/2019   Procedure: COLONOSCOPY;  Surgeon: Pasty Spillers, MD;  Location: [facility] ENDOSCOPY;  Service: Endoscopy;  Laterality: N/A;   COLONOSCOPY WITH PROPOFOL N/A 02/11/2019   Procedure: COLONOSCOPY WITH PROPOFOL;  Surgeon: Toney Reil, MD;  Location: [facility] ENDOSCOPY;  Service: Gastroenterology;  Laterality: N/A;  Pediatric endoscope   CYSTOSCOPY W/ RETROGRADES Bilateral 04/01/2017   Procedure: CYSTOSCOPY WITH RETROGRADE PYELOGRAM;  Surgeon: Vanna Scotland, MD;  Location: [facility] ORS;  Service: Urology;  Laterality: Bilateral;   CYSTOSCOPY WITH STENT PLACEMENT Bilateral 04/01/2017   Procedure: CYSTOSCOPY WITH STENT PLACEMENT;  Surgeon: Vanna Scotland, MD;  Location: [facility];  Service: Urology;  Laterality: Bilateral;    CYSTOSCOPY/URETEROSCOPY/HOLMIUM LASER/STENT PLACEMENT Left 10/16/2014   Procedure: CYSTOSCOPY/URETEROSCOPY/HOLMIUM LASER/STENT PLACEMENT;  Surgeon: Vanna Scotland, MD;  Location: [facility];  Service: Urology;  Laterality: Left;   CYSTOSCOPY/URETEROSCOPY/HOLMIUM LASER/STENT PLACEMENT Bilateral 04/20/2017   Procedure: CYSTOSCOPY/URETEROSCOPY/HOLMIUM LASER/STENT EXCHANGE;  Surgeon: Vanna Scotland, MD;  Location: [facility] OR[facility];  Service: Urology;  Laterality: Bilateral;   HERNIA REPAIR     Percutaneous Left    Nephrolithotomy    Social History   Socioeconomic History   Marital status: Married    Spouse name: Not on file   Number of children: Not on file   Years of education: Not on file   Highest education level: Not on file  Occupational History   Not on file  Tobacco Use   Smoking status: Never    Passive exposure: Never   Smokeless tobacco: Never  Vaping Use   Vaping status: Never Used  Substance and Sexual Activity   Alcohol use: No   Drug use: No   Sexual activity: Not Currently  Other Topics Concern   Not on file  Social History Narrative   Not on file   Social Determinants of Health   Financial Resource Strain: Not on file  Food Insecurity: Not on file  Transportation Needs: Not on file  Physical Activity: Not on file  Stress: Not on file  Social Connections: Not on file  Intimate Partner Violence: Not on file    Family History  Problem Relation Age of Onset   Parkinson's  disease Mother    Heart disease Father    Lymphoma Brother    Lung cancer Brother    Heart disease Brother     No Known Allergies  Review of Systems  All other systems reviewed and are negative.      Objective:   BP 134/76   Pulse (!) 58   Ht 5\' 6"  (1.676 m)   Wt 295 lb 6.4 oz (134 kg)   SpO2 96%   BMI 47.68 kg/m   Vitals:   01/12/23 1306  BP: 134/76  Pulse: (!) 58  Height: 5\' 6"  (1.676 m)  Weight: 295 lb 6.4 oz (134 kg)  SpO2: 96%  BMI (Calculated): 47.7    Physical  Exam Vitals and nursing note reviewed.  Constitutional:      Appearance: Normal appearance. He is obese.  Eyes:     Extraocular Movements: Extraocular movements intact.     Conjunctiva/sclera: Conjunctivae normal.     Pupils: Pupils are equal, round, and reactive to light.  Cardiovascular:     Rate and Rhythm: Normal rate and regular rhythm.     Pulses: Normal pulses.     Heart sounds: Normal heart sounds.  Pulmonary:     Effort: Pulmonary effort is normal.     Breath sounds: Normal breath sounds.  Neurological:     General: No focal deficit present.     Mental Status: He is alert and oriented to person, place, and time. Mental status is at baseline.  Psychiatric:        Mood and Affect: Mood normal.        Behavior: Behavior normal.        Thought Content: Thought content normal.        Judgment: Judgment normal.      No results found for any visits on 01/12/23.  Recent Results (from the past 2160 hour(s))  Hemoglobin A1c     Status: Abnormal   Collection Time: 10/31/22  8:55 AM  Result Value Ref Range   Hgb A1c MFr Bld 5.8 (H) 4.8 - 5.6 %    Comment:          Prediabetes: 5.7 - 6.4          Diabetes: >6.4          Glycemic control for adults with diabetes: <7.0    Est. average glucose Bld gHb Est-mCnc 120 mg/dL  TSH     Status: None   Collection Time: 10/31/22  8:55 AM  Result Value Ref Range   TSH 3.840 0.450 - 4.500 uIU/mL  CMP14+EGFR     Status: Abnormal   Collection Time: 10/31/22  8:55 AM  Result Value Ref Range   Glucose 121 (H) 70 - 99 mg/dL   BUN 17 8 - 27 mg/dL   Creatinine, Ser 5.62 0.76 - 1.27 mg/dL   eGFR 73 >13 YQ/MVH/8.46   BUN/Creatinine Ratio 16 10 - 24   Sodium 139 134 - 144 mmol/L   Potassium 4.5 3.5 - 5.2 mmol/L   Chloride 101 96 - 106 mmol/L   CO2 24 20 - 29 mmol/L   Calcium 9.1 8.6 - 10.2 mg/dL   Total Protein 6.3 6.0 - 8.5 g/dL   Albumin 4.3 3.8 - 4.8 g/dL   Globulin, Total 2.0 1.5 - 4.5 g/dL   Albumin/Globulin Ratio 2.2 1.2 - 2.2    Bilirubin Total 0.4 0.0 - 1.2 mg/dL   Alkaline Phosphatase 32 (L) 44 - 121 IU/L   AST 11  0 - 40 IU/L   ALT 22 0 - 44 IU/L  Lipid panel     Status: None   Collection Time: 10/31/22  8:55 AM  Result Value Ref Range   Cholesterol, Total 141 100 - 199 mg/dL   Triglycerides 86 0 - 149 mg/dL   HDL 42 >16 mg/dL   VLDL Cholesterol Cal 17 5 - 40 mg/dL   LDL Chol Calc (NIH) 82 0 - 99 mg/dL   Chol/HDL Ratio 3.4 0.0 - 5.0 ratio    Comment:                                   T. Chol/HDL Ratio                                             Men  Women                               1/2 Avg.Risk  3.4    3.3                                   Avg.Risk  5.0    4.4                                2X Avg.Risk  9.6    7.1                                3X Avg.Risk 23.4   11.0   CBC With Differential     Status: Abnormal   Collection Time: 10/31/22  8:55 AM  Result Value Ref Range   WBC 7.5 3.4 - 10.8 x10E3/uL   RBC 4.82 4.14 - 5.80 x10E6/uL   Hemoglobin 14.3 13.0 - 17.7 g/dL   Hematocrit 10.9 60.4 - 51.0 %   MCV 91 79 - 97 fL   MCH 29.7 26.6 - 33.0 pg   MCHC 32.8 31.5 - 35.7 g/dL   RDW 54.0 98.1 - 19.1 %   Neutrophils 63 Not Estab. %   Lymphs 18 Not Estab. %   Monocytes 14 Not Estab. %   Eos 2 Not Estab. %   Basos 1 Not Estab. %   Neutrophils Absolute 4.8 1.4 - 7.0 x10E3/uL   Lymphocytes Absolute 1.3 0.7 - 3.1 x10E3/uL   Monocytes Absolute 1.1 (H) 0.1 - 0.9 x10E3/uL   EOS (ABSOLUTE) 0.2 0.0 - 0.4 x10E3/uL   Basophils Absolute 0.0 0.0 - 0.2 x10E3/uL   Immature Granulocytes 2 Not Estab. %   Immature Grans (Abs) 0.1 0.0 - 0.1 x10E3/uL  Urinalysis, Complete     Status: None   Collection Time: 11/21/22  1:56 PM  Result Value Ref Range   Specific Gravity, UA 1.020 1.005 - 1.030   pH, UA 5.5 5.0 - 7.5   Color, UA Yellow Yellow   Appearance Ur Clear Clear   Leukocytes,UA Negative Negative   Protein,UA Negative Negative/Trace   Glucose, UA Negative Negative   Ketones, UA Negative Negative   RBC, UA  Negative Negative   Bilirubin,  UA Negative Negative   Urobilinogen, Ur 0.2 0.2 - 1.0 mg/dL   Nitrite, UA Negative Negative   Microscopic Examination See below:   Microscopic Examination     Status: None   Collection Time: 11/21/22  1:56 PM   Urine  Result Value Ref Range   WBC, UA 0-5 0 - 5 /hpf   RBC, Urine 0-2 0 - 2 /hpf   Epithelial Cells (non renal) 0-10 0 - 10 /hpf   Bacteria, UA None seen None seen/Few       Assessment & Plan:   Problem List Items Addressed This Visit       Active Problems   HTN (hypertension)    Blood pressure well controlled with current medications.  Continue current therapy.  Will reassess at follow up.       Relevant Medications   amLODipine (NORVASC) 2.5 MG tablet   metoprolol tartrate (LOPRESSOR) 50 MG tablet   losartan (COZAAR) 100 MG tablet   Crohn's disease (HCC)    Patient stable.  Well controlled with current therapy.   Continue current meds.       BPH associated with nocturia   Relevant Medications   finasteride (PROSCAR) 5 MG tablet   oxybutynin (DITROPAN-XL) 10 MG 24 hr tablet   Obesity, Class III, BMI 40-49.9 (morbid obesity) (HCC)    Restart Mounjaro, will get PA taken care of. Will adjust as needed based on results.  The patient is asked to make an attempt to improve diet and exercise patterns to aid in medical management of this problem. Addressed importance of increasing and maintaining water intake.        Neuropathy    Patient stable.  Well controlled with current therapy.   Continue current meds.       Relevant Medications   celecoxib (CELEBREX) 200 MG capsule   gabapentin (NEURONTIN) 300 MG capsule   traZODone (DESYREL) 150 MG tablet   Prediabetes - Primary    A1C Continues to be in prediabetic ranges.  Will reassess at follow up after next lab check.  Patient counseled on dietary choices and verbalized understanding.  Patient educated on foods that contain carbohydrates and the need to decrease intake.   We discussed prediabetes, and what it means and the need for strict dietary control to prevent progression to type 2 diabetes.  Advised to decrease intake of sugary drinks, including sodas, sweet tea, and some juices, and of starch and sugar heavy foods (ie., potatoes, rice, bread, pasta, desserts). He verbalizes understanding and agreement with the changes discussed today.        Other Visit Diagnoses     Essential (primary) hypertension       Relevant Medications   amLODipine (NORVASC) 2.5 MG tablet   metoprolol tartrate (LOPRESSOR) 50 MG tablet   losartan (COZAAR) 100 MG tablet   Idiopathic gout, unspecified chronicity, unspecified site       Relevant Medications   allopurinol (ZYLOPRIM) 300 MG tablet       Return in about 3 months (around 04/14/2023).   Total time spent: 30 minutes  Miki Kins, FNP  01/12/2023   This document may have been prepared by [facility] Voice Recognition software and as such may include unintentional dictation errors.

## 2023-01-13 ENCOUNTER — Other Ambulatory Visit: Payer: Self-pay | Admitting: Family

## 2023-01-13 ENCOUNTER — Telehealth: Payer: Self-pay | Admitting: Internal Medicine

## 2023-01-13 NOTE — Telephone Encounter (Signed)
Stephanie from [facility] called to see why we are starting the patient on Mounjaro 5 mg when the initial starting dose is 2.5 mg. Was this a prescribing error or is there clinical reasoning as to why he should start at 5 mg? If he should start at 5 mg I need clinical reasoning to give to pharmacist, if not, can you resend Rx for the 2.5 mg starter dose?

## 2023-01-19 ENCOUNTER — Ambulatory Visit: Payer: Self-pay

## 2023-01-25 ENCOUNTER — Encounter: Payer: Self-pay | Admitting: Family

## 2023-01-25 NOTE — Assessment & Plan Note (Signed)
Restart Mounjaro, will get PA taken care of. Will adjust as needed based on results.  The patient is asked to make an attempt to improve diet and exercise patterns to aid in medical management of this problem. Addressed importance of increasing and maintaining water intake.

## 2023-01-25 NOTE — Assessment & Plan Note (Signed)
A1C Continues to be in prediabetic ranges.  Will reassess at follow up after next lab check.  Patient counseled on dietary choices and verbalized understanding.  Patient educated on foods that contain carbohydrates and the need to decrease intake.  We discussed prediabetes, and what it means and the need for strict dietary control to prevent progression to type 2 diabetes.  Advised to decrease intake of sugary drinks, including sodas, sweet tea, and some juices, and of starch and sugar heavy foods (ie., potatoes, rice, bread, pasta, desserts). He verbalizes understanding and agreement with the changes discussed today.  

## 2023-01-25 NOTE — Assessment & Plan Note (Signed)
Blood pressure well controlled with current medications.  Continue current therapy.  Will reassess at follow up.  

## 2023-01-25 NOTE — Assessment & Plan Note (Signed)
Patient stable.  Well controlled with current therapy.   Continue current meds.  

## 2023-02-06 ENCOUNTER — Other Ambulatory Visit: Payer: Self-pay | Admitting: Family

## 2023-02-10 DIAGNOSIS — L570 Actinic keratosis: Secondary | ICD-10-CM | POA: Diagnosis not present

## 2023-02-10 DIAGNOSIS — L300 Nummular dermatitis: Secondary | ICD-10-CM | POA: Diagnosis not present

## 2023-02-10 DIAGNOSIS — L298 Other pruritus: Secondary | ICD-10-CM | POA: Diagnosis not present

## 2023-02-10 DIAGNOSIS — L821 Other seborrheic keratosis: Secondary | ICD-10-CM | POA: Diagnosis not present

## 2023-02-10 DIAGNOSIS — L578 Other skin changes due to chronic exposure to nonionizing radiation: Secondary | ICD-10-CM | POA: Diagnosis not present

## 2023-02-10 DIAGNOSIS — Z85828 Personal history of other malignant neoplasm of skin: Secondary | ICD-10-CM | POA: Diagnosis not present

## 2023-02-10 DIAGNOSIS — Z872 Personal history of diseases of the skin and subcutaneous tissue: Secondary | ICD-10-CM | POA: Diagnosis not present

## 2023-02-10 DIAGNOSIS — Z859 Personal history of malignant neoplasm, unspecified: Secondary | ICD-10-CM | POA: Diagnosis not present

## 2023-02-19 ENCOUNTER — Other Ambulatory Visit: Payer: Self-pay

## 2023-02-19 DIAGNOSIS — N401 Enlarged prostate with lower urinary tract symptoms: Secondary | ICD-10-CM

## 2023-02-19 MED ORDER — OXYBUTYNIN CHLORIDE ER 10 MG PO TB24: 10.0000 mg | ORAL_TABLET | Freq: Every day | ORAL | 1 refills | Status: DC

## 2023-02-26 ENCOUNTER — Other Ambulatory Visit: Payer: Self-pay | Admitting: Family

## 2023-02-26 MED ORDER — MOUNJARO 5 MG/0.5ML ~~LOC~~ SOAJ: 5.0000 mg | SUBCUTANEOUS | 3 refills | Status: DC

## 2023-03-17 DIAGNOSIS — M792 Neuralgia and neuritis, unspecified: Secondary | ICD-10-CM | POA: Diagnosis not present

## 2023-04-09 ENCOUNTER — Other Ambulatory Visit: Payer: Self-pay

## 2023-04-09 DIAGNOSIS — E785 Hyperlipidemia, unspecified: Secondary | ICD-10-CM

## 2023-04-09 DIAGNOSIS — I10 Essential (primary) hypertension: Secondary | ICD-10-CM

## 2023-04-09 DIAGNOSIS — R7303 Prediabetes: Secondary | ICD-10-CM | POA: Diagnosis not present

## 2023-04-10 LAB — CMP14+EGFR
ALT: 31 [IU]/L (ref 0–44)
AST: 14 [IU]/L (ref 0–40)
Albumin: 4 g/dL (ref 3.8–4.8)
Alkaline Phosphatase: 33 [IU]/L — ABNORMAL LOW (ref 44–121)
BUN/Creatinine Ratio: 24 (ref 10–24)
BUN: 24 mg/dL (ref 8–27)
Bilirubin Total: 0.4 mg/dL (ref 0.0–1.2)
CO2: 21 mmol/L (ref 20–29)
Calcium: 8.8 mg/dL (ref 8.6–10.2)
Chloride: 104 mmol/L (ref 96–106)
Creatinine, Ser: 1 mg/dL (ref 0.76–1.27)
Globulin, Total: 2.3 g/dL (ref 1.5–4.5)
Glucose: 105 mg/dL — ABNORMAL HIGH (ref 70–99)
Potassium: 4.4 mmol/L (ref 3.5–5.2)
Sodium: 140 mmol/L (ref 134–144)
Total Protein: 6.3 g/dL (ref 6.0–8.5)
eGFR: 78 mL/min/{1.73_m2} (ref >59)

## 2023-04-10 LAB — HEMOGLOBIN A1C
Est. average glucose Bld gHb Est-mCnc: 114 mg/dL
Hgb A1c MFr Bld: 5.6 % (ref 4.8–5.6)

## 2023-04-10 LAB — LIPID PANEL
Chol/HDL Ratio: 4 ratio (ref 0.0–5.0)
Cholesterol, Total: 131 mg/dL (ref 100–199)
HDL: 33 mg/dL — ABNORMAL LOW (ref >39)
LDL Chol Calc (NIH): 83 mg/dL (ref 0–99)
Triglycerides: 77 mg/dL (ref 0–149)
VLDL Cholesterol Cal: 15 mg/dL (ref 5–40)

## 2023-04-10 LAB — TSH: TSH: 2.35 u[IU]/mL (ref 0.450–4.500)

## 2023-04-14 ENCOUNTER — Ambulatory Visit (INDEPENDENT_AMBULATORY_CARE_PROVIDER_SITE_OTHER): Payer: Medicare HMO | Admitting: Family

## 2023-04-14 VITALS — BP 130/80 | HR 70 | Ht 66.0 in | Wt 277.4 lb

## 2023-04-14 DIAGNOSIS — R7303 Prediabetes: Secondary | ICD-10-CM

## 2023-04-14 DIAGNOSIS — I10 Essential (primary) hypertension: Secondary | ICD-10-CM | POA: Diagnosis not present

## 2023-04-14 DIAGNOSIS — K56699 Other intestinal obstruction unspecified as to partial versus complete obstruction: Secondary | ICD-10-CM | POA: Diagnosis not present

## 2023-04-14 DIAGNOSIS — K50919 Crohn's disease, unspecified, with unspecified complications: Secondary | ICD-10-CM | POA: Diagnosis not present

## 2023-04-14 MED ORDER — MOUNJARO 10 MG/0.5ML ~~LOC~~ SOAJ: 10.0000 mg | SUBCUTANEOUS | 3 refills | Status: DC

## 2023-04-14 MED ORDER — TIRZEPATIDE 7.5 MG/0.5ML ~~LOC~~ SOAJ: 7.5000 mg | SUBCUTANEOUS | 0 refills | Status: DC

## 2023-04-15 DIAGNOSIS — R200 Anesthesia of skin: Secondary | ICD-10-CM | POA: Diagnosis not present

## 2023-05-23 ENCOUNTER — Encounter: Payer: Self-pay | Admitting: Family

## 2023-05-23 NOTE — Assessment & Plan Note (Signed)
Patient stable.  Well controlled with current therapy.   Continue current meds.  

## 2023-05-23 NOTE — Assessment & Plan Note (Signed)
Continue current meds.  Will adjust as needed based on results.  The patient is asked to make an attempt to improve diet and exercise patterns to aid in medical management of this problem. Addressed importance of increasing and maintaining water intake.   

## 2023-05-23 NOTE — Assessment & Plan Note (Signed)
Blood pressure well controlled with current medications.  Continue current therapy.  Will reassess at follow up.  

## 2023-05-23 NOTE — Progress Notes (Signed)
Established Patient Office Visit  Subjective:  Patient ID: Isaiah Walker, male    DOB: 1945/10/16  Age: 77 y.o. MRN: [id number]  Chief Complaint  Patient presents with   Follow-up    3 mo    Patient is here today for his 3 months follow up.  He has been feeling well since last appointment.   He does not have additional concerns to discuss today.  Labs were done previously, so we will review in detail. today. Labs look better, A1C is improved.  Patient has lost significant weight on the [facility].   He needs refills.   I have reviewed his active problem list, medication list, allergies, health maintenance, notes from last encounter, lab results for his appointment today.      No other concerns at this time.   Past Medical History:  Diagnosis Date   Arthritis    BPH (benign prostatic hyperplasia)    Crohn's disease (HCC)    Hypertension    Kidney stones    Partial small bowel obstruction (HCC) 09/13/2020    Past Surgical History:  Procedure Laterality Date   CHOLECYSTECTOMY     CHOLECYSTECTOMY, LAPAROSCOPIC N/A    COLON SURGERY     Colectomy 2005   COLONOSCOPY N/A 01/11/2019   Procedure: COLONOSCOPY;  Surgeon: Pasty Spillers, MD;  Location: [facility] ENDOSCOPY;  Service: Endoscopy;  Laterality: N/A;   COLONOSCOPY WITH PROPOFOL N/A 02/11/2019   Procedure: COLONOSCOPY WITH PROPOFOL;  Surgeon: Toney Reil, MD;  Location: [facility] ENDOSCOPY;  Service: Gastroenterology;  Laterality: N/A;  Pediatric endoscope   CYSTOSCOPY W/ RETROGRADES Bilateral 04/01/2017   Procedure: CYSTOSCOPY WITH RETROGRADE PYELOGRAM;  Surgeon: Vanna Scotland, MD;  Location: [facility] ORS;  Service: Urology;  Laterality: Bilateral;   CYSTOSCOPY WITH STENT PLACEMENT Bilateral 04/01/2017   Procedure: CYSTOSCOPY WITH STENT PLACEMENT;  Surgeon: Vanna Scotland, MD;  Location: [facility] ORS;  Service: Urology;  Laterality: Bilateral;   CYSTOSCOPY/URETEROSCOPY/HOLMIUM LASER/STENT PLACEMENT Left 10/16/2014    Procedure: CYSTOSCOPY/URETEROSCOPY/HOLMIUM LASER/STENT PLACEMENT;  Surgeon: Vanna Scotland, MD;  Location: [facility] ORS;  Service: Urology;  Laterality: Left;   CYSTOSCOPY/URETEROSCOPY/HOLMIUM LASER/STENT PLACEMENT Bilateral 04/20/2017   Procedure: CYSTOSCOPY/URETEROSCOPY/HOLMIUM LASER/STENT EXCHANGE;  Surgeon: Vanna Scotland, MD;  Location: [facility] ORS;  Service: Urology;  Laterality: Bilateral;   HERNIA REPAIR     Percutaneous Left    Nephrolithotomy    Social History   Socioeconomic History   Marital status: Married    Spouse name: Not on file   Number of children: Not on file   Years of education: Not on file   Highest education level: Not on file  Occupational History   Not on file  Tobacco Use   Smoking status: Never    Passive exposure: Never   Smokeless tobacco: Never  Vaping Use   Vaping status: Never Used  Substance and Sexual Activity   Alcohol use: No   Drug use: No   Sexual activity: Not Currently  Other Topics Concern   Not on file  Social History Narrative   Not on file   Social Determinants of Health   Financial Resource Strain: Not on file  Food Insecurity: Not on file  Transportation Needs: Not on file  Physical Activity: Not on file  Stress: Not on file  Social Connections: Not on file  Intimate Partner Violence: Not on file    Family History  Problem Relation Age of Onset   Parkinson's disease Mother    Heart disease Father    Lymphoma Brother  Lung cancer Brother    Heart disease Brother     No Known Allergies  Review of Systems  All other systems reviewed and are negative.      Objective:   BP 130/80   Pulse 70   Ht 5\' 6"  (1.676 m)   Wt 277 lb 6.4 oz (125.8 kg)   SpO2 97%   BMI 44.77 kg/m   Vitals:   04/14/23 1319  BP: 130/80  Pulse: 70  Height: 5\' 6"  (1.676 m)  Weight: 277 lb 6.4 oz (125.8 kg)  SpO2: 97%  BMI (Calculated): 44.79    Physical Exam Vitals and nursing note reviewed.  Constitutional:      Appearance:  Normal appearance. He is normal weight.  Eyes:     Conjunctiva/sclera: Conjunctivae normal.     Pupils: Pupils are equal, round, and reactive to light.  Cardiovascular:     Rate and Rhythm: Normal rate and regular rhythm.     Pulses: Normal pulses.     Heart sounds: Normal heart sounds.  Pulmonary:     Effort: Pulmonary effort is normal.     Breath sounds: Normal breath sounds.  Neurological:     General: No focal deficit present.     Mental Status: He is alert.  Psychiatric:        Mood and Affect: Mood normal.        Behavior: Behavior normal.        Thought Content: Thought content normal.        Judgment: Judgment normal.      No results found for any visits on 04/14/23.  Recent Results (from the past 2160 hour(s))  Hemoglobin A1c     Status: None   Collection Time: 04/09/23  9:37 AM  Result Value Ref Range   Hgb A1c MFr Bld 5.6 4.8 - 5.6 %    Comment:          Prediabetes: 5.7 - 6.4          Diabetes: >6.4          Glycemic control for adults with diabetes: <7.0    Est. average glucose Bld gHb Est-mCnc 114 mg/dL  TSH     Status: None   Collection Time: 04/09/23  9:37 AM  Result Value Ref Range   TSH 2.350 0.450 - 4.500 uIU/mL  CMP14+EGFR     Status: Abnormal   Collection Time: 04/09/23  9:37 AM  Result Value Ref Range   Glucose 105 (H) 70 - 99 mg/dL   BUN 24 8 - 27 mg/dL   Creatinine, Ser 6.57 0.76 - 1.27 mg/dL   eGFR 78 >84 ON/GEX/5.28   BUN/Creatinine Ratio 24 10 - 24   Sodium 140 134 - 144 mmol/L   Potassium 4.4 3.5 - 5.2 mmol/L   Chloride 104 96 - 106 mmol/L   CO2 21 20 - 29 mmol/L   Calcium 8.8 8.6 - 10.2 mg/dL   Total Protein 6.3 6.0 - 8.5 g/dL   Albumin 4.0 3.8 - 4.8 g/dL   Globulin, Total 2.3 1.5 - 4.5 g/dL   Bilirubin Total 0.4 0.0 - 1.2 mg/dL   Alkaline Phosphatase 33 (L) 44 - 121 IU/L   AST 14 0 - 40 IU/L   ALT 31 0 - 44 IU/L  Lipid panel     Status: Abnormal   Collection Time: 04/09/23  9:37 AM  Result Value Ref Range   Cholesterol, Total  131 100 - 199 mg/dL   Triglycerides 77  0 - 149 mg/dL   HDL 33 (L) >16 mg/dL   VLDL Cholesterol Cal 15 5 - 40 mg/dL   LDL Chol Calc (NIH) 83 0 - 99 mg/dL   Chol/HDL Ratio 4.0 0.0 - 5.0 ratio    Comment:                                   T. Chol/HDL Ratio                                             Men  Women                               1/2 Avg.Risk  3.4    3.3                                   Avg.Risk  5.0    4.4                                2X Avg.Risk  9.6    7.1                                3X Avg.Risk 23.4   11.0        Assessment & Plan:   Problem List Items Addressed This Visit       Active Problems   Small bowel stricture (HCC)    Patient stable.  Well controlled with current therapy.   Continue current meds.        HTN (hypertension) - Primary    Blood pressure well controlled with current medications.  Continue current therapy.  Will reassess at follow up.        Crohn's disease (HCC)    Patient stable.  Well controlled with current therapy.   Continue current meds.        Obesity, Class III, BMI 40-49.9 (morbid obesity) (HCC)    Continue current meds.  Will adjust as needed based on results.  The patient is asked to make an attempt to improve diet and exercise patterns to aid in medical management of this problem. Addressed importance of increasing and maintaining water intake.        Relevant Medications   tirzepatide (MOUNJARO) 7.5 MG/0.5ML Pen   tirzepatide (MOUNJARO) 10 MG/0.5ML Pen   Prediabetes    Patient educated on foods that contain carbohydrates and the need to decrease intake.  We discussed prediabetes, and what it means and the need for strict dietary control to prevent progression to type 2 diabetes.  Advised to decrease intake of sugary drinks, including sodas, sweet tea, and some juices, and of starch and sugar heavy foods (ie., potatoes, rice, bread, pasta, desserts). He verbalizes understanding and agreement with the changes  discussed today.  A1C Continues to be in prediabetic ranges.  Will reassess at follow up after next lab check.  Patient counseled on dietary choices and verbalized understanding.         Return in about 4 months (around 08/14/2023).   Total time spent: 20  minutes  Miki Kins, FNP  04/14/2023   This document may have been prepared by [facility] Voice Recognition software and as such may include unintentional dictation errors.

## 2023-05-23 NOTE — Assessment & Plan Note (Signed)
Patient educated on foods that contain carbohydrates and the need to decrease intake.  We discussed prediabetes, and what it means and the need for strict dietary control to prevent progression to type 2 diabetes.  Advised to decrease intake of sugary drinks, including sodas, sweet tea, and some juices, and of starch and sugar heavy foods (ie., potatoes, rice, bread, pasta, desserts). He verbalizes understanding and agreement with the changes discussed today.   A1C Continues to be in prediabetic ranges.  Will reassess at follow up after next lab check.  Patient counseled on dietary choices and verbalized understanding.

## 2023-06-22 ENCOUNTER — Other Ambulatory Visit: Payer: Self-pay

## 2023-06-22 MED ORDER — MOUNJARO 10 MG/0.5ML ~~LOC~~ SOAJ: 10.0000 mg | SUBCUTANEOUS | 3 refills | Status: DC

## 2023-08-10 ENCOUNTER — Other Ambulatory Visit: Payer: Medicare Other

## 2023-08-10 DIAGNOSIS — R7303 Prediabetes: Secondary | ICD-10-CM

## 2023-08-10 DIAGNOSIS — I10 Essential (primary) hypertension: Secondary | ICD-10-CM

## 2023-08-10 DIAGNOSIS — E785 Hyperlipidemia, unspecified: Secondary | ICD-10-CM

## 2023-08-11 LAB — CMP14+EGFR
ALT: 18 [IU]/L (ref 0–44)
AST: 10 [IU]/L (ref 0–40)
Albumin: 4.1 g/dL (ref 3.8–4.8)
Alkaline Phosphatase: 31 [IU]/L — ABNORMAL LOW (ref 44–121)
BUN/Creatinine Ratio: 19 (ref 10–24)
BUN: 23 mg/dL (ref 8–27)
Bilirubin Total: 0.4 mg/dL (ref 0.0–1.2)
CO2: 24 mmol/L (ref 20–29)
Calcium: 9.5 mg/dL (ref 8.6–10.2)
Chloride: 105 mmol/L (ref 96–106)
Creatinine, Ser: 1.2 mg/dL (ref 0.76–1.27)
Globulin, Total: 2.2 g/dL (ref 1.5–4.5)
Glucose: 93 mg/dL (ref 70–99)
Potassium: 5.1 mmol/L (ref 3.5–5.2)
Sodium: 141 mmol/L (ref 134–144)
Total Protein: 6.3 g/dL (ref 6.0–8.5)
eGFR: 62 mL/min/{1.73_m2} (ref >59)

## 2023-08-11 LAB — LIPID PANEL
Chol/HDL Ratio: 3.6 {ratio} (ref 0.0–5.0)
Cholesterol, Total: 135 mg/dL (ref 100–199)
HDL: 38 mg/dL — ABNORMAL LOW (ref >39)
LDL Chol Calc (NIH): 85 mg/dL (ref 0–99)
Triglycerides: 56 mg/dL (ref 0–149)
VLDL Cholesterol Cal: 12 mg/dL (ref 5–40)

## 2023-08-11 LAB — HEMOGLOBIN A1C
Est. average glucose Bld gHb Est-mCnc: 105 mg/dL
Hgb A1c MFr Bld: 5.3 % (ref 4.8–5.6)

## 2023-08-11 LAB — TSH: TSH: 3.17 u[IU]/mL (ref 0.450–4.500)

## 2023-08-14 ENCOUNTER — Ambulatory Visit (INDEPENDENT_AMBULATORY_CARE_PROVIDER_SITE_OTHER): Payer: Medicare Other | Admitting: Family

## 2023-08-14 ENCOUNTER — Encounter: Payer: Self-pay | Admitting: Family

## 2023-08-14 VITALS — BP 128/80 | HR 75 | Ht 66.0 in | Wt 269.4 lb

## 2023-08-14 DIAGNOSIS — E782 Mixed hyperlipidemia: Secondary | ICD-10-CM

## 2023-08-14 DIAGNOSIS — I10 Essential (primary) hypertension: Secondary | ICD-10-CM

## 2023-08-14 DIAGNOSIS — M1A09X0 Idiopathic chronic gout, multiple sites, without tophus (tophi): Secondary | ICD-10-CM

## 2023-08-14 DIAGNOSIS — E1165 Type 2 diabetes mellitus with hyperglycemia: Secondary | ICD-10-CM

## 2023-08-14 DIAGNOSIS — K50919 Crohn's disease, unspecified, with unspecified complications: Secondary | ICD-10-CM

## 2023-08-14 DIAGNOSIS — E66813 Obesity, class 3: Secondary | ICD-10-CM

## 2023-09-16 ENCOUNTER — Other Ambulatory Visit: Payer: Self-pay

## 2023-09-16 DIAGNOSIS — N200 Calculus of kidney: Secondary | ICD-10-CM

## 2023-09-17 ENCOUNTER — Ambulatory Visit: Payer: Self-pay | Admitting: Urology

## 2023-09-17 ENCOUNTER — Ambulatory Visit
Admission: RE | Admit: 2023-09-17 | Discharge: 2023-09-17 | Disposition: A | Discharge status: Home / Self Care | Source: Ambulatory Visit | Attending: Urology | Admitting: Urology

## 2023-09-17 VITALS — BP 122/70 | HR 76 | Ht 66.0 in | Wt 261.0 lb

## 2023-09-17 DIAGNOSIS — N401 Enlarged prostate with lower urinary tract symptoms: Secondary | ICD-10-CM | POA: Diagnosis not present

## 2023-09-17 DIAGNOSIS — R102 Pelvic and perineal pain: Secondary | ICD-10-CM

## 2023-09-17 DIAGNOSIS — N200 Calculus of kidney: Secondary | ICD-10-CM | POA: Diagnosis present

## 2023-09-17 DIAGNOSIS — R351 Nocturia: Secondary | ICD-10-CM

## 2023-09-17 DIAGNOSIS — N3281 Overactive bladder: Secondary | ICD-10-CM

## 2023-09-17 LAB — URINALYSIS, COMPLETE
Bilirubin, UA: NEGATIVE
Glucose, UA: NEGATIVE
Ketones, UA: NEGATIVE
Leukocytes,UA: NEGATIVE
Nitrite, UA: NEGATIVE
Protein,UA: NEGATIVE
Specific Gravity, UA: 1.025 (ref 1.005–1.030)
Urobilinogen, Ur: 0.2 mg/dL (ref 0.2–1.0)
pH, UA: 5.5 (ref 5.0–7.5)

## 2023-09-17 LAB — MICROSCOPIC EXAMINATION

## 2023-09-17 MED ORDER — OXYBUTYNIN CHLORIDE ER 15 MG PO TB24: 15.0000 mg | ORAL_TABLET | Freq: Every day | ORAL | 3 refills | Status: AC

## 2023-09-17 MED ORDER — FINASTERIDE 5 MG PO TABS: 5.0000 mg | ORAL_TABLET | Freq: Every day | ORAL | 3 refills | Status: DC

## 2023-09-17 NOTE — Progress Notes (Signed)
   09/17/2023 2:34 PM   Isaiah Walker 28-Mar-1946 [id number]  Reason for visit: Follow up right scrotal swelling/hydrocele, overactive bladder, nephrolithiasis  HPI: 78 year old male who previously was followed by Dr. Apolinar Junes and Dr. Sherryl Barters. He has had long-term intermittent problems with the right testicle since his vasectomy with intermittent discomfort and groin pain.  Scrotal ultrasound in June 2024 showed an enlarged right testicle with a small right hydrocele.  He has long-term urinary symptoms of urgency and frequency during the day and has been on finasteride and oxybutynin.  He does well overnight.  He continues to be bothered by urinary urgency and frequency during the day.  He drinks primarily water.  I recommended increasing oxybutynin dose to 15 mg daily, could consider a beta 3 agonist if no improvement.  In terms of his right scrotal swelling, this is stable.  On exam, small right hydrocele, right testicle tender.  He would like to continue with observation.  Denies any stone events over the last year, I personally reviewed and interpreted the KUB today that shows a stable left lower pole stone  Oxybutynin increased to 15 mg daily, continue finasteride, consider beta 3 agonist in the future if worsening OAB symptoms Continue surveillance for left lower pole stone Continue behavioral strategies for chronic right scrotal pain RTC 1 year KUB prior  Sondra Come, MD  [facility] [address] [place], [place] [address] [phone]

## 2023-09-30 ENCOUNTER — Other Ambulatory Visit: Payer: Self-pay | Admitting: Family

## 2023-09-30 DIAGNOSIS — G629 Polyneuropathy, unspecified: Secondary | ICD-10-CM

## 2023-09-30 MED ORDER — CELECOXIB 200 MG PO CAPS: 200.0000 mg | ORAL_CAPSULE | Freq: Two times a day (BID) | ORAL | 2 refills | Status: DC

## 2023-10-13 ENCOUNTER — Other Ambulatory Visit: Payer: Self-pay

## 2023-10-13 MED ORDER — MOUNJARO 12.5 MG/0.5ML ~~LOC~~ SOAJ: 12.5000 mg | SUBCUTANEOUS | 1 refills | Status: DC

## 2023-11-01 ENCOUNTER — Encounter: Payer: Self-pay | Admitting: Family

## 2023-11-01 DIAGNOSIS — E1165 Type 2 diabetes mellitus with hyperglycemia: Secondary | ICD-10-CM | POA: Insufficient documentation

## 2023-11-01 DIAGNOSIS — M1A09X0 Idiopathic chronic gout, multiple sites, without tophus (tophi): Secondary | ICD-10-CM | POA: Insufficient documentation

## 2023-11-01 NOTE — Assessment & Plan Note (Signed)
 Patient stable.  Well controlled with current therapy.   Continue current meds.

## 2023-11-01 NOTE — Assessment & Plan Note (Signed)
 Continue current meds.  Will adjust as needed based on results.  The patient is asked to make an attempt to improve diet and exercise patterns to aid in medical management of this problem. Addressed importance of increasing and maintaining water intake.

## 2023-11-01 NOTE — Assessment & Plan Note (Signed)
 Continue current diabetes POC, as patient has been well controlled on current regimen.  Will adjust meds if needed based on labs.

## 2023-11-01 NOTE — Progress Notes (Signed)
 Established Patient Office Visit  Subjective:  Patient ID: Isaiah Walker, male    DOB: 1946-01-31  Age: 78 y.o. MRN: [id number]  Chief Complaint  Patient presents with   Follow-up    4 month follow up    Patient is here today for his 4 months follow up.  He has been feeling well since last appointment.   He does not have additional concerns to discuss today.  Labs were done previously, so we will review these in detail today. He needs refills.   I have reviewed his active problem list, medication list, allergies, health maintenance, notes from last encounter, lab results for his appointment today.      No other concerns at this time.   Past Medical History:  Diagnosis Date   Arthritis    BPH (benign prostatic hyperplasia)    Crohn's disease (HCC)    Hypertension    Kidney stones    Partial small bowel obstruction (HCC) 09/13/2020    Past Surgical History:  Procedure Laterality Date   CHOLECYSTECTOMY     CHOLECYSTECTOMY, LAPAROSCOPIC N/A    COLON SURGERY     Colectomy 2005   COLONOSCOPY N/A 01/11/2019   Procedure: COLONOSCOPY;  Surgeon: Irby Mannan, MD;  Location: [facility] ENDOSCOPY;  Service: Endoscopy;  Laterality: N/A;   COLONOSCOPY WITH PROPOFOL  N/A 02/11/2019   Procedure: COLONOSCOPY WITH PROPOFOL ;  Surgeon: Selena Daily, MD;  Location: [facility] ENDOSCOPY;  Service: Gastroenterology;  Laterality: N/A;  Pediatric endoscope   CYSTOSCOPY W/ RETROGRADES Bilateral 04/01/2017   Procedure: CYSTOSCOPY WITH RETROGRADE PYELOGRAM;  Surgeon: Dustin Gimenez, MD;  Location: [facility] ORS;  Service: Urology;  Laterality: Bilateral;   CYSTOSCOPY WITH STENT PLACEMENT Bilateral 04/01/2017   Procedure: CYSTOSCOPY WITH STENT PLACEMENT;  Surgeon: Dustin Gimenez, MD;  Location: [facility] ORS;  Service: Urology;  Laterality: Bilateral;   CYSTOSCOPY/URETEROSCOPY/HOLMIUM LASER/STENT PLACEMENT Left 10/16/2014   Procedure: CYSTOSCOPY/URETEROSCOPY/HOLMIUM LASER/STENT PLACEMENT;  Surgeon: Dustin Gimenez, MD;  Location: [facility] ORS;  Service: Urology;  Laterality: Left;   CYSTOSCOPY/URETEROSCOPY/HOLMIUM LASER/STENT PLACEMENT Bilateral 04/20/2017   Procedure: CYSTOSCOPY/URETEROSCOPY/HOLMIUM LASER/STENT EXCHANGE;  Surgeon: Dustin Gimenez, MD;  Location: [facility] ORS;  Service: Urology;  Laterality: Bilateral;   HERNIA REPAIR     Percutaneous Left    Nephrolithotomy    Social History   Socioeconomic History   Marital status: Married    Spouse name: Not on file   Number of children: Not on file   Years of education: Not on file   Highest education level: Not on file  Occupational History   Not on file  Tobacco Use   Smoking status: Never    Passive exposure: Never   Smokeless tobacco: Never  Vaping Use   Vaping status: Never Used  Substance and Sexual Activity   Alcohol use: No   Drug use: No   Sexual activity: Not Currently  Other Topics Concern   Not on file  Social History Narrative   Not on file   Social Drivers of Health   Financial Resource Strain: Not on file  Food Insecurity: Not on file  Transportation Needs: Not on file  Physical Activity: Not on file  Stress: Not on file  Social Connections: Not on file  Intimate Partner Violence: Not on file    Family History  Problem Relation Age of Onset   Parkinson's disease Mother    Heart disease Father    Lymphoma Brother    Lung cancer Brother    Heart disease Brother  No Known Allergies  Review of Systems  All other systems reviewed and are negative.      Objective:   BP 128/80   Pulse 75   Ht 5\' 6"  (1.676 m)   Wt 269 lb 6.4 oz (122.2 kg)   SpO2 96%   BMI 43.48 kg/m   Vitals:   08/14/23 1305  BP: 128/80  Pulse: 75  Height: 5\' 6"  (1.676 m)  Weight: 269 lb 6.4 oz (122.2 kg)  SpO2: 96%  BMI (Calculated): 43.5    Physical Exam Vitals and nursing note reviewed.  Constitutional:      Appearance: Normal appearance. He is normal weight.  HENT:     Head: Normocephalic and atraumatic.   Eyes:     Pupils: Pupils are equal, round, and reactive to light.  Cardiovascular:     Rate and Rhythm: Normal rate and regular rhythm.     Pulses: Normal pulses.     Heart sounds: Normal heart sounds.  Pulmonary:     Effort: Pulmonary effort is normal.     Breath sounds: Normal breath sounds.  Musculoskeletal:     Cervical back: Normal range of motion.  Neurological:     General: No focal deficit present.     Mental Status: He is alert.  Psychiatric:        Mood and Affect: Mood normal.        Behavior: Behavior normal.        Thought Content: Thought content normal.        Judgment: Judgment normal.      No results found for any visits on 08/14/23.  Recent Results (from the past 2160 hours)  Lipid panel     Status: Abnormal   Collection Time: 08/10/23  9:23 AM  Result Value Ref Range   Cholesterol, Total 135 100 - 199 mg/dL   Triglycerides 56 0 - 149 mg/dL   HDL 38 (L) >16 mg/dL   VLDL Cholesterol Cal 12 5 - 40 mg/dL   LDL Chol Calc (NIH) 85 0 - 99 mg/dL   Chol/HDL Ratio 3.6 0.0 - 5.0 ratio    Comment:                                   T. Chol/HDL Ratio                                             Men  Women                               1/2 Avg.Risk  3.4    3.3                                   Avg.Risk  5.0    4.4                                2X Avg.Risk  9.6    7.1  3X Avg.Risk 23.4   11.0   CMP14+EGFR     Status: Abnormal   Collection Time: 08/10/23  9:23 AM  Result Value Ref Range   Glucose 93 70 - 99 mg/dL   BUN 23 8 - 27 mg/dL   Creatinine, Ser 1.61 0.76 - 1.27 mg/dL   eGFR 62 >09 UE/AVW/0.98   BUN/Creatinine Ratio 19 10 - 24   Sodium 141 134 - 144 mmol/L   Potassium 5.1 3.5 - 5.2 mmol/L   Chloride 105 96 - 106 mmol/L   CO2 24 20 - 29 mmol/L   Calcium 9.5 8.6 - 10.2 mg/dL   Total Protein 6.3 6.0 - 8.5 g/dL   Albumin 4.1 3.8 - 4.8 g/dL   Globulin, Total 2.2 1.5 - 4.5 g/dL   Bilirubin Total 0.4 0.0 - 1.2 mg/dL    Alkaline Phosphatase 31 (L) 44 - 121 IU/L   AST 10 0 - 40 IU/L   ALT 18 0 - 44 IU/L  TSH     Status: None   Collection Time: 08/10/23  9:23 AM  Result Value Ref Range   TSH 3.170 0.450 - 4.500 uIU/mL  Hemoglobin A1c     Status: None   Collection Time: 08/10/23  9:23 AM  Result Value Ref Range   Hgb A1c MFr Bld 5.3 4.8 - 5.6 %    Comment:          Prediabetes: 5.7 - 6.4          Diabetes: >6.4          Glycemic control for adults with diabetes: <7.0    Est. average glucose Bld gHb Est-mCnc 105 mg/dL  Urinalysis, Complete     Status: Abnormal   Collection Time: 09/17/23 12:58 PM  Result Value Ref Range   Specific Gravity, UA 1.025 1.005 - 1.030   pH, UA 5.5 5.0 - 7.5   Color, UA Yellow Yellow   Appearance Ur Clear Clear   Leukocytes,UA Negative Negative   Protein,UA Negative Negative/Trace   Glucose, UA Negative Negative   Ketones, UA Negative Negative   RBC, UA 1+ (A) Negative   Bilirubin, UA Negative Negative   Urobilinogen, Ur 0.2 0.2 - 1.0 mg/dL   Nitrite, UA Negative Negative   Microscopic Examination See below:   Microscopic Examination     Status: Abnormal   Collection Time: 09/17/23 12:58 PM   Urine  Result Value Ref Range   WBC, UA 0-5 0 - 5 /hpf   RBC, Urine 3-10 (A) 0 - 2 /hpf   Epithelial Cells (non renal) 0-10 0 - 10 /hpf   Bacteria, UA Few None seen/Few       Assessment & Plan:   Problem List Items Addressed This Visit       Cardiovascular and Mediastinum   HTN (hypertension)   Blood pressure well controlled with current medications.  Continue current therapy.  Will reassess at follow up.          Endocrine   Type 2 diabetes mellitus with hyperglycemia, without long-term current use of insulin (HCC) - Primary   Continue current diabetes POC, as patient has been well controlled on current regimen.  Will adjust meds if needed based on labs.          Other   Crohn's disease (HCC)   Patient stable.  Well controlled with current therapy.    Continue current meds.        Obesity, Class III, BMI 40-49.9 (morbid obesity)  Continue current meds.  Will adjust as needed based on results.  The patient is asked to make an attempt to improve diet and exercise patterns to aid in medical management of this problem. Addressed importance of increasing and maintaining water intake.        Morbid (severe) obesity due to excess calories (HCC)   Idiopathic chronic gout of multiple sites without tophus   Patient stable.  Well controlled with current therapy.   Continue current meds.        Other Visit Diagnoses       Mixed hyperlipidemia       Patient stable.  Well controlled with current therapy.  Continue current treatment       Return in about 4 months (around 12/12/2023).   Total time spent: 20 minutes  Trenda Frisk, FNP  08/14/2023   This document may have been prepared by [facility] Voice Recognition software and as such may include unintentional dictation errors.

## 2023-11-01 NOTE — Assessment & Plan Note (Signed)
 Blood pressure well controlled with current medications.  Continue current therapy.  Will reassess at follow up.

## 2023-12-08 ENCOUNTER — Other Ambulatory Visit: Payer: Self-pay | Admitting: Family

## 2023-12-08 DIAGNOSIS — I10 Essential (primary) hypertension: Secondary | ICD-10-CM

## 2023-12-10 ENCOUNTER — Other Ambulatory Visit: Payer: Self-pay

## 2023-12-10 MED ORDER — TIRZEPATIDE 15 MG/0.5ML ~~LOC~~ SOAJ
15.0000 mg | SUBCUTANEOUS | 3 refills | Status: DC
Start: 2023-12-10 — End: 2024-02-19

## 2023-12-21 ENCOUNTER — Other Ambulatory Visit: Payer: Self-pay

## 2023-12-21 DIAGNOSIS — G629 Polyneuropathy, unspecified: Secondary | ICD-10-CM

## 2023-12-21 MED ORDER — GABAPENTIN 300 MG PO CAPS
300.0000 mg | ORAL_CAPSULE | Freq: Two times a day (BID) | ORAL | 1 refills | Status: DC
Start: 2023-12-21 — End: 2024-02-19

## 2024-01-12 ENCOUNTER — Other Ambulatory Visit

## 2024-01-12 ENCOUNTER — Other Ambulatory Visit: Payer: Self-pay

## 2024-01-12 DIAGNOSIS — D519 Vitamin B12 deficiency anemia, unspecified: Secondary | ICD-10-CM

## 2024-01-12 DIAGNOSIS — N401 Enlarged prostate with lower urinary tract symptoms: Secondary | ICD-10-CM

## 2024-01-12 DIAGNOSIS — E66813 Obesity, class 3: Secondary | ICD-10-CM

## 2024-01-12 DIAGNOSIS — E559 Vitamin D deficiency, unspecified: Secondary | ICD-10-CM

## 2024-01-12 DIAGNOSIS — I10 Essential (primary) hypertension: Secondary | ICD-10-CM

## 2024-01-12 DIAGNOSIS — E782 Mixed hyperlipidemia: Secondary | ICD-10-CM

## 2024-01-12 DIAGNOSIS — E1165 Type 2 diabetes mellitus with hyperglycemia: Secondary | ICD-10-CM

## 2024-01-13 LAB — CMP14+EGFR
ALT: 17 IU/L (ref 0–44)
AST: 10 IU/L (ref 0–40)
Albumin: 4.3 g/dL (ref 3.8–4.8)
Alkaline Phosphatase: 34 IU/L — ABNORMAL LOW (ref 44–121)
BUN/Creatinine Ratio: 18 (ref 10–24)
BUN: 21 mg/dL (ref 8–27)
Bilirubin Total: 0.7 mg/dL (ref 0.0–1.2)
CO2: 20 mmol/L (ref 20–29)
Calcium: 9 mg/dL (ref 8.6–10.2)
Chloride: 102 mmol/L (ref 96–106)
Creatinine, Ser: 1.15 mg/dL (ref 0.76–1.27)
Globulin, Total: 2.1 g/dL (ref 1.5–4.5)
Glucose: 102 mg/dL — ABNORMAL HIGH (ref 70–99)
Potassium: 4.5 mmol/L (ref 3.5–5.2)
Sodium: 137 mmol/L (ref 134–144)
Total Protein: 6.4 g/dL (ref 6.0–8.5)
eGFR: 66 mL/min/1.73 (ref >59)

## 2024-01-13 LAB — LIPID PANEL
Chol/HDL Ratio: 4.1 ratio (ref 0.0–5.0)
Cholesterol, Total: 131 mg/dL (ref 100–199)
HDL: 32 mg/dL — ABNORMAL LOW (ref >39)
LDL Chol Calc (NIH): 86 mg/dL (ref 0–99)
Triglycerides: 63 mg/dL (ref 0–149)
VLDL Cholesterol Cal: 13 mg/dL (ref 5–40)

## 2024-01-13 LAB — TSH: TSH: 2.21 u[IU]/mL (ref 0.450–4.500)

## 2024-01-13 LAB — VITAMIN B12: Vitamin B-12: 639 pg/mL (ref 232–1245)

## 2024-01-13 LAB — HEMOGLOBIN A1C
Est. average glucose Bld gHb Est-mCnc: 100 mg/dL
Hgb A1c MFr Bld: 5.1 % (ref 4.8–5.6)

## 2024-01-13 LAB — VITAMIN D 25 HYDROXY (VIT D DEFICIENCY, FRACTURES): Vit D, 25-Hydroxy: 42.2 ng/mL (ref 30.0–100.0)

## 2024-01-13 LAB — PSA: Prostate Specific Ag, Serum: 1.1 ng/mL (ref 0.0–4.0)

## 2024-02-01 ENCOUNTER — Other Ambulatory Visit: Payer: Self-pay | Admitting: Family

## 2024-02-01 DIAGNOSIS — M1000 Idiopathic gout, unspecified site: Secondary | ICD-10-CM

## 2024-02-10 ENCOUNTER — Other Ambulatory Visit: Payer: Self-pay | Admitting: Family

## 2024-02-10 DIAGNOSIS — G629 Polyneuropathy, unspecified: Secondary | ICD-10-CM

## 2024-02-11 ENCOUNTER — Encounter: Payer: Self-pay | Admitting: Urology

## 2024-02-12 ENCOUNTER — Ambulatory Visit: Payer: Medicare Other | Admitting: Family

## 2024-02-16 ENCOUNTER — Ambulatory Visit: Payer: Self-pay

## 2024-02-19 ENCOUNTER — Ambulatory Visit: Admitting: Family

## 2024-02-19 VITALS — BP 126/68 | HR 77 | Ht 66.0 in | Wt 242.0 lb

## 2024-02-19 DIAGNOSIS — K429 Umbilical hernia without obstruction or gangrene: Secondary | ICD-10-CM

## 2024-02-19 DIAGNOSIS — G629 Polyneuropathy, unspecified: Secondary | ICD-10-CM | POA: Diagnosis not present

## 2024-02-19 DIAGNOSIS — M1000 Idiopathic gout, unspecified site: Secondary | ICD-10-CM | POA: Diagnosis not present

## 2024-02-19 DIAGNOSIS — E66813 Obesity, class 3: Secondary | ICD-10-CM

## 2024-02-19 DIAGNOSIS — E1165 Type 2 diabetes mellitus with hyperglycemia: Secondary | ICD-10-CM | POA: Diagnosis not present

## 2024-02-19 DIAGNOSIS — I10 Essential (primary) hypertension: Secondary | ICD-10-CM

## 2024-02-19 DIAGNOSIS — R351 Nocturia: Secondary | ICD-10-CM

## 2024-02-19 DIAGNOSIS — K5090 Crohn's disease, unspecified, without complications: Secondary | ICD-10-CM

## 2024-02-19 DIAGNOSIS — N401 Enlarged prostate with lower urinary tract symptoms: Secondary | ICD-10-CM

## 2024-02-19 LAB — POCT CBG (FASTING - GLUCOSE)-MANUAL ENTRY: Glucose Fasting, POC: 96 mg/dL (ref 70–99)

## 2024-02-19 MED ORDER — ALLOPURINOL 300 MG PO TABS
300.0000 mg | ORAL_TABLET | Freq: Every day | ORAL | 0 refills | Status: AC
Start: 2024-02-19 — End: ?

## 2024-02-19 MED ORDER — LOSARTAN POTASSIUM 100 MG PO TABS
100.0000 mg | ORAL_TABLET | Freq: Every day | ORAL | 3 refills | Status: AC
Start: 2024-02-19 — End: ?

## 2024-02-19 MED ORDER — FINASTERIDE 5 MG PO TABS
5.0000 mg | ORAL_TABLET | Freq: Every day | ORAL | 3 refills | Status: AC
Start: 2024-02-19 — End: ?

## 2024-02-19 MED ORDER — GABAPENTIN 300 MG PO CAPS: 300.0000 mg | ORAL_CAPSULE | Freq: Two times a day (BID) | ORAL | 1 refills | Status: AC

## 2024-02-19 MED ORDER — METOPROLOL TARTRATE 50 MG PO TABS
50.0000 mg | ORAL_TABLET | Freq: Two times a day (BID) | ORAL | 3 refills | Status: DC
Start: 2024-02-19 — End: 2024-05-02

## 2024-02-19 MED ORDER — TRAZODONE HCL 150 MG PO TABS: 150.0000 mg | ORAL_TABLET | Freq: Every evening | ORAL | 0 refills | Status: AC | PRN

## 2024-02-19 MED ORDER — AMLODIPINE BESYLATE 2.5 MG PO TABS: 2.5000 mg | ORAL_TABLET | Freq: Every day | ORAL | 3 refills | Status: AC

## 2024-02-19 MED ORDER — TIRZEPATIDE 15 MG/0.5ML ~~LOC~~ SOAJ: 15.0000 mg | SUBCUTANEOUS | 3 refills | Status: DC

## 2024-02-19 MED ORDER — CELECOXIB 200 MG PO CAPS
200.0000 mg | ORAL_CAPSULE | Freq: Two times a day (BID) | ORAL | 2 refills | Status: AC
Start: 2024-02-19 — End: 2025-02-18

## 2024-02-19 NOTE — Progress Notes (Signed)
 Established Patient Office Visit  Subjective:  Patient ID: Isaiah Walker, male    DOB: 1945/10/07  Age: 78 y.o. MRN: [id number]  Chief Complaint  Patient presents with   Follow-up    6 month follow    Patient is here today for his 6 months follow up.  He has been feeling fairly well since last appointment.   He does not have additional concerns to discuss today.  Labs were done prior to appointment, will discuss in detail today.  He needs refills.   I have reviewed his active problem list, medication list, allergies, notes from last encounter, lab results for his appointment today.      No other concerns at this time.   Past Medical History:  Diagnosis Date   Arthritis    BPH (benign prostatic hyperplasia)    Crohn's disease (HCC)    Hypertension    Kidney stones    Partial small bowel obstruction (HCC) 09/13/2020    Past Surgical History:  Procedure Laterality Date   CHOLECYSTECTOMY     CHOLECYSTECTOMY, LAPAROSCOPIC N/A    COLON SURGERY     Colectomy 2005   COLONOSCOPY N/A 01/11/2019   Procedure: COLONOSCOPY;  Surgeon: Janalyn Keene NOVAK, MD;  Location: [facility] ENDOSCOPY;  Service: Endoscopy;  Laterality: N/A;   COLONOSCOPY WITH PROPOFOL  N/A 02/11/2019   Procedure: COLONOSCOPY WITH PROPOFOL ;  Surgeon: Unk Corinn Skiff, MD;  Location: [facility] ENDOSCOPY;  Service: Gastroenterology;  Laterality: N/A;  Pediatric endoscope   CYSTOSCOPY W/ RETROGRADES Bilateral 04/01/2017   Procedure: CYSTOSCOPY WITH RETROGRADE PYELOGRAM;  Surgeon: Penne Knee, MD;  Location: [facility] ORS;  Service: Urology;  Laterality: Bilateral;   CYSTOSCOPY WITH STENT PLACEMENT Bilateral 04/01/2017   Procedure: CYSTOSCOPY WITH STENT PLACEMENT;  Surgeon: Penne Knee, MD;  Location: [facility] ORS;  Service: Urology;  Laterality: Bilateral;   CYSTOSCOPY/URETEROSCOPY/HOLMIUM LASER/STENT PLACEMENT Left 10/16/2014   Procedure: CYSTOSCOPY/URETEROSCOPY/HOLMIUM LASER/STENT PLACEMENT;  Surgeon: Knee Penne, MD;   Location: [facility] ORS;  Service: Urology;  Laterality: Left;   CYSTOSCOPY/URETEROSCOPY/HOLMIUM LASER/STENT PLACEMENT Bilateral 04/20/2017   Procedure: CYSTOSCOPY/URETEROSCOPY/HOLMIUM LASER/STENT EXCHANGE;  Surgeon: Penne Knee, MD;  Location: [facility] ORS;  Service: Urology;  Laterality: Bilateral;   HERNIA REPAIR     Percutaneous Left    Nephrolithotomy    Social History   Socioeconomic History   Marital status: Married    Spouse name: Not on file   Number of children: Not on file   Years of education: Not on file   Highest education level: Not on file  Occupational History   Not on file  Tobacco Use   Smoking status: Never    Passive exposure: Never   Smokeless tobacco: Never  Vaping Use   Vaping status: Never Used  Substance and Sexual Activity   Alcohol use: No   Drug use: No   Sexual activity: Not Currently  Other Topics Concern   Not on file  Social History Narrative   Not on file   Social Drivers of Health   Financial Resource Strain: Not on file  Food Insecurity: Not on file  Transportation Needs: Not on file  Physical Activity: Not on file  Stress: Not on file  Social Connections: Not on file  Intimate Partner Violence: Not on file    Family History  Problem Relation Age of Onset   Parkinson's disease Mother    Heart disease Father    Lymphoma Brother    Lung cancer Brother    Heart disease Brother     No Known Allergies  Review of Systems  All other systems reviewed and are negative.      Objective:   BP 126/68   Pulse 77   Ht 5' 6 (1.676 m)   Wt 242 lb (109.8 kg)   SpO2 96%   BMI 39.06 kg/m   Vitals:   02/19/24 1337  BP: 126/68  Pulse: 77  Height: 5' 6 (1.676 m)  Weight: 242 lb (109.8 kg)  SpO2: 96%  BMI (Calculated): 39.08    Physical Exam Vitals and nursing note reviewed.  Constitutional:      Appearance: Normal appearance. He is normal weight.  Eyes:     Pupils: Pupils are equal, round, and reactive to light.   Cardiovascular:     Rate and Rhythm: Normal rate and regular rhythm.     Pulses: Normal pulses.     Heart sounds: Normal heart sounds.  Pulmonary:     Effort: Pulmonary effort is normal.     Breath sounds: Normal breath sounds.  Neurological:     Mental Status: He is alert.  Psychiatric:        Mood and Affect: Mood normal.        Behavior: Behavior normal.      Results for orders placed or performed in visit on 02/19/24  POCT CBG (Fasting - Glucose)  Result Value Ref Range   Glucose Fasting, POC 96 70 - 99 mg/dL    Recent Results (from the past 2160 hours)  Hemoglobin A1c     Status: None   Collection Time: 01/12/24 10:38 AM  Result Value Ref Range   Hgb A1c MFr Bld 5.1 4.8 - 5.6 %    Comment:          Prediabetes: 5.7 - 6.4          Diabetes: >6.4          Glycemic control for adults with diabetes: <7.0    Est. average glucose Bld gHb Est-mCnc 100 mg/dL  TSH     Status: None   Collection Time: 01/12/24 10:38 AM  Result Value Ref Range   TSH 2.210 0.450 - 4.500 uIU/mL  CMP14+EGFR     Status: Abnormal   Collection Time: 01/12/24 10:38 AM  Result Value Ref Range   Glucose 102 (H) 70 - 99 mg/dL   BUN 21 8 - 27 mg/dL   Creatinine, Ser 8.84 0.76 - 1.27 mg/dL   eGFR 66 >40 fO/fpw/8.26   BUN/Creatinine Ratio 18 10 - 24   Sodium 137 134 - 144 mmol/L   Potassium 4.5 3.5 - 5.2 mmol/L   Chloride 102 96 - 106 mmol/L   CO2 20 20 - 29 mmol/L   Calcium 9.0 8.6 - 10.2 mg/dL   Total Protein 6.4 6.0 - 8.5 g/dL   Albumin 4.3 3.8 - 4.8 g/dL   Globulin, Total 2.1 1.5 - 4.5 g/dL   Bilirubin Total 0.7 0.0 - 1.2 mg/dL   Alkaline Phosphatase 34 (L) 44 - 121 IU/L   AST 10 0 - 40 IU/L   ALT 17 0 - 44 IU/L  Lipid panel     Status: Abnormal   Collection Time: 01/12/24 10:38 AM  Result Value Ref Range   Cholesterol, Total 131 100 - 199 mg/dL   Triglycerides 63 0 - 149 mg/dL   HDL 32 (L) >60 mg/dL   VLDL Cholesterol Cal 13 5 - 40 mg/dL   LDL Chol Calc (NIH) 86 0 - 99 mg/dL    Chol/HDL Ratio 4.1 0.0 -  5.0 ratio    Comment:                                   T. Chol/HDL Ratio                                             Men  Women                               1/2 Avg.Risk  3.4    3.3                                   Avg.Risk  5.0    4.4                                2X Avg.Risk  9.6    7.1                                3X Avg.Risk 23.4   11.0   PSA     Status: None   Collection Time: 01/12/24 10:38 AM  Result Value Ref Range   Prostate Specific Ag, Serum 1.1 0.0 - 4.0 ng/mL    Comment: Roche ECLIA methodology. According to the American Urological Association, Serum PSA should decrease and remain at undetectable levels after radical prostatectomy. The AUA defines biochemical recurrence as an initial PSA value 0.2 ng/mL or greater followed by a subsequent confirmatory PSA value 0.2 ng/mL or greater. Values obtained with different assay methods or kits cannot be used interchangeably. Results cannot be interpreted as absolute evidence of the presence or absence of malignant disease.   Vitamin B12     Status: None   Collection Time: 01/12/24 10:38 AM  Result Value Ref Range   Vitamin B-12 639 232 - 1,245 pg/mL  Vitamin D  (25 hydroxy)     Status: None   Collection Time: 01/12/24 10:38 AM  Result Value Ref Range   Vit D, 25-Hydroxy 42.2 30.0 - 100.0 ng/mL    Comment: Vitamin D  deficiency has been defined by the Institute of Medicine and an Endocrine Society practice guideline as a level of serum 25-OH vitamin D  less than 20 ng/mL (1,2). The Endocrine Society went on to further define vitamin D  insufficiency as a level between 21 and 29 ng/mL (2). 1. IOM (Institute of Medicine). 2010. Dietary reference    intakes for calcium and D. [place]: The    [employer]. 2. Holick MF, [place], Bischoff-Ferrari HA, et al.    Evaluation, treatment, and prevention of vitamin D     deficiency: an Endocrine Society clinical practice    guideline.  JCEM. 2011 Jul; 96(7):1911-30.   POCT CBG (Fasting - Glucose)     Status: Normal   Collection Time: 02/19/24  1:42 PM  Result Value Ref Range   Glucose Fasting, POC 96 70 - 99 mg/dL       Assessment & Plan Type 2 diabetes mellitus with hyperglycemia, without long-term current use of insulin (HCC) Checking labs today. Will call pt. With results  Continue current diabetes POC, as patient has been well controlled on current regimen.  Will adjust meds if needed based on labs.   -CBC w/Diff -CMP w/eGFR -Hemoglobin A1C  Idiopathic gout, unspecified chronicity, unspecified site Patient stable.  Well controlled with current therapy.   Continue current meds.   Essential (primary) hypertension Blood pressure well controlled with current medications.  Continue current therapy.  Will reassess at follow up.   - CBC w/Diff - CMP w/eGFR  Neuropathy Patient stable.  Well controlled with current therapy.   Continue current meds.   BPH associated with nocturia Patient stable.  Well controlled with current therapy.   Continue current meds.   Umbilical hernia without obstruction and without gangrene Says this has been bothering him more, so I will set him up for referral to surgery.   Crohn's disease without complication, unspecified gastrointestinal tract location [facility]) Patient is seen by gastroenterology, who manage this condition.  He is well controlled with current therapy.   Will defer to them for further changes to plan of care.  Obesity, Class III, BMI 40-49.9 (morbid obesity) Continue current meds.  Will adjust as needed based on results.  The patient is asked to make an attempt to improve diet and exercise patterns to aid in medical management of this problem. Addressed importance of increasing and maintaining water intake.      No follow-ups on file.   Total time spent: 20 minutes  ALAN CHRISTELLA ARRANT, FNP  02/19/2024   This document may have been prepared  by [facility] Voice Recognition software and as such may include unintentional dictation errors.

## 2024-02-23 ENCOUNTER — Encounter: Payer: Self-pay | Admitting: Family

## 2024-02-23 NOTE — Assessment & Plan Note (Signed)
 Patient stable.  Well controlled with current therapy.   Continue current meds.

## 2024-02-23 NOTE — Assessment & Plan Note (Signed)
 Patient is seen by gastroenterology, who manage this condition.  He is well controlled with current therapy.   Will defer to them for further changes to plan of care.

## 2024-02-23 NOTE — Assessment & Plan Note (Signed)
 Continue current meds.  Will adjust as needed based on results.  The patient is asked to make an attempt to improve diet and exercise patterns to aid in medical management of this problem. Addressed importance of increasing and maintaining water  intake.

## 2024-02-23 NOTE — Assessment & Plan Note (Signed)
 Checking labs today. Will call pt. With results  Continue current diabetes POC, as patient has been well controlled on current regimen.  Will adjust meds if needed based on labs.   -CBC w/Diff -CMP w/eGFR -Hemoglobin A1C

## 2024-02-29 NOTE — Progress Notes (Unsigned)
 Patient ID: Isaiah Walker, male   DOB: Dec 28, 1945, 78 y.o.   MRN: [id number]  Chief Complaint: Umbilical hernia  History of Present Illness Isaiah Walker is a 78 y.o. male with approximately 1 year history of umbilical hernia.  Reports the mass is getting bigger, but typically goes away when resting.  He has some tenderness or pain primarily when he leans up against a sharp countertop.  Will often place a pad of a towel between the bathroom sink in his abdomen when he leans against it.  He denies any history of nausea, vomiting, fevers or chills.  Prior abdominal surgery includes a segment of small bowel resected along with a cholecystectomy.  He has lost approximately 50 pounds over the last 6 months or more on Mounjaro , however he feels like he has hit a wall and is no longer losing any additional weight.  He would like to consider pursuing hernia repair.  Past Medical History Past Medical History:  Diagnosis Date   Arthritis    BPH (benign prostatic hyperplasia)    Crohn's disease (HCC)    Depression    Diabetes mellitus without complication (HCC)    Hypertension    Kidney stones    Partial small bowel obstruction (HCC) 09/13/2020      Past Surgical History:  Procedure Laterality Date   CHOLECYSTECTOMY     CHOLECYSTECTOMY, LAPAROSCOPIC N/A    COLON SURGERY     Colectomy 2005   COLONOSCOPY N/A 01/11/2019   Procedure: COLONOSCOPY;  Surgeon: Janalyn Keene NOVAK, MD;  Location: [facility] ENDOSCOPY;  Service: Endoscopy;  Laterality: N/A;   COLONOSCOPY WITH PROPOFOL  N/A 02/11/2019   Procedure: COLONOSCOPY WITH PROPOFOL ;  Surgeon: Unk Corinn Skiff, MD;  Location: [facility] ENDOSCOPY;  Service: Gastroenterology;  Laterality: N/A;  Pediatric endoscope   CYSTOSCOPY W/ RETROGRADES Bilateral 04/01/2017   Procedure: CYSTOSCOPY WITH RETROGRADE PYELOGRAM;  Surgeon: Penne Knee, MD;  Location: [facility] ORS;  Service: Urology;  Laterality: Bilateral;   CYSTOSCOPY WITH STENT PLACEMENT Bilateral 04/01/2017    Procedure: CYSTOSCOPY WITH STENT PLACEMENT;  Surgeon: Penne Knee, MD;  Location: [facility] ORS;  Service: Urology;  Laterality: Bilateral;   CYSTOSCOPY/URETEROSCOPY/HOLMIUM LASER/STENT PLACEMENT Left 10/16/2014   Procedure: CYSTOSCOPY/URETEROSCOPY/HOLMIUM LASER/STENT PLACEMENT;  Surgeon: Knee Penne, MD;  Location: [facility] ORS;  Service: Urology;  Laterality: Left;   CYSTOSCOPY/URETEROSCOPY/HOLMIUM LASER/STENT PLACEMENT Bilateral 04/20/2017   Procedure: CYSTOSCOPY/URETEROSCOPY/HOLMIUM LASER/STENT EXCHANGE;  Surgeon: Penne Knee, MD;  Location: [facility] ORS;  Service: Urology;  Laterality: Bilateral;   HERNIA REPAIR     Percutaneous Left    Nephrolithotomy    No Known Allergies  Current Outpatient Medications  Medication Sig Dispense Refill   allopurinol  (ZYLOPRIM ) 300 MG tablet Take 1 tablet (300 mg total) by mouth daily. 90 tablet 0   amLODipine  (NORVASC ) 2.5 MG tablet Take 1 tablet (2.5 mg total) by mouth daily. 90 tablet 3   celecoxib  (CELEBREX ) 200 MG capsule Take 1 capsule (200 mg total) by mouth 2 (two) times daily. 180 capsule 2   finasteride  (PROSCAR ) 5 MG tablet Take 1 tablet (5 mg total) by mouth daily. 90 tablet 3   gabapentin  (NEURONTIN ) 300 MG capsule Take 1 capsule (300 mg total) by mouth 2 (two) times daily. 180 capsule 1   losartan  (COZAAR ) 100 MG tablet Take 1 tablet (100 mg total) by mouth daily. 90 tablet 3   metoprolol  tartrate (LOPRESSOR ) 50 MG tablet Take 1 tablet (50 mg total) by mouth 2 (two) times daily. 180 tablet 3   oxybutynin  (DITROPAN  XL) 15  MG 24 hr tablet Take 1 tablet (15 mg total) by mouth daily. 90 tablet 3   tirzepatide  (MOUNJARO ) 15 MG/0.5ML Pen Inject 15 mg into the skin once a week. 2 mL 3   traZODone  (DESYREL ) 150 MG tablet Take 1 tablet (150 mg total) by mouth at bedtime as needed for sleep. 90 tablet 0   No current facility-administered medications for this visit.    Family History Family History  Problem Relation Age of Onset   Parkinson's  disease Mother    Heart disease Father    Lymphoma Brother    Lung cancer Brother    Heart disease Brother       Social History Social History   Tobacco Use   Smoking status: Never    Passive exposure: Never   Smokeless tobacco: Never  Vaping Use   Vaping status: Never Used  Substance Use Topics   Alcohol use: No   Drug use: No        Review of Systems  Constitutional:  Positive for weight loss.  HENT: Negative.    Eyes: Negative.   Respiratory: Negative.    Cardiovascular:  Positive for leg swelling.  Gastrointestinal: Negative.   Genitourinary:  Positive for frequency.  Skin: Negative.   Neurological: Negative.   Psychiatric/Behavioral: Negative.       Physical Exam Blood pressure 135/78, pulse 66, temperature 98.3 F (36.8 C), temperature source Oral, height 5' 6 (1.676 m), weight 242 lb (109.8 kg), SpO2 96%. Last Weight  Most recent update: 03/01/2024  2:07 PM    Weight  109.8 kg (242 lb)             CONSTITUTIONAL: Well developed, and nourished, morbidly obese, appropriately responsive and aware without distress.   EYES: Sclera non-icteric.   EARS, NOSE, MOUTH AND THROAT:  The oropharynx is clear. Oral mucosa is pink and moist.   Hearing is intact to voice.  NECK: Trachea is midline, and there is no jugular venous distension.  LYMPH NODES:  Lymph nodes in the neck are not appreciated. RESPIRATORY:  Lungs are clear, and breath sounds are equal bilaterally.  Normal respiratory effort without pathologic use of accessory muscles. CARDIOVASCULAR: Heart is regular in rate and rhythm.   Well perfused.  GI: The abdomen is notable for diastases recti and a supraumbilical/right sided periumbilical mass, partially reducible, contents are consistent with omental tissue.  Otherwise soft, nontender, and nondistended. There were no other palpable masses.  I did not appreciate hepatosplenomegaly. MUSCULOSKELETAL:  Symmetrical muscle tone appreciated in all four  extremities.    SKIN: Skin turgor is normal. No pathologic skin lesions appreciated.  NEUROLOGIC:  Motor and sensation appear grossly normal.  Cranial nerves are grossly without defect. PSYCH:  Alert and oriented to person, place and time. Affect is appropriate for situation.  Data Reviewed I have personally reviewed what is currently available of the patient's imaging, recent labs and medical records.   Labs:     Latest Ref Rng & Units 10/31/2022    8:55 AM 07/29/2022    9:35 AM 09/16/2020    5:48 AM  CBC  WBC 3.4 - 10.8 x10E3/uL 7.5  7.9  5.9   Hemoglobin 13.0 - 17.7 g/dL 85.6  86.0  86.1   Hematocrit 37.5 - 51.0 % 43.6  41.6  40.9   Platelets 150 - 400 K/uL   154       Latest Ref Rng & Units 01/12/2024   10:38 AM 08/10/2023    9:23  AM 04/09/2023    9:37 AM  CMP  Glucose 70 - 99 mg/dL 897  93  894   BUN 8 - 27 mg/dL 21  23  24    Creatinine 0.76 - 1.27 mg/dL 8.84  8.79  8.99   Sodium 134 - 144 mmol/L 137  141  140   Potassium 3.5 - 5.2 mmol/L 4.5  5.1  4.4   Chloride 96 - 106 mmol/L 102  105  104   CO2 20 - 29 mmol/L 20  24  21    Calcium 8.6 - 10.2 mg/dL 9.0  9.5  8.8   Total Protein 6.0 - 8.5 g/dL 6.4  6.3  6.3   Total Bilirubin 0.0 - 1.2 mg/dL 0.7  0.4  0.4   Alkaline Phos 44 - 121 IU/L 34  31  33   AST 0 - 40 IU/L 10  10  14    ALT 0 - 44 IU/L 17  18  31      Imaging: Radiological images personally reviewed:   Within last 24 hrs: No results found.  Assessment    Patient Active Problem List   Diagnosis Date Noted   Umbilical hernia without obstruction and without gangrene 03/01/2024   BMI 39.0-39.9,adult 03/01/2024   Morbid (severe) obesity due to excess calories (HCC) 11/01/2023   Type 2 diabetes mellitus with hyperglycemia, without long-term current use of insulin (HCC) 11/01/2023   Idiopathic chronic gout of multiple sites without tophus 11/01/2023   Disorder of bursae of shoulder region 12/16/2022   Lumbosacral neuritis 12/11/2022   Pain of right hip 11/11/2022    Neuropathy 11/11/2022   Prediabetes 11/11/2022   HTN (hypertension) 09/13/2020   Crohn's disease (HCC) 09/13/2020   BPH associated with nocturia 09/13/2020   Obesity, Class III, BMI 40-49.9 (morbid obesity) 09/13/2020   Small bowel stricture (HCC)    Acute abdominal pain 01/09/2019   Acute cystitis with hematuria    Acute renal insufficiency    Kidney stone 04/01/2017    Plan    Will get a CT scan to better evaluate the size of the fascial defect.  We discussed options of further weight loss to enable primary hernia repair, versus proceeding with robotic assisted hernia repair that would have mesh reinforcement.  I believe at this point in time he would like to consider proceeding with what ever repair I decided upon, however am not sure he fully appreciates the risks involved at this time.  I will ensure reviewing them again on her next visit.    I personally spent a total of 45 minutes in the care of the patient today including preparing to see the patient, getting/reviewing separately obtained history, performing a medically appropriate exam/evaluation, counseling and educating, placing orders, and documenting clinical information in the EHR.   These notes generated with voice recognition software. I apologize for typographical errors.  Isaiah Walker M.D., FACS 03/01/2024, 2:35 PM

## 2024-03-01 ENCOUNTER — Ambulatory Visit: Payer: Self-pay | Admitting: Surgery

## 2024-03-01 ENCOUNTER — Encounter: Payer: Self-pay | Admitting: Surgery

## 2024-03-01 VITALS — BP 135/78 | HR 66 | Temp 98.3°F | Ht 66.0 in | Wt 242.0 lb

## 2024-03-01 DIAGNOSIS — K429 Umbilical hernia without obstruction or gangrene: Secondary | ICD-10-CM | POA: Diagnosis not present

## 2024-03-01 DIAGNOSIS — Z6839 Body mass index (BMI) 39.0-39.9, adult: Secondary | ICD-10-CM | POA: Insufficient documentation

## 2024-03-01 NOTE — Patient Instructions (Signed)
 We have scheduled you for a CT Scan of your Abdomen and Pelvis with contrast. This has been [facility] 03[facility]08[facility]24 . Please arrive there by 12:15pm for a 12:30pm appoitment, Also nothing to eat or drink 4hrs Prior to scan . If you need to reschedule your Scan, you may do so by calling [phone]. Please let us  know if you reschedule your scan as we have to get authorization from your insurance for this.         We will see you after the CT scan to determine surgical approach       Hernia, Adult     A hernia happens when an organ or tissue inside your body pushes out through a weak spot in the muscles of your belly. This makes a bulge. The bulge may be: In a scar from surgery. This type of bulge is called an incisional hernia. Near your belly button. This type is called an umbilical hernia. In your groin, which is the area where your leg meets your lower belly. This type is called an inguinal hernia. If you're male, this type could also be in your scrotum. In your upper thigh. This type is called a femoral hernia. Inside your belly. This type is called a hiatal hernia. It happens when your stomach slides above your diaphragm, which is the muscle between your belly and your chest. What are the causes? You may get a hernia if: You lift heavy things. You cough over a long period of time. You have trouble pooping, also called constipation. Trouble pooping can lead to straining. There's a cut from surgery in your belly. You have a problem that's present at birth. There's fluid around your belly. You're male and have a testicle that hasn't moved down into your scrotum. You may be at greater risk for hernia if: You smoke. You're very overweight. What are the signs or symptoms? The main symptom is a bulge, but the bulge may not always be seen. It may grow bigger or be easier to see when you cough or strain, such as when you lift something heavy. If you can  push the bulge back into your belly, it may not cause pain. If you can't push it back into your belly, it may lose its blood supply. This can cause: Pain. Fever. Nausea and vomiting. Swelling. Trouble pooping. How is this diagnosed? A hernia may be diagnosed based on your symptoms, medical history, and an exam. Your health care provider may ask you to cough or move in a way that makes the bulge easier to see. You may also have tests done. These may include: X-rays. Ultrasound. CT scan. How is this treated? A hernia that's small and painless may not need to be treated. A hernia that's large or painful may be treated with surgery. Surgery involves pushing the bulge back into place and fixing the weak area of the muscle or belly. Follow these instructions at home: Activity Try not to strain. You may have to avoid lifting. Ask how much weight you can safely lift. If you lift something heavy, use your leg muscles. Do not use your back muscles to lift. Prevent trouble pooping You may need to take these actions to prevent or treat trouble pooping: Drink enough fluid to keep your pee (urine) pale yellow. Take medicines to help you poop. Eat foods high in fiber. These include beans, whole grains, and fresh fruits and vegetables. General instructions When you cough, try to cough  gently. Try to push the bulge back in by very gently pressing on it when you're lying down. Do not try to force it back in if it won't push in easily. If you're overweight, work with your provider to lose weight safely. Do not smoke, vape, or use products with nicotine or tobacco in them. If you need help quitting, talk with your provider. If you're going to have surgery, watch your hernia for changes in shape, size, or color. Tell your provider about any changes. Contact a health care provider if: You get new pain, swelling, or redness near your hernia. You have trouble pooping. Your poop is harder or larger than  normal. You have a fever or chills. You have nausea or vomiting. Your hernia can't be pushed in. Get help right away if: You have belly pain that gets worse. Your hernia: Changes in shape or size. Changes color. Feels hard or hurts when you touch it. These symptoms may be an emergency. Call 911 right away. Do not wait to see if the symptoms will go away. Do not drive yourself to the hospital. This information is not intended to replace advice given to you by your health care provider. Make sure you discuss any questions you have with your health care provider. Document Revised: 03/05/2023 Document Reviewed: 08/26/2022 Elsevier Patient Education  2024 [employer].

## 2024-03-08 ENCOUNTER — Ambulatory Visit
Admission: RE | Admit: 2024-03-08 | Discharge: 2024-03-08 | Disposition: A | Discharge status: Home / Self Care | Source: Ambulatory Visit | Attending: Surgery | Admitting: Surgery

## 2024-03-08 DIAGNOSIS — K429 Umbilical hernia without obstruction or gangrene: Secondary | ICD-10-CM | POA: Diagnosis present

## 2024-03-08 LAB — POCT I-STAT CREATININE: Creatinine, Ser: 1.1 mg/dL (ref 0.61–1.24)

## 2024-03-08 MED ORDER — IOHEXOL 300 MG/ML  SOLN
100.0000 mL | Freq: Once | INTRAMUSCULAR | Status: AC | PRN
  Administered 2024-03-08: 100 mL via INTRAVENOUS

## 2024-03-13 NOTE — Progress Notes (Unsigned)
 Patient ID: Isaiah Walker, male   DOB: January 15, 1946, 78 y.o.   MRN: [id number]  Chief Complaint: Umbilical hernia  History of Present Illness Isaiah Walker is a 78 y.o. male with approximately 1 year history of umbilical hernia.  Reports the mass is getting bigger, but typically goes away when resting.  He has some tenderness or pain primarily when he leans up against a sharp countertop.  Will often place a pad of a towel between the bathroom sink in his abdomen when he leans against it.  He denies any history of nausea, vomiting, fevers or chills.  Prior abdominal surgery includes a segment of small bowel resected along with a cholecystectomy.  He has lost approximately 50 pounds over the last 6 months or more on Mounjaro , however he feels like he has hit a wall and is no longer losing any additional weight.  He would like to schedule hernia repair.  Past Medical History Past Medical History:  Diagnosis Date   Arthritis    BPH (benign prostatic hyperplasia)    Crohn's disease (HCC)    Depression    Diabetes mellitus without complication (HCC)    Hypertension    Kidney stones    Partial small bowel obstruction (HCC) 09/13/2020      Past Surgical History:  Procedure Laterality Date   CHOLECYSTECTOMY     CHOLECYSTECTOMY, LAPAROSCOPIC N/A    COLON SURGERY     Colectomy 2005   COLONOSCOPY N/A 01/11/2019   Procedure: COLONOSCOPY;  Surgeon: Janalyn Keene NOVAK, MD;  Location: [facility] ENDOSCOPY;  Service: Endoscopy;  Laterality: N/A;   COLONOSCOPY WITH PROPOFOL  N/A 02/11/2019   Procedure: COLONOSCOPY WITH PROPOFOL ;  Surgeon: Unk Corinn Skiff, MD;  Location: [facility] ENDOSCOPY;  Service: Gastroenterology;  Laterality: N/A;  Pediatric endoscope   CYSTOSCOPY W/ RETROGRADES Bilateral 04/01/2017   Procedure: CYSTOSCOPY WITH RETROGRADE PYELOGRAM;  Surgeon: Penne Knee, MD;  Location: [facility] ORS;  Service: Urology;  Laterality: Bilateral;   CYSTOSCOPY WITH STENT PLACEMENT Bilateral 04/01/2017   Procedure:  CYSTOSCOPY WITH STENT PLACEMENT;  Surgeon: Penne Knee, MD;  Location: [facility] ORS;  Service: Urology;  Laterality: Bilateral;   CYSTOSCOPY/URETEROSCOPY/HOLMIUM LASER/STENT PLACEMENT Left 10/16/2014   Procedure: CYSTOSCOPY/URETEROSCOPY/HOLMIUM LASER/STENT PLACEMENT;  Surgeon: Knee Penne, MD;  Location: [facility] ORS;  Service: Urology;  Laterality: Left;   CYSTOSCOPY/URETEROSCOPY/HOLMIUM LASER/STENT PLACEMENT Bilateral 04/20/2017   Procedure: CYSTOSCOPY/URETEROSCOPY/HOLMIUM LASER/STENT EXCHANGE;  Surgeon: Penne Knee, MD;  Location: [facility] ORS;  Service: Urology;  Laterality: Bilateral;   HERNIA REPAIR     Percutaneous Left    Nephrolithotomy    No Known Allergies  Current Outpatient Medications  Medication Sig Dispense Refill   allopurinol  (ZYLOPRIM ) 300 MG tablet Take 1 tablet (300 mg total) by mouth daily. 90 tablet 0   amLODipine  (NORVASC ) 2.5 MG tablet Take 1 tablet (2.5 mg total) by mouth daily. 90 tablet 3   celecoxib  (CELEBREX ) 200 MG capsule Take 1 capsule (200 mg total) by mouth 2 (two) times daily. 180 capsule 2   finasteride  (PROSCAR ) 5 MG tablet Take 1 tablet (5 mg total) by mouth daily. 90 tablet 3   gabapentin  (NEURONTIN ) 300 MG capsule Take 1 capsule (300 mg total) by mouth 2 (two) times daily. 180 capsule 1   losartan  (COZAAR ) 100 MG tablet Take 1 tablet (100 mg total) by mouth daily. 90 tablet 3   metoprolol  tartrate (LOPRESSOR ) 50 MG tablet Take 1 tablet (50 mg total) by mouth 2 (two) times daily. 180 tablet 3   oxybutynin  (DITROPAN  XL) 15 MG  24 hr tablet Take 1 tablet (15 mg total) by mouth daily. 90 tablet 3   tirzepatide  (MOUNJARO ) 15 MG/0.5ML Pen Inject 15 mg into the skin once a week. 2 mL 3   traZODone  (DESYREL ) 150 MG tablet Take 1 tablet (150 mg total) by mouth at bedtime as needed for sleep. 90 tablet 0   No current facility-administered medications for this visit.    Family History Family History  Problem Relation Age of Onset   Parkinson's disease Mother     Heart disease Father    Lymphoma Brother    Lung cancer Brother    Heart disease Brother       Social History Social History   Tobacco Use   Smoking status: Never    Passive exposure: Never   Smokeless tobacco: Never  Vaping Use   Vaping status: Never Used  Substance Use Topics   Alcohol use: No   Drug use: No        Review of Systems  Constitutional:  Positive for weight loss.  HENT: Negative.    Eyes: Negative.   Respiratory: Negative.    Cardiovascular:  Positive for leg swelling.  Gastrointestinal: Negative.   Genitourinary:  Positive for frequency.  Skin: Negative.   Neurological: Negative.   Psychiatric/Behavioral: Negative.       Physical Exam Blood pressure 136/79, pulse 69, temperature 97.6 F (36.4 C), temperature source Oral, height 5' 6 (1.676 m), weight 236 lb 9.6 oz (107.3 kg), SpO2 97%. Last Weight  Most recent update: 03/17/2024  9:26 AM    Weight  107.3 kg (236 lb 9.6 oz)              CONSTITUTIONAL: Well developed, and nourished, morbidly obese, appropriately responsive and aware without distress.   EYES: Sclera non-icteric.   EARS, NOSE, MOUTH AND THROAT:  The oropharynx is clear. Oral mucosa is pink and moist.   Hearing is intact to voice.  NECK: Trachea is midline, and there is no jugular venous distension.  LYMPH NODES:  Lymph nodes in the neck are not appreciated. RESPIRATORY:  Lungs are clear, and breath sounds are equal bilaterally.  Normal respiratory effort without pathologic use of accessory muscles. CARDIOVASCULAR: Heart is regular in rate and rhythm.   Well perfused.  GI: The abdomen is notable for diastases recti and a supraumbilical/right sided periumbilical mass, partially reducible, contents are consistent with omental tissue.  Otherwise soft, nontender, and nondistended. There were no other palpable masses.  I did not appreciate hepatosplenomegaly. MUSCULOSKELETAL:  Symmetrical muscle tone appreciated in all four  extremities.    SKIN: Skin turgor is normal. No pathologic skin lesions appreciated.  NEUROLOGIC:  Motor and sensation appear grossly normal.  Cranial nerves are grossly without defect. PSYCH:  Alert and oriented to person, place and time. Affect is appropriate for situation.  Data Reviewed I have personally reviewed what is currently available of the patient's imaging, recent labs and medical records.   Labs:     Latest Ref Rng & Units 10/31/2022    8:55 AM 07/29/2022    9:35 AM 09/16/2020    5:48 AM  CBC  WBC 3.4 - 10.8 x10E3/uL 7.5  7.9  5.9   Hemoglobin 13.0 - 17.7 g/dL 85.6  86.0  86.1   Hematocrit 37.5 - 51.0 % 43.6  41.6  40.9   Platelets 150 - 400 K/uL   154       Latest Ref Rng & Units 03/08/2024   12:22 PM 01/12/2024  10:38 AM 08/10/2023    9:23 AM  CMP  Glucose 70 - 99 mg/dL  897  93   BUN 8 - 27 mg/dL  21  23   Creatinine 9.38 - 1.24 mg/dL 8.89  8.84  8.79   Sodium 134 - 144 mmol/L  137  141   Potassium 3.5 - 5.2 mmol/L  4.5  5.1   Chloride 96 - 106 mmol/L  102  105   CO2 20 - 29 mmol/L  20  24   Calcium 8.6 - 10.2 mg/dL  9.0  9.5   Total Protein 6.0 - 8.5 g/dL  6.4  6.3   Total Bilirubin 0.0 - 1.2 mg/dL  0.7  0.4   Alkaline Phos 44 - 121 IU/L  34  31   AST 0 - 40 IU/L  10  10   ALT 0 - 44 IU/L  17  18     Imaging: Radiological images personally reviewed:    CLINICAL DATA:  Umbilical hernia   EXAM: CT ABDOMEN AND PELVIS WITH CONTRAST   TECHNIQUE: Multidetector CT imaging of the abdomen and pelvis was performed using the standard protocol following bolus administration of intravenous contrast.   RADIATION DOSE REDUCTION: This exam was performed according to the departmental dose-optimization program which includes automated exposure control, adjustment of the mA and/or kV according to patient size and/or use of iterative reconstruction technique.   CONTRAST:  OMNIPAQUE  IOHEXOL  300 MG/ML  SOLN   COMPARISON:  09/13/2020   FINDINGS: Lower chest:  No acute abnormality. Coronary artery and aortic atherosclerosis.   Hepatobiliary: No focal liver abnormality is seen. Status post cholecystectomy. No biliary dilatation.   Pancreas: No focal abnormality or ductal dilatation.   Spleen: No focal abnormality.  Normal size.   Adrenals/Urinary Tract: 2 cm stone in the lower pole of the left kidney. No ureteral stones or hydronephrosis. 7.4 cm right renal cyst appears benign. No follow-up imaging recommended. Adrenal glands unremarkable. Urinary bladder normal.   Stomach/Bowel: Stomach, large and small bowel grossly unremarkable.   Vascular/Lymphatic: Aortic atherosclerosis. No evidence of aneurysm or adenopathy.   Reproductive: No visible focal abnormality.   Other: No free fluid or free air. Moderate-sized umbilical hernia containing fat. Hernia sac measures up to 5.2 cm in diameter.   Musculoskeletal: No acute bony abnormality.   IMPRESSION: Moderate-sized umbilical hernia containing fat.   Coronary artery and aortic atherosclerosis.   Left lower pole nephrolithiasis.  No hydronephrosis.   No acute findings.     Electronically Signed   By: Franky Crease M.D.   On: 03/13/2024 20:27  Within last 24 hrs: No results found.  Assessment  Anterior abdominal wall hernia approximately 3 cm diameter fascial defect, initial, incarcerated. Patient Active Problem List   Diagnosis Date Noted   Umbilical hernia without obstruction and without gangrene 03/01/2024   BMI 39.0-39.9,adult 03/01/2024   Morbid (severe) obesity due to excess calories (HCC) 11/01/2023   Type 2 diabetes mellitus with hyperglycemia, without long-term current use of insulin (HCC) 11/01/2023   Idiopathic chronic gout of multiple sites without tophus 11/01/2023   Disorder of bursae of shoulder region 12/16/2022   Lumbosacral neuritis 12/11/2022   Pain of right hip 11/11/2022   Neuropathy 11/11/2022   Prediabetes 11/11/2022   HTN (hypertension) 09/13/2020    Crohn's disease (HCC) 09/13/2020   BPH associated with nocturia 09/13/2020   Obesity, Class III, BMI 40-49.9 (morbid obesity) (HCC) 09/13/2020   Small bowel stricture (HCC)    Acute  abdominal pain 01/09/2019   Acute cystitis with hematuria    Acute renal insufficiency    Kidney stone 04/01/2017    Plan     He would like to pursue robotic intra-abdominal wall hernia repair with mesh.  Due to the symptomatic nature of it.  Will hold his Mounjaro  1 week prior to surgery  I discussed possibility of incarceration, strangulation, enlargement in size over time, and the need for emergency surgery in the face of these.  Also reviewed the techniques of reduction should incarceration occur, and when unsuccessful to present to the ED.  Also discussed that surgery risks include recurrence which can be up to 30% in the case of complex hernias, use of prosthetic materials (mesh) and the increased risk of infection and the possible need for re-operation and removal of mesh, possibility of post-op SBO or ileus, and the risks of general anesthetic including heart attack, stroke, sudden death or some reaction to anesthetic medications. The patient, and those present, appear to understand the risks, any and all questions were answered to the patient's satisfaction.  No guarantees were ever expressed or implied.     I personally spent a total of 45 minutes in the care of the patient today including preparing to see the patient, getting/reviewing separately obtained history, performing a medically appropriate exam/evaluation, counseling and educating, placing orders, and documenting clinical information in the EHR.   These notes generated with voice recognition software. I apologize for typographical errors.  Honor Leghorn M.D., FACS 03/17/2024, 12:11 PM

## 2024-03-13 NOTE — H&P (View-Only) (Signed)
 Patient ID: Isaiah Walker, male   DOB: 1946/03/03, 78 y.o.   MRN: [id number]  Chief Complaint: Umbilical hernia  History of Present Illness Isaiah Walker is a 78 y.o. male with approximately 1 year history of umbilical hernia.  Reports the mass is getting bigger, but typically goes away when resting.  He has some tenderness or pain primarily when he leans up against a sharp countertop.  Will often place a pad of a towel between the bathroom sink in his abdomen when he leans against it.  He denies any history of nausea, vomiting, fevers or chills.  Prior abdominal surgery includes a segment of small bowel resected along with a cholecystectomy.  He has lost approximately 50 pounds over the last 6 months or more on Mounjaro , however he feels like he has hit a wall and is no longer losing any additional weight.  He would like to schedule hernia repair.  Past Medical History Past Medical History:  Diagnosis Date   Arthritis    BPH (benign prostatic hyperplasia)    Crohn's disease (HCC)    Depression    Diabetes mellitus without complication (HCC)    Hypertension    Kidney stones    Partial small bowel obstruction (HCC) 09/13/2020      Past Surgical History:  Procedure Laterality Date   CHOLECYSTECTOMY     CHOLECYSTECTOMY, LAPAROSCOPIC N/A    COLON SURGERY     Colectomy 2005   COLONOSCOPY N/A 01/11/2019   Procedure: COLONOSCOPY;  Surgeon: Janalyn Keene NOVAK, MD;  Location: [facility] ENDOSCOPY;  Service: Endoscopy;  Laterality: N/A;   COLONOSCOPY WITH PROPOFOL  N/A 02/11/2019   Procedure: COLONOSCOPY WITH PROPOFOL ;  Surgeon: Unk Corinn Skiff, MD;  Location: [facility] ENDOSCOPY;  Service: Gastroenterology;  Laterality: N/A;  Pediatric endoscope   CYSTOSCOPY W/ RETROGRADES Bilateral 04/01/2017   Procedure: CYSTOSCOPY WITH RETROGRADE PYELOGRAM;  Surgeon: Penne Knee, MD;  Location: [facility] ORS;  Service: Urology;  Laterality: Bilateral;   CYSTOSCOPY WITH STENT PLACEMENT Bilateral 04/01/2017   Procedure:  CYSTOSCOPY WITH STENT PLACEMENT;  Surgeon: Penne Knee, MD;  Location: [facility] ORS;  Service: Urology;  Laterality: Bilateral;   CYSTOSCOPY/URETEROSCOPY/HOLMIUM LASER/STENT PLACEMENT Left 10/16/2014   Procedure: CYSTOSCOPY/URETEROSCOPY/HOLMIUM LASER/STENT PLACEMENT;  Surgeon: Knee Penne, MD;  Location: [facility] ORS;  Service: Urology;  Laterality: Left;   CYSTOSCOPY/URETEROSCOPY/HOLMIUM LASER/STENT PLACEMENT Bilateral 04/20/2017   Procedure: CYSTOSCOPY/URETEROSCOPY/HOLMIUM LASER/STENT EXCHANGE;  Surgeon: Penne Knee, MD;  Location: [facility] ORS;  Service: Urology;  Laterality: Bilateral;   HERNIA REPAIR     Percutaneous Left    Nephrolithotomy    No Known Allergies  Current Outpatient Medications  Medication Sig Dispense Refill   allopurinol  (ZYLOPRIM ) 300 MG tablet Take 1 tablet (300 mg total) by mouth daily. 90 tablet 0   amLODipine  (NORVASC ) 2.5 MG tablet Take 1 tablet (2.5 mg total) by mouth daily. 90 tablet 3   celecoxib  (CELEBREX ) 200 MG capsule Take 1 capsule (200 mg total) by mouth 2 (two) times daily. 180 capsule 2   finasteride  (PROSCAR ) 5 MG tablet Take 1 tablet (5 mg total) by mouth daily. 90 tablet 3   gabapentin  (NEURONTIN ) 300 MG capsule Take 1 capsule (300 mg total) by mouth 2 (two) times daily. 180 capsule 1   losartan  (COZAAR ) 100 MG tablet Take 1 tablet (100 mg total) by mouth daily. 90 tablet 3   metoprolol  tartrate (LOPRESSOR ) 50 MG tablet Take 1 tablet (50 mg total) by mouth 2 (two) times daily. 180 tablet 3   oxybutynin  (DITROPAN  XL) 15 MG  24 hr tablet Take 1 tablet (15 mg total) by mouth daily. 90 tablet 3   tirzepatide  (MOUNJARO ) 15 MG/0.5ML Pen Inject 15 mg into the skin once a week. 2 mL 3   traZODone  (DESYREL ) 150 MG tablet Take 1 tablet (150 mg total) by mouth at bedtime as needed for sleep. 90 tablet 0   No current facility-administered medications for this visit.    Family History Family History  Problem Relation Age of Onset   Parkinson's disease Mother     Heart disease Father    Lymphoma Brother    Lung cancer Brother    Heart disease Brother       Social History Social History   Tobacco Use   Smoking status: Never    Passive exposure: Never   Smokeless tobacco: Never  Vaping Use   Vaping status: Never Used  Substance Use Topics   Alcohol use: No   Drug use: No        Review of Systems  Constitutional:  Positive for weight loss.  HENT: Negative.    Eyes: Negative.   Respiratory: Negative.    Cardiovascular:  Positive for leg swelling.  Gastrointestinal: Negative.   Genitourinary:  Positive for frequency.  Skin: Negative.   Neurological: Negative.   Psychiatric/Behavioral: Negative.       Physical Exam Blood pressure 136/79, pulse 69, temperature 97.6 F (36.4 C), temperature source Oral, height 5' 6 (1.676 m), weight 236 lb 9.6 oz (107.3 kg), SpO2 97%. Last Weight  Most recent update: 03/17/2024  9:26 AM    Weight  107.3 kg (236 lb 9.6 oz)              CONSTITUTIONAL: Well developed, and nourished, morbidly obese, appropriately responsive and aware without distress.   EYES: Sclera non-icteric.   EARS, NOSE, MOUTH AND THROAT:  The oropharynx is clear. Oral mucosa is pink and moist.   Hearing is intact to voice.  NECK: Trachea is midline, and there is no jugular venous distension.  LYMPH NODES:  Lymph nodes in the neck are not appreciated. RESPIRATORY:  Lungs are clear, and breath sounds are equal bilaterally.  Normal respiratory effort without pathologic use of accessory muscles. CARDIOVASCULAR: Heart is regular in rate and rhythm.   Well perfused.  GI: The abdomen is notable for diastases recti and a supraumbilical/right sided periumbilical mass, partially reducible, contents are consistent with omental tissue.  Otherwise soft, nontender, and nondistended. There were no other palpable masses.  I did not appreciate hepatosplenomegaly. MUSCULOSKELETAL:  Symmetrical muscle tone appreciated in all four  extremities.    SKIN: Skin turgor is normal. No pathologic skin lesions appreciated.  NEUROLOGIC:  Motor and sensation appear grossly normal.  Cranial nerves are grossly without defect. PSYCH:  Alert and oriented to person, place and time. Affect is appropriate for situation.  Data Reviewed I have personally reviewed what is currently available of the patient's imaging, recent labs and medical records.   Labs:     Latest Ref Rng & Units 10/31/2022    8:55 AM 07/29/2022    9:35 AM 09/16/2020    5:48 AM  CBC  WBC 3.4 - 10.8 x10E3/uL 7.5  7.9  5.9   Hemoglobin 13.0 - 17.7 g/dL 85.6  86.0  86.1   Hematocrit 37.5 - 51.0 % 43.6  41.6  40.9   Platelets 150 - 400 K/uL   154       Latest Ref Rng & Units 03/08/2024   12:22 PM 01/12/2024  10:38 AM 08/10/2023    9:23 AM  CMP  Glucose 70 - 99 mg/dL  897  93   BUN 8 - 27 mg/dL  21  23   Creatinine 9.38 - 1.24 mg/dL 8.89  8.84  8.79   Sodium 134 - 144 mmol/L  137  141   Potassium 3.5 - 5.2 mmol/L  4.5  5.1   Chloride 96 - 106 mmol/L  102  105   CO2 20 - 29 mmol/L  20  24   Calcium 8.6 - 10.2 mg/dL  9.0  9.5   Total Protein 6.0 - 8.5 g/dL  6.4  6.3   Total Bilirubin 0.0 - 1.2 mg/dL  0.7  0.4   Alkaline Phos 44 - 121 IU/L  34  31   AST 0 - 40 IU/L  10  10   ALT 0 - 44 IU/L  17  18     Imaging: Radiological images personally reviewed:    CLINICAL DATA:  Umbilical hernia   EXAM: CT ABDOMEN AND PELVIS WITH CONTRAST   TECHNIQUE: Multidetector CT imaging of the abdomen and pelvis was performed using the standard protocol following bolus administration of intravenous contrast.   RADIATION DOSE REDUCTION: This exam was performed according to the departmental dose-optimization program which includes automated exposure control, adjustment of the mA and/or kV according to patient size and/or use of iterative reconstruction technique.   CONTRAST:  OMNIPAQUE  IOHEXOL  300 MG/ML  SOLN   COMPARISON:  09/13/2020   FINDINGS: Lower chest:  No acute abnormality. Coronary artery and aortic atherosclerosis.   Hepatobiliary: No focal liver abnormality is seen. Status post cholecystectomy. No biliary dilatation.   Pancreas: No focal abnormality or ductal dilatation.   Spleen: No focal abnormality.  Normal size.   Adrenals/Urinary Tract: 2 cm stone in the lower pole of the left kidney. No ureteral stones or hydronephrosis. 7.4 cm right renal cyst appears benign. No follow-up imaging recommended. Adrenal glands unremarkable. Urinary bladder normal.   Stomach/Bowel: Stomach, large and small bowel grossly unremarkable.   Vascular/Lymphatic: Aortic atherosclerosis. No evidence of aneurysm or adenopathy.   Reproductive: No visible focal abnormality.   Other: No free fluid or free air. Moderate-sized umbilical hernia containing fat. Hernia sac measures up to 5.2 cm in diameter.   Musculoskeletal: No acute bony abnormality.   IMPRESSION: Moderate-sized umbilical hernia containing fat.   Coronary artery and aortic atherosclerosis.   Left lower pole nephrolithiasis.  No hydronephrosis.   No acute findings.     Electronically Signed   By: Franky Crease M.D.   On: 03/13/2024 20:27  Within last 24 hrs: No results found.  Assessment  Anterior abdominal wall hernia approximately 3 cm diameter fascial defect, initial, incarcerated. Patient Active Problem List   Diagnosis Date Noted   Umbilical hernia without obstruction and without gangrene 03/01/2024   BMI 39.0-39.9,adult 03/01/2024   Morbid (severe) obesity due to excess calories (HCC) 11/01/2023   Type 2 diabetes mellitus with hyperglycemia, without long-term current use of insulin (HCC) 11/01/2023   Idiopathic chronic gout of multiple sites without tophus 11/01/2023   Disorder of bursae of shoulder region 12/16/2022   Lumbosacral neuritis 12/11/2022   Pain of right hip 11/11/2022   Neuropathy 11/11/2022   Prediabetes 11/11/2022   HTN (hypertension) 09/13/2020    Crohn's disease (HCC) 09/13/2020   BPH associated with nocturia 09/13/2020   Obesity, Class III, BMI 40-49.9 (morbid obesity) (HCC) 09/13/2020   Small bowel stricture (HCC)    Acute  abdominal pain 01/09/2019   Acute cystitis with hematuria    Acute renal insufficiency    Kidney stone 04/01/2017    Plan     He would like to pursue robotic intra-abdominal wall hernia repair with mesh.  Due to the symptomatic nature of it.  Will hold his Mounjaro  1 week prior to surgery  I discussed possibility of incarceration, strangulation, enlargement in size over time, and the need for emergency surgery in the face of these.  Also reviewed the techniques of reduction should incarceration occur, and when unsuccessful to present to the ED.  Also discussed that surgery risks include recurrence which can be up to 30% in the case of complex hernias, use of prosthetic materials (mesh) and the increased risk of infection and the possible need for re-operation and removal of mesh, possibility of post-op SBO or ileus, and the risks of general anesthetic including heart attack, stroke, sudden death or some reaction to anesthetic medications. The patient, and those present, appear to understand the risks, any and all questions were answered to the patient's satisfaction.  No guarantees were ever expressed or implied.     I personally spent a total of 45 minutes in the care of the patient today including preparing to see the patient, getting/reviewing separately obtained history, performing a medically appropriate exam/evaluation, counseling and educating, placing orders, and documenting clinical information in the EHR.   These notes generated with voice recognition software. I apologize for typographical errors.  Honor Leghorn M.D., FACS 03/17/2024, 12:11 PM

## 2024-03-17 ENCOUNTER — Ambulatory Visit: Admitting: Surgery

## 2024-03-17 ENCOUNTER — Ambulatory Visit: Payer: Self-pay | Admitting: Surgery

## 2024-03-17 ENCOUNTER — Encounter: Payer: Self-pay | Admitting: Surgery

## 2024-03-17 ENCOUNTER — Telehealth: Payer: Self-pay | Admitting: Surgery

## 2024-03-17 VITALS — BP 136/79 | HR 69 | Temp 97.6°F | Ht 66.0 in | Wt 236.6 lb

## 2024-03-17 DIAGNOSIS — K436 Other and unspecified ventral hernia with obstruction, without gangrene: Secondary | ICD-10-CM | POA: Diagnosis not present

## 2024-03-17 NOTE — Telephone Encounter (Signed)
 Patient has been advised of Pre-Admission date/time, and Surgery date at [facility].  Surgery Date: 03/30/24 Preadmission Testing Date: 03/24/24 (phone 1p-4p)  Patient informed of the scheduling process and surgery information is given at time of office visit.    Patient also reminded to stop his Mounjaro  1 week before surgery.     Patient has been made aware to call [phone], between 1-3:00pm the day before surgery, to find out what time to arrive for surgery.

## 2024-03-17 NOTE — Patient Instructions (Signed)
 You have requested for your Umbilical Hernia be repaired. This will be scheduled with Dr. Ofilia Benton at [facility].   If you are on any injectable weight loss medication, you will need to stop taking your GLP-1 injectable (weight loss) medications 8 days before your surgery to avoid any complications with anesthesia.   Please see your (blue)pre-care sheet for information. Our surgery scheduler will call you to verify surgery date and to go over information.   You will need to arrange to be off work for 1-2 weeks but will have to have a lifting restriction of no more than 15 lbs for 6 weeks following your surgery. If you have FMLA or disability paperwork that needs filled out you may drop this off at our office or this can be faxed to [phone].     Umbilical Hernia, Adult A hernia is a bulge of tissue that pushes through an opening between muscles. An umbilical hernia happens in the abdomen, near the belly button (umbilicus). The hernia may contain tissues from the small intestine, large intestine, or fatty tissue covering the intestines (omentum). Umbilical hernias in adults tend to get worse over time, and they require surgical treatment. There are several types of umbilical hernias. You may have: A hernia located just above or below the umbilicus (indirect hernia). This is the most common type of umbilical hernia in adults. A hernia that forms through an opening formed by the umbilicus (direct hernia). A hernia that comes and goes (reducible hernia). A reducible hernia may be visible only when you strain, lift something heavy, or cough. This type of hernia can be pushed back into the abdomen (reduced). A hernia that traps abdominal tissue inside the hernia (incarcerated hernia). This type of hernia cannot be reduced. A hernia that cuts off blood flow to the tissues inside the hernia (strangulated hernia). The tissues can start to die if this happens. This type of  hernia requires emergency treatment.  What are the causes? An umbilical hernia happens when tissue inside the abdomen presses on a weak area of the abdominal muscles. What increases the risk? You may have a greater risk of this condition if you: Are obese. Have had several pregnancies. Have a buildup of fluid inside your abdomen (ascites). Have had surgery that weakens the abdominal muscles.  What are the signs or symptoms? The main symptom of this condition is a painless bulge at or near the belly button. A reducible hernia may be visible only when you strain, lift something heavy, or cough. Other symptoms may include: Dull pain. A feeling of pressure.  Symptoms of a strangulated hernia may include: Pain that gets increasingly worse. Nausea and vomiting. Pain when pressing on the hernia. Skin over the hernia becoming red or purple. Constipation. Blood in the stool.  How is this diagnosed? This condition may be diagnosed based on: A physical exam. You may be asked to cough or strain while standing. These actions increase the pressure inside your abdomen and force the hernia through the opening in your muscles. Your health care provider may try to reduce the hernia by pressing on it. Your symptoms and medical history.  How is this treated? Surgery is the only treatment for an umbilical hernia. Surgery for a strangulated hernia is done as soon as possible. If you have a small hernia that is not incarcerated, you may need to lose weight before having surgery. Follow these instructions at home: Lose weight, if told by your health care provider.  Do not try to push the hernia back in. Watch your hernia for any changes in color or size. Tell your health care provider if any changes occur. You may need to avoid activities that increase pressure on your hernia. Do not lift anything that is heavier than 10 lb (4.5 kg) until your health care provider says that this is safe. Take  over-the-counter and prescription medicines only as told by your health care provider. Keep all follow-up visits as told by your health care provider. This is important. Contact a health care provider if: Your hernia gets larger. Your hernia becomes painful. Get help right away if: You develop sudden, severe pain near the area of your hernia. You have pain as well as nausea or vomiting. You have pain and the skin over your hernia changes color. You develop a fever. This information is not intended to replace advice given to you by your health care provider. Make sure you discuss any questions you have with your health care provider. Document Released: 11/02/2015 Document Revised: 02/03/2016 Document Reviewed: 11/02/2015 Elsevier Interactive Patient Education  [employer].

## 2024-03-23 ENCOUNTER — Other Ambulatory Visit: Payer: Self-pay | Admitting: Family

## 2024-03-24 ENCOUNTER — Encounter
Admission: RE | Admit: 2024-03-24 | Discharge: 2024-03-24 | Disposition: A | Discharge status: Home / Self Care | Source: Ambulatory Visit | Attending: Surgery | Admitting: Surgery

## 2024-03-24 ENCOUNTER — Other Ambulatory Visit: Payer: Self-pay

## 2024-03-24 DIAGNOSIS — E1165 Type 2 diabetes mellitus with hyperglycemia: Secondary | ICD-10-CM

## 2024-03-24 DIAGNOSIS — K429 Umbilical hernia without obstruction or gangrene: Secondary | ICD-10-CM

## 2024-03-24 HISTORY — DX: Malignant (primary) neoplasm, unspecified: C80.1

## 2024-03-24 HISTORY — DX: Umbilical hernia without obstruction or gangrene: K42.9

## 2024-03-24 HISTORY — DX: Personal history of urinary calculi: Z87.442

## 2024-03-24 NOTE — Patient Instructions (Addendum)
 Your procedure is scheduled on: 03/30/24 - Wednesday Report to the Registration Desk on the 1st floor of the [facility]. To find out your arrival time, please call [phone] between 1PM - 3PM on: 03/24/24 - Tuesday If your arrival time is 6:00 am, do not arrive before that time as the [facility] entrance doors do not open until 6:00 am.  REMEMBER: Instructions that are not followed completely may result in serious medical risk, up to and including death; or upon the discretion of your surgeon and anesthesiologist your surgery may need to be rescheduled.  Do not eat food or drink any liquids after midnight the night before surgery.  No gum chewing or hard candies.  One week prior to surgery: Stop Anti-inflammatories (NSAIDS) such as Advil, Aleve, Ibuprofen, Motrin, Naproxen, Naprosyn and Aspirin based products such as Excedrin, Goody's Powder, BC Powder. You may take Tylenol  if needed for pain up until the day of surgery.  Stop ANY OVER THE COUNTER supplements until after surgery.  MOUNJARO  hold 7 days prior to your surgery.  ON THE DAY OF SURGERY ONLY TAKE THESE MEDICATIONS WITH SIPS OF WATER:  amLODipine  (NORVASC )  celecoxib  (CELEBREX )  metoprolol  tartrate (LOPRESSOR )    No Alcohol for 24 hours before or after surgery.  No Smoking including e-cigarettes for 24 hours before surgery.  No chewable tobacco products for at least 6 hours before surgery.  No nicotine patches on the day of surgery.  Do not use any recreational drugs for at least a week (preferably 2 weeks) before your surgery.  Please be advised that the combination of cocaine and anesthesia may have negative outcomes, up to and including death. If you test positive for cocaine, your surgery will be cancelled.  On the morning of surgery brush your teeth with toothpaste and water, you may rinse your mouth with mouthwash if you wish. Do not swallow any toothpaste or mouthwash.  Use CHG Soap or wipes as  directed on instruction sheet.  Do not wear jewelry, make-up, hairpins, clips or nail polish.  For welded (permanent) jewelry: bracelets, anklets, waist bands, etc.  Please have this removed prior to surgery.  If it is not removed, there is a chance that hospital personnel will need to cut it off on the day of surgery.  Do not wear lotions, powders, or perfumes.   Do not shave body hair from the neck down 48 hours before surgery.  Contact lenses, hearing aids and dentures may not be worn into surgery.  Do not bring valuables to the hospital. [facility] is not responsible for any missing/lost belongings or valuables.   Notify your doctor if there is any change in your medical condition (cold, fever, infection).  Wear comfortable clothing (specific to your surgery type) to the hospital.  After surgery, you can help prevent lung complications by doing breathing exercises.  Take deep breaths and cough every 1-2 hours. Your doctor may order a device called an Incentive Spirometer to help you take deep breaths.  When coughing or sneezing, hold a pillow firmly against your incision with both hands. This is called "splinting." Doing this helps protect your incision. It also decreases belly discomfort.  If you are being admitted to the hospital overnight, leave your suitcase in the car. After surgery it may be brought to your room.  In case of increased patient census, it may be necessary for you, the patient, to continue your postoperative care in the Same Day Surgery department.  If you are  being discharged the day of surgery, you will not be allowed to drive home. You will need a responsible individual to drive you home and stay with you for 24 hours after surgery.   If you are taking public transportation, you will need to have a responsible individual with you.  Please call the Pre-admissions Testing Dept. at [phone] if you have any questions about these instructions.  Surgery  Visitation Policy:  Patients having surgery or a procedure may have two visitors.  Children under the age of 86 must have an adult with them who is not the patient.  Inpatient Visitation:    Visiting hours are 7 a.m. to 8 p.m. Up to four visitors are allowed at one time in a patient room. The visitors may rotate out with other people during the day.  One visitor age 50 or older may stay with the patient overnight and must be in the room by 8 p.m.   Merchandiser, retail to address health-related social needs:  [email or web address]                                                                                                             Preparing for Surgery with CHLORHEXIDINE GLUCONATE (CHG) Soap  Chlorhexidine Gluconate (CHG) Soap  o An antiseptic cleaner that kills germs and bonds with the skin to continue killing germs even after washing  o Used for showering the night before surgery and morning of surgery  Before surgery, you can play an important role by reducing the number of germs on your skin.  CHG (Chlorhexidine gluconate) soap is an antiseptic cleanser which kills germs and bonds with the skin to continue killing germs even after washing.  Please do not use if you have an allergy to CHG or antibacterial soaps. If your skin becomes reddened/irritated stop using the CHG.  1. Shower the NIGHT BEFORE SURGERY with CHG soap.  2. If you choose to wash your hair, wash your hair first as usual with your normal shampoo.  3. After shampooing, rinse your hair and body thoroughly to remove the shampoo.  4. Use CHG as you would any other liquid soap. You can apply CHG directly to the skin and wash gently with a clean washcloth.  5. Apply the CHG soap to your body only from the neck down. Do not use on open wounds or open sores. Avoid contact with your eyes, ears, mouth, and genitals (private parts). Wash face and genitals (private parts) with your normal  soap.  6. Wash thoroughly, paying special attention to the area where your surgery will be performed.  7. Thoroughly rinse your body with warm water.  8. Do not shower/wash with your normal soap after using and rinsing off the CHG soap.  9. Do not use lotions, oils, etc., after showering with CHG.  10. Pat yourself dry with a clean towel.  11. Wear clean pajamas to bed the night before surgery.  12. Place clean sheets on your bed the night of your shower and do not sleep  with pets.  13. Do not apply any deodorants/lotions/powders.  14. Please wear clean clothes to the hospital.  15. Remember to brush your teeth with your regular toothpaste.

## 2024-03-25 ENCOUNTER — Encounter
Admission: RE | Admit: 2024-03-25 | Discharge: 2024-03-25 | Disposition: A | Discharge status: Home / Self Care | Source: Ambulatory Visit | Attending: Surgery | Admitting: Surgery

## 2024-03-25 DIAGNOSIS — K436 Other and unspecified ventral hernia with obstruction, without gangrene: Secondary | ICD-10-CM | POA: Diagnosis not present

## 2024-03-25 DIAGNOSIS — Z01818 Encounter for other preprocedural examination: Secondary | ICD-10-CM | POA: Insufficient documentation

## 2024-03-25 DIAGNOSIS — Z01810 Encounter for preprocedural cardiovascular examination: Secondary | ICD-10-CM | POA: Diagnosis not present

## 2024-03-25 LAB — CBC WITH DIFFERENTIAL/PLATELET
Abs Immature Granulocytes: 0.03 K/uL (ref 0.00–0.07)
Basophils Absolute: 0 K/uL (ref 0.0–0.1)
Basophils Relative: 0 %
Eosinophils Absolute: 0.1 K/uL (ref 0.0–0.5)
Eosinophils Relative: 2 %
HCT: 44.6 % (ref 39.0–52.0)
Hemoglobin: 15.3 g/dL (ref 13.0–17.0)
Immature Granulocytes: 0 %
Lymphocytes Relative: 17 %
Lymphs Abs: 1.2 K/uL (ref 0.7–4.0)
MCH: 30.1 pg (ref 26.0–34.0)
MCHC: 34.3 g/dL (ref 30.0–36.0)
MCV: 87.6 fL (ref 80.0–100.0)
Monocytes Absolute: 1.2 K/uL — ABNORMAL HIGH (ref 0.1–1.0)
Monocytes Relative: 16 %
Neutro Abs: 4.6 K/uL (ref 1.7–7.7)
Neutrophils Relative %: 65 %
Platelets: 103 K/uL — ABNORMAL LOW (ref 150–400)
RBC: 5.09 MIL/uL (ref 4.22–5.81)
RDW: 13.4 % (ref 11.5–15.5)
WBC: 7.2 K/uL (ref 4.0–10.5)
nRBC: 0 % (ref 0.0–0.2)

## 2024-03-25 LAB — COMPREHENSIVE METABOLIC PANEL WITH GFR
ALT: 21 U/L (ref 0–44)
AST: 13 U/L — ABNORMAL LOW (ref 15–41)
Albumin: 3.9 g/dL (ref 3.5–5.0)
Alkaline Phosphatase: 21 U/L — ABNORMAL LOW (ref 38–126)
Anion gap: 9 (ref 5–15)
BUN: 24 mg/dL — ABNORMAL HIGH (ref 8–23)
CO2: 27 mmol/L (ref 22–32)
Calcium: 9.2 mg/dL (ref 8.9–10.3)
Chloride: 103 mmol/L (ref 98–111)
Creatinine, Ser: 1.05 mg/dL (ref 0.61–1.24)
GFR, Estimated: >60 mL/min (ref >60)
Glucose, Bld: 89 mg/dL (ref 70–99)
Potassium: 4.5 mmol/L (ref 3.5–5.1)
Sodium: 139 mmol/L (ref 135–145)
Total Bilirubin: 0.8 mg/dL (ref 0.0–1.2)
Total Protein: 6.5 g/dL (ref 6.5–8.1)

## 2024-03-30 ENCOUNTER — Ambulatory Visit

## 2024-03-30 ENCOUNTER — Encounter
Admission: RE | Disposition: A | Discharge status: Home / Self Care | Payer: Self-pay | Source: Home / Self Care | Attending: Surgery

## 2024-03-30 ENCOUNTER — Ambulatory Visit
Admission: RE | Admit: 2024-03-30 | Discharge: 2024-03-30 | Disposition: A | Discharge status: Home / Self Care | Attending: Surgery | Admitting: Surgery

## 2024-03-30 ENCOUNTER — Other Ambulatory Visit: Payer: Self-pay

## 2024-03-30 ENCOUNTER — Encounter: Payer: Self-pay | Admitting: Surgery

## 2024-03-30 ENCOUNTER — Ambulatory Visit: Payer: Self-pay | Admitting: Urgent Care

## 2024-03-30 DIAGNOSIS — K436 Other and unspecified ventral hernia with obstruction, without gangrene: Secondary | ICD-10-CM

## 2024-03-30 DIAGNOSIS — I10 Essential (primary) hypertension: Secondary | ICD-10-CM | POA: Diagnosis not present

## 2024-03-30 DIAGNOSIS — K420 Umbilical hernia with obstruction, without gangrene: Secondary | ICD-10-CM | POA: Diagnosis not present

## 2024-03-30 DIAGNOSIS — Z7985 Long-term (current) use of injectable non-insulin antidiabetic drugs: Secondary | ICD-10-CM | POA: Insufficient documentation

## 2024-03-30 DIAGNOSIS — M6208 Separation of muscle (nontraumatic), other site: Secondary | ICD-10-CM

## 2024-03-30 DIAGNOSIS — E1165 Type 2 diabetes mellitus with hyperglycemia: Secondary | ICD-10-CM

## 2024-03-30 DIAGNOSIS — E1140 Type 2 diabetes mellitus with diabetic neuropathy, unspecified: Secondary | ICD-10-CM | POA: Insufficient documentation

## 2024-03-30 HISTORY — PX: INSERTION OF MESH: SHX5868

## 2024-03-30 HISTORY — PX: XI ROBOTIC ASSISTED VENTRAL HERNIA: SHX6789

## 2024-03-30 LAB — GLUCOSE, CAPILLARY
Glucose-Capillary: 83 mg/dL (ref 70–99)
Glucose-Capillary: 113 mg/dL — ABNORMAL HIGH (ref 70–99)

## 2024-03-30 SURGERY — REPAIR, HERNIA, VENTRAL, ROBOT-ASSISTED
Anesthesia: General

## 2024-03-30 MED ORDER — GABAPENTIN 300 MG PO CAPS
ORAL_CAPSULE | ORAL | Status: AC
  Filled 2024-03-30: qty 1

## 2024-03-30 MED ORDER — GLYCOPYRROLATE 0.2 MG/ML IJ SOLN
INTRAMUSCULAR | Status: DC | PRN
  Administered 2024-03-30: .2 mg via INTRAVENOUS

## 2024-03-30 MED ORDER — GABAPENTIN 300 MG PO CAPS
300.0000 mg | ORAL_CAPSULE | ORAL | Status: AC
  Administered 2024-03-30: 300 mg via ORAL

## 2024-03-30 MED ORDER — PROPOFOL 1000 MG/100ML IV EMUL
INTRAVENOUS | Status: AC
  Filled 2024-03-30: qty 100

## 2024-03-30 MED ORDER — PROPOFOL 10 MG/ML IV BOLUS
INTRAVENOUS | Status: DC | PRN
  Administered 2024-03-30: 50 mg via INTRAVENOUS
  Administered 2024-03-30: 150 mg via INTRAVENOUS

## 2024-03-30 MED ORDER — OXYCODONE HCL 5 MG/5ML PO SOLN: 5.0000 mg | Freq: Once | ORAL | Status: AC | PRN

## 2024-03-30 MED ORDER — FENTANYL CITRATE (PF) 100 MCG/2ML IJ SOLN
25.0000 ug | INTRAMUSCULAR | Status: DC | PRN
  Administered 2024-03-30 (×2): 25 ug via INTRAVENOUS

## 2024-03-30 MED ORDER — FENTANYL CITRATE (PF) 100 MCG/2ML IJ SOLN
INTRAMUSCULAR | Status: AC
  Filled 2024-03-30: qty 2

## 2024-03-30 MED ORDER — ROCURONIUM BROMIDE 10 MG/ML (PF) SYRINGE
PREFILLED_SYRINGE | INTRAVENOUS | Status: AC
  Filled 2024-03-30: qty 10

## 2024-03-30 MED ORDER — ACETAMINOPHEN 500 MG PO TABS
ORAL_TABLET | ORAL | Status: AC
  Filled 2024-03-30: qty 2

## 2024-03-30 MED ORDER — LIDOCAINE HCL (CARDIAC) PF 100 MG/5ML IV SOSY
PREFILLED_SYRINGE | INTRAVENOUS | Status: DC | PRN
  Administered 2024-03-30: 100 mg via INTRAVENOUS

## 2024-03-30 MED ORDER — CHLORHEXIDINE GLUCONATE CLOTH 2 % EX PADS: 6.0000 | MEDICATED_PAD | Freq: Once | CUTANEOUS | Status: DC

## 2024-03-30 MED ORDER — SODIUM CHLORIDE 0.9 % IV SOLN: INTRAVENOUS | Status: DC

## 2024-03-30 MED ORDER — ACETAMINOPHEN 500 MG PO TABS
1000.0000 mg | ORAL_TABLET | ORAL | Status: AC
  Administered 2024-03-30: 1000 mg via ORAL

## 2024-03-30 MED ORDER — CEFAZOLIN SODIUM-DEXTROSE 2-4 GM/100ML-% IV SOLN
INTRAVENOUS | Status: AC
  Filled 2024-03-30: qty 100

## 2024-03-30 MED ORDER — BUPIVACAINE LIPOSOME 1.3 % IJ SUSP: 20.0000 mL | Freq: Once | INTRAMUSCULAR | Status: DC

## 2024-03-30 MED ORDER — PROPOFOL 500 MG/50ML IV EMUL
INTRAVENOUS | Status: DC | PRN
  Administered 2024-03-30: 125 ug/kg/min via INTRAVENOUS

## 2024-03-30 MED ORDER — SUGAMMADEX SODIUM 200 MG/2ML IV SOLN
INTRAVENOUS | Status: DC | PRN
  Administered 2024-03-30: 200 mg via INTRAVENOUS
  Administered 2024-03-30: 100 mg via INTRAVENOUS

## 2024-03-30 MED ORDER — BUPIVACAINE-EPINEPHRINE 0.25% -1:200000 IJ SOLN
INTRAMUSCULAR | Status: DC | PRN
  Administered 2024-03-30: 30 mL

## 2024-03-30 MED ORDER — ROCURONIUM BROMIDE 100 MG/10ML IV SOLN
INTRAVENOUS | Status: DC | PRN
  Administered 2024-03-30: 60 mg via INTRAVENOUS
  Administered 2024-03-30: 20 mg via INTRAVENOUS

## 2024-03-30 MED ORDER — CHLORHEXIDINE GLUCONATE 0.12 % MT SOLN
15.0000 mL | Freq: Once | OROMUCOSAL | Status: AC
  Administered 2024-03-30: 15 mL via OROMUCOSAL

## 2024-03-30 MED ORDER — HYDROMORPHONE HCL 1 MG/ML IJ SOLN
INTRAMUSCULAR | Status: DC | PRN
  Administered 2024-03-30: 1 mg via INTRAVENOUS

## 2024-03-30 MED ORDER — HYDROMORPHONE HCL 1 MG/ML IJ SOLN
INTRAMUSCULAR | Status: AC
  Filled 2024-03-30: qty 1

## 2024-03-30 MED ORDER — PROPOFOL 10 MG/ML IV BOLUS
INTRAVENOUS | Status: AC
Start: 2024-03-30 — End: 2024-03-30
  Filled 2024-03-30: qty 20

## 2024-03-30 MED ORDER — OXYCODONE HCL 5 MG PO TABS
5.0000 mg | ORAL_TABLET | Freq: Once | ORAL | Status: AC | PRN
  Administered 2024-03-30: 5 mg via ORAL

## 2024-03-30 MED ORDER — HYDROCODONE-ACETAMINOPHEN 5-325 MG PO TABS: 1.0000 | ORAL_TABLET | Freq: Four times a day (QID) | ORAL | 0 refills | Status: DC | PRN

## 2024-03-30 MED ORDER — FENTANYL CITRATE (PF) 100 MCG/2ML IJ SOLN
INTRAMUSCULAR | Status: DC | PRN
  Administered 2024-03-30 (×2): 50 ug via INTRAVENOUS

## 2024-03-30 MED ORDER — ONDANSETRON HCL 4 MG/2ML IJ SOLN
INTRAMUSCULAR | Status: DC | PRN
Start: 2024-03-30 — End: 2024-03-30
  Administered 2024-03-30: 4 mg via INTRAVENOUS

## 2024-03-30 MED ORDER — LABETALOL HCL 5 MG/ML IV SOLN
INTRAVENOUS | Status: DC | PRN
  Administered 2024-03-30: 10 mg via INTRAVENOUS
  Administered 2024-03-30: 5 mg via INTRAVENOUS

## 2024-03-30 MED ORDER — BUPIVACAINE-EPINEPHRINE (PF) 0.25% -1:200000 IJ SOLN
INTRAMUSCULAR | Status: AC
  Filled 2024-03-30: qty 30

## 2024-03-30 MED ORDER — LACTATED RINGERS IV SOLN: INTRAVENOUS | Status: DC | PRN

## 2024-03-30 MED ORDER — CEFAZOLIN SODIUM-DEXTROSE 2-4 GM/100ML-% IV SOLN
2.0000 g | INTRAVENOUS | Status: AC
  Administered 2024-03-30: 2 g via INTRAVENOUS

## 2024-03-30 MED ORDER — OXYCODONE HCL 5 MG PO TABS
ORAL_TABLET | ORAL | Status: AC
  Filled 2024-03-30: qty 1

## 2024-03-30 MED ORDER — ORAL CARE MOUTH RINSE: 15.0000 mL | Freq: Once | OROMUCOSAL | Status: AC

## 2024-03-30 MED ORDER — LIDOCAINE HCL (PF) 2 % IJ SOLN
INTRAMUSCULAR | Status: AC
  Filled 2024-03-30: qty 5

## 2024-03-30 MED ORDER — IBUPROFEN 800 MG PO TABS: 800.0000 mg | ORAL_TABLET | Freq: Three times a day (TID) | ORAL | 0 refills | Status: AC | PRN

## 2024-03-30 MED ORDER — CHLORHEXIDINE GLUCONATE 0.12 % MT SOLN
OROMUCOSAL | Status: AC
Start: 2024-03-30 — End: 2024-03-30
  Filled 2024-03-30: qty 15

## 2024-03-30 SURGICAL SUPPLY — 42 items
BINDER ABD UNIV 12 45-62 (WOUND CARE) IMPLANT
CHLORAPREP W/TINT 26 (MISCELLANEOUS) IMPLANT
COVER TIP SHEARS 8 DVNC (MISCELLANEOUS) ×2 IMPLANT
COVER WAND RF STERILE (DRAPES) ×2 IMPLANT
DEFOGGER SCOPE WARM SEASHARP (MISCELLANEOUS) ×2 IMPLANT
DERMABOND ADVANCED .7 DNX12 (GAUZE/BANDAGES/DRESSINGS) ×2 IMPLANT
DRAPE ARM DVNC X/XI (DISPOSABLE) ×6 IMPLANT
DRAPE COLUMN DVNC XI (DISPOSABLE) ×2 IMPLANT
ELECTRODE REM PT RTRN 9FT ADLT (ELECTROSURGICAL) ×2 IMPLANT
FORCEPS BPLR R/ABLATION 8 DVNC (INSTRUMENTS) ×2 IMPLANT
GLOVE ORTHO TXT STRL SZ7.5 (GLOVE) ×4 IMPLANT
GOWN STRL REUS W/ TWL LRG LVL3 (GOWN DISPOSABLE) ×2 IMPLANT
GOWN STRL REUS W/ TWL XL LVL3 (GOWN DISPOSABLE) ×4 IMPLANT
GRASPER SUT TROCAR 14GX15 (MISCELLANEOUS) IMPLANT
IRRIGATION STRYKERFLOW (MISCELLANEOUS) IMPLANT
KIT PINK PAD W/HEAD ARM REST (MISCELLANEOUS) ×2 IMPLANT
KIT TURNOVER KIT A (KITS) ×2 IMPLANT
MANIFOLD NEPTUNE II (INSTRUMENTS) ×2 IMPLANT
MESH OVITEX CORE PERM 12X12 4L (Mesh General) IMPLANT
NDL DRIVE SUT CUT DVNC (INSTRUMENTS) ×2 IMPLANT
NDL HYPO 22X1.5 SAFETY MO (MISCELLANEOUS) ×2 IMPLANT
NDL INSUFFLATION 14GA 120MM (NEEDLE) ×2 IMPLANT
NEEDLE DRIVE SUT CUT DVNC (INSTRUMENTS) ×2 IMPLANT
NEEDLE HYPO 22X1.5 SAFETY MO (MISCELLANEOUS) ×2 IMPLANT
NEEDLE INSUFFLATION 14GA 120MM (NEEDLE) ×2 IMPLANT
NS IRRIG 500ML POUR BTL (IV SOLUTION) ×2 IMPLANT
PACK LAP CHOLECYSTECTOMY (MISCELLANEOUS) ×2 IMPLANT
SCISSORS LAP 5X35 DISP (ENDOMECHANICALS) IMPLANT
SCISSORS MNPLR CVD DVNC XI (INSTRUMENTS) ×2 IMPLANT
SEAL UNIV 5-12 XI (MISCELLANEOUS) ×6 IMPLANT
SET TUBE SMOKE EVAC HIGH FLOW (TUBING) ×2 IMPLANT
SOL .9 NS 3000ML IRR UROMATIC (IV SOLUTION) IMPLANT
SOLUTION ELECTROSURG ANTI STCK (MISCELLANEOUS) ×2 IMPLANT
SPIKE FLUID TRANSFER (MISCELLANEOUS) ×2 IMPLANT
SUT STRATA 2-0 23CM CT-2 (SUTURE) ×4 IMPLANT
SUT STRATAFIX SPIRAL PDS+ 0 30 (SUTURE) ×2 IMPLANT
SUT VIC AB 2-0 SH 27XBRD (SUTURE) IMPLANT
SUT VICRYL 0 UR6 27IN ABS (SUTURE) ×2 IMPLANT
SUTURE MNCRL 4-0 27XMF (SUTURE) ×2 IMPLANT
TRAP FLUID SMOKE EVACUATOR (MISCELLANEOUS) ×2 IMPLANT
TROCAR Z-THREAD FIOS 11X100 BL (TROCAR) ×2 IMPLANT
WATER STERILE IRR 500ML POUR (IV SOLUTION) ×2 IMPLANT

## 2024-03-30 NOTE — Transfer of Care (Signed)
 Immediate Anesthesia Transfer of Care Note  Patient: Isaiah Walker  Procedure(s) Performed: REPAIR, HERNIA, VENTRAL, ROBOT-ASSISTED INSERTION OF MESH  Patient Location: PACU  Anesthesia Type:General  Level of Consciousness: drowsy  Airway & Oxygen Therapy: Patient Spontanous Breathing and Patient connected to face mask oxygen  Post-op Assessment: Report given to RN and Post -op Vital signs reviewed and stable  Post vital signs: stable  Last Vitals:  Vitals Value Taken Time  BP 134/69 03/30/24 11:15  Temp 36.3 C 03/30/24 11:10  Pulse 80 03/30/24 11:16  Resp 5 03/30/24 11:16  SpO2 97 % 03/30/24 11:16  Vitals shown include unfiled device data.  Last Pain:  Vitals:   03/30/24 1110  TempSrc:   PainSc: Asleep         Complications: No notable events documented.

## 2024-03-30 NOTE — Anesthesia Procedure Notes (Signed)
 Procedure Name: Intubation Date/Time: 03/30/2024 9:13 AM  Performed by: Brien Sotero PARAS, CRNAPre-anesthesia Checklist: Patient identified, Patient being monitored, Timeout performed, Emergency Drugs available and Suction available Patient Re-evaluated:Patient Re-evaluated prior to induction Oxygen Delivery Method: Circle system utilized Preoxygenation: Pre-oxygenation with 100% oxygen Induction Type: IV induction Ventilation: Mask ventilation without difficulty Laryngoscope Size: McGrath and 4 Grade View: Grade I Tube type: Oral Tube size: 7.5 mm Number of attempts: 1 Airway Equipment and Method: Stylet Placement Confirmation: ETT inserted through vocal cords under direct vision, positive ETCO2 and breath sounds checked- equal and bilateral Secured at: 22 cm Tube secured with: Tape Dental Injury: Teeth and Oropharynx as per pre-operative assessment

## 2024-03-30 NOTE — Anesthesia Preprocedure Evaluation (Signed)
 Anesthesia Evaluation  Patient identified by MRN, date of birth, ID band Patient awake    Reviewed: Allergy & Precautions, NPO status , Patient's Chart, lab work & pertinent test results  History of Anesthesia Complications Negative for: history of anesthetic complications  Airway Mallampati: II  TM Distance: >3 FB Neck ROM: Full    Dental  (+) Poor Dentition   Pulmonary neg pulmonary ROS, neg sleep apnea, neg COPD   breath sounds clear to auscultation- rhonchi (-) wheezing      Cardiovascular hypertension, Pt. on medications (-) CAD, (-) Past MI, (-) Cardiac Stents and (-) CABG  Rhythm:Regular Rate:Normal - Systolic murmurs and - Diastolic murmurs    Neuro/Psych neg Seizures PSYCHIATRIC DISORDERS  Depression    negative neurological ROS     GI/Hepatic negative GI ROS, Neg liver ROS,,,  Endo/Other  negative endocrine ROSdiabetes    Renal/GU Renal disease     Musculoskeletal   Abdominal   Peds  Hematology negative hematology ROS (+)   Anesthesia Other Findings Past Medical History: No date: Arthritis No date: BPH (benign prostatic hyperplasia) No date: Crohn's disease (HCC) No date: Hypertension No date: Kidney stones   Reproductive/Obstetrics                              Anesthesia Physical Anesthesia Plan  ASA: 2  Anesthesia Plan: General ETT   Post-op Pain Management:    Induction: Intravenous  PONV Risk Score and Plan: 3 and Ondansetron , Dexamethasone  and Midazolam   Airway Management Planned: Oral ETT  Additional Equipment:   Intra-op Plan:   Post-operative Plan: Extubation in OR  Informed Consent: I have reviewed the patients History and Physical, chart, labs and discussed the procedure including the risks, benefits and alternatives for the proposed anesthesia with the patient or authorized representative who has indicated his/her understanding and acceptance.      Dental Advisory Given  Plan Discussed with: Anesthesiologist, CRNA and Surgeon  Anesthesia Plan Comments: (Patient consented for risks of anesthesia including but not limited to:  - adverse reactions to medications - damage to eyes, teeth, lips or other oral mucosa - nerve damage due to positioning  - sore throat or hoarseness - Damage to heart, brain, nerves, lungs, other parts of body or loss of life  Patient voiced understanding and assent.)        Anesthesia Quick Evaluation

## 2024-03-30 NOTE — Op Note (Signed)
 Robotic assisted laparoscopic anterior abdominal hernia repair, IPOM , initial , incarcerated final measured fascial defect 2.4 cm.    Pre-operative Diagnosis: Anterior abdominal hernia, preop estimated fascial defect size  3 cm.   Post-operative Diagnosis: same, less than 3 cm.    Surgeon:  Honor Leghorn, M.D., FACS   Anesthesia: Gen. with endotracheal tube   Findings:  2.4  CM fascial defect.  Estimated Blood Loss: 15 mL   Complications: none           Procedure Details  The patient was seen again in the Holding Room. The benefits, complications, treatment options, and expected outcomes were discussed with the patient. The risks of bleeding, infection, recurrence of symptoms, failure to resolve symptoms, bowel injury, mesh placement, mesh infection, any of which could require further surgery were reviewed with the patient. The likelihood of improving the patient's symptoms with return to their baseline status is good.  The patient and/or family concurred with the proposed plan, giving informed consent.  The patient was taken to Operating Room, identified and the procedure verified.  A Time Out was held and the above information confirmed.   Prior to the induction of general anesthesia, antibiotic prophylaxis was administered. VTE prophylaxis was in place. General endotracheal anesthesia was then administered and tolerated well. After the induction, the abdomen was prepped with Chloraprep and draped in the sterile fashion. The patient was positioned in the supine position.  After local infiltration of quarter percent Marcaine with epinephrine, stab incision was made left upper quadrant.  Just below the costal margin approximately midclavicular line the Veress needle is passed with sensation of the layers to penetrate the abdominal wall and into the peritoneum.  Saline drop test is confirmed peritoneal placement.  Insufflation is initiated with carbon dioxide to pressures of 15 mmHg. Marcaine  quarter percent  was used to inject all the incision sites.  The insufflation was decreased to maintain a 6 mmHg pressure throughout the remainder of the procedure.  We used a left upper quadrant subcostal incision and using an [employer] optical FIOS 11 mm trocar, it was inserted with direct visualization and pneumoperitoneum maintained.  No hemodynamic compromise, no evidence of intraperitoneal injury.   2 additional 8 mm ports were placed under direct visualization on the left lateral abdomen.  I visualized the hernia and there was omental contents within a supra-umbilical hernia.    The robot was brought to the surgical field and docked in the standard fashion.  We made sure that all instrumentation was kept under direct vision at all times and there was no collision between the arms.  I scrubbed out and went to the console. Falciform and adjacent preperitoneal adipose and umbilical ligaments and hernia sac were taken down with electrocautery to allow adequate mesh placement to be secured to the fascial tissue. Confirm and measured that the defect was 2.4 cm.  .  Using a 0 Stratafix suture we closed the ventral defect primarily.  At this time I went ahead and inserted the OviTex LPR 12 cm diameter circle mesh.  I used the same suture to secure the mesh centering it to the fascial closure.    The mesh was secured circumferentially to the abdominal wall using 2-0 V-lock in the standard fashion.   The mesh appeared well secured against the  abdominal wall. A second look laparoscopy revealed no evidence of intra-abdominal injury.    All the needles were removed under direct visualization.  The instruments were removed and the robot  was undocked.    The fascial sutures approximated in the standard fashion,  utilizing the PMI under direct visualization. The laparoscopic ports were removed under direct visualization and the pneumoperitoneum was deflated. Incisions were closed with  4-0 Monocryl   Dermabond  was used to coat the skin.  Patient tolerated procedure well and there were no immediate complications. Needle and laparotomy counts were correct   Abdominal binder to be applied in PACU.  Honor Leghorn M.D., [facility] 03/30/2024 11:28 AM

## 2024-03-30 NOTE — Interval H&P Note (Signed)
 History and Physical Interval Note:  03/30/2024 8:44 AM  Isaiah Walker  has presented today for surgery, with the diagnosis of Incarcerated ventral hernia.  The various methods of treatment have been discussed with the patient and family. After consideration of risks, benefits and other options for treatment, the patient has consented to  Procedure(s): REPAIR, HERNIA, VENTRAL, ROBOT-ASSISTED (N/A) as a surgical intervention.  The patient's history has been reviewed, patient examined, no change in status, stable for surgery.  I have reviewed the patient's chart and labs.  Questions were answered to the patient's satisfaction.     Honor Leghorn

## 2024-03-31 ENCOUNTER — Encounter: Payer: Self-pay | Admitting: Surgery

## 2024-03-31 NOTE — Anesthesia Postprocedure Evaluation (Signed)
 Anesthesia Post Note  Patient: Isaiah Walker  Procedure(s) Performed: REPAIR, HERNIA, VENTRAL, ROBOT-ASSISTED INSERTION OF MESH  Patient location during evaluation: PACU Anesthesia Type: General Level of consciousness: awake and alert Pain management: pain level controlled Vital Signs Assessment: post-procedure vital signs reviewed and stable Respiratory status: spontaneous breathing, nonlabored ventilation, respiratory function stable and patient connected to nasal cannula oxygen Cardiovascular status: blood pressure returned to baseline and stable Postop Assessment: no apparent nausea or vomiting Anesthetic complications: no   No notable events documented.   Last Vitals:  Vitals:   03/30/24 1211 03/30/24 1232  BP: 136/84 (!) 168/86  Pulse: 74 66  Resp: 11 16  Temp: (!) 36.3 C 36.4 C  SpO2: 96% 96%    Last Pain:  Vitals:   03/30/24 1232  TempSrc: Temporal  PainSc: 4                  Debby Mines

## 2024-04-01 ENCOUNTER — Encounter: Payer: Self-pay | Admitting: Surgery

## 2024-04-13 ENCOUNTER — Encounter: Payer: Self-pay | Admitting: Physician Assistant

## 2024-04-13 ENCOUNTER — Ambulatory Visit (INDEPENDENT_AMBULATORY_CARE_PROVIDER_SITE_OTHER): Admitting: Physician Assistant

## 2024-04-13 VITALS — BP 108/68 | HR 71 | Ht 66.0 in | Wt 231.0 lb

## 2024-04-13 DIAGNOSIS — Z09 Encounter for follow-up examination after completed treatment for conditions other than malignant neoplasm: Secondary | ICD-10-CM

## 2024-04-13 DIAGNOSIS — K429 Umbilical hernia without obstruction or gangrene: Secondary | ICD-10-CM

## 2024-04-13 DIAGNOSIS — K436 Other and unspecified ventral hernia with obstruction, without gangrene: Secondary | ICD-10-CM

## 2024-04-13 NOTE — Patient Instructions (Signed)

## 2024-04-13 NOTE — Progress Notes (Signed)
 [facility] POST-OP OFFICE VISIT  04/13/2024  HPI: Isaiah Walker is a 78 y.o. male 14 days s/p robotic assisted laparoscopic anterior abdominal wall hernia (2.4 cm) repair with Dr Lane   He has done remarkably well Sore for about 2 days; now pain free No fever, chills, nausea, emesis Bowel movements are at his baseline He is utilizing abdominal binder as instructed No other complaints   Vital signs: BP 108/68   Pulse 71   Ht 5' 6 (1.676 m)   Wt 231 lb (104.8 kg)   SpO2 98%   BMI 37.28 kg/m    Physical Exam: Constitutional: Well appearing male, NAD Abdomen: Soft, non-tender, non-distended, no rebound/guarding Skin: Laparoscopic incisions are healing well, no erythema or drainage   Assessment/Plan: This is a 78 y.o. male 14 days s/p robotic assisted laparoscopic anterior abdominal wall hernia (2.4 cm) repair with Dr Lane    - Pain control prn  - Reviewed wound care recommendation  - Reviewed lifting restrictions; 6 weeks total minimum  - Continue abdominal binder strictly for another 2 weeks; Okay to wean after this  - He can follow up on as needed basis; He understands to call with questions/concerns  -- Arthea Platt, PA-C [facility] 04/13/2024, 3:11 PM M-F: 7am - 4pm

## 2024-04-30 ENCOUNTER — Other Ambulatory Visit: Payer: Self-pay | Admitting: Family

## 2024-04-30 DIAGNOSIS — I10 Essential (primary) hypertension: Secondary | ICD-10-CM

## 2024-05-03 ENCOUNTER — Ambulatory Visit: Admitting: Cardiology

## 2024-05-03 ENCOUNTER — Encounter: Payer: Self-pay | Admitting: Cardiology

## 2024-05-03 VITALS — BP 124/68 | HR 75 | Ht 66.0 in | Wt 271.6 lb

## 2024-05-03 DIAGNOSIS — B370 Candidal stomatitis: Secondary | ICD-10-CM | POA: Insufficient documentation

## 2024-05-03 DIAGNOSIS — I10 Essential (primary) hypertension: Secondary | ICD-10-CM

## 2024-05-03 DIAGNOSIS — E1165 Type 2 diabetes mellitus with hyperglycemia: Secondary | ICD-10-CM

## 2024-05-03 DIAGNOSIS — E66813 Obesity, class 3: Secondary | ICD-10-CM

## 2024-05-03 MED ORDER — CLOTRIMAZOLE 10 MG MT TROC: 10.0000 mg | Freq: Every day | OROMUCOSAL | 0 refills | Status: AC

## 2024-05-03 NOTE — Progress Notes (Signed)
 Established Patient Office Visit  Subjective:  Patient ID: Isaiah Walker, male    DOB: 1945-09-13  Age: 78 y.o. MRN: [id number]  Chief Complaint  Patient presents with   Acute Visit    Possible thrush    Patient in office for an acute visit, thinks he may have thrush. Patient unsure when he first noticed it. Patient had hernia repair on 03/30/24, was on an antibiotic at that time.  States it is a thick, white substance. In the morning, on the roof of his mouth, brushes it off, returns the next day. States he feels it in his throat. On exam, white substance noted on tonsils, in throat. Will send in clotrimazole.  Blood pressure well controlled today. Normal kidney function in October 2025.    No other concerns at this time.   Past Medical History:  Diagnosis Date   Arthritis    BPH (benign prostatic hyperplasia)    Cancer (HCC)    basal cell   Crohn's disease (HCC)    Depression    Diabetes mellitus without complication (HCC)    History of kidney stones    Hypertension    Kidney stones    Partial small bowel obstruction (HCC) 09/13/2020   Umbilical hernia     Past Surgical History:  Procedure Laterality Date   CHOLECYSTECTOMY     CHOLECYSTECTOMY, LAPAROSCOPIC N/A    COLON SURGERY     Colectomy 2005   COLONOSCOPY N/A 01/11/2019   Procedure: COLONOSCOPY;  Surgeon: Janalyn Keene NOVAK, MD;  Location: [facility] ENDOSCOPY;  Service: Endoscopy;  Laterality: N/A;   COLONOSCOPY WITH PROPOFOL  N/A 02/11/2019   Procedure: COLONOSCOPY WITH PROPOFOL ;  Surgeon: Unk Corinn Skiff, MD;  Location: [facility] ENDOSCOPY;  Service: Gastroenterology;  Laterality: N/A;  Pediatric endoscope   CYSTOSCOPY W/ RETROGRADES Bilateral 04/01/2017   Procedure: CYSTOSCOPY WITH RETROGRADE PYELOGRAM;  Surgeon: Penne Knee, MD;  Location: [facility] ORS;  Service: Urology;  Laterality: Bilateral;   CYSTOSCOPY WITH STENT PLACEMENT Bilateral 04/01/2017   Procedure: CYSTOSCOPY WITH STENT PLACEMENT;  Surgeon: Penne Knee, MD;  Location: [facility] ORS;  Service: Urology;  Laterality: Bilateral;   CYSTOSCOPY/URETEROSCOPY/HOLMIUM LASER/STENT PLACEMENT Left 10/16/2014   Procedure: CYSTOSCOPY/URETEROSCOPY/HOLMIUM LASER/STENT PLACEMENT;  Surgeon: Knee Penne, MD;  Location: [facility] ORS;  Service: Urology;  Laterality: Left;   CYSTOSCOPY/URETEROSCOPY/HOLMIUM LASER/STENT PLACEMENT Bilateral 04/20/2017   Procedure: CYSTOSCOPY/URETEROSCOPY/HOLMIUM LASER/STENT EXCHANGE;  Surgeon: Penne Knee, MD;  Location: [facility] ORS;  Service: Urology;  Laterality: Bilateral;   HERNIA REPAIR     INSERTION OF MESH  03/30/2024   Procedure: INSERTION OF MESH;  Surgeon: Lane Shope, MD;  Location: [facility] ORS;  Service: General;;   Percutaneous Left    Nephrolithotomy   XI ROBOTIC ASSISTED VENTRAL HERNIA N/A 03/30/2024   Procedure: REPAIR, HERNIA, VENTRAL, ROBOT-ASSISTED;  Surgeon: Lane Shope, MD;  Location: [facility] ORS;  Service: General;  Laterality: N/A;    Social History   Socioeconomic History   Marital status: Married    Spouse name: Maggart,Carolyn M (Spouse)   Number of children: Not on file   Years of education: Not on file   Highest education level: Not on file  Occupational History   Not on file  Tobacco Use   Smoking status: Never    Passive exposure: Never   Smokeless tobacco: Never  Vaping Use   Vaping status: Never Used  Substance and Sexual Activity   Alcohol use: No   Drug use: No   Sexual activity: Not Currently  Other Topics Concern   Not  on file  Social History Narrative   Not on file   Social Drivers of Health   Financial Resource Strain: Not on file  Food Insecurity: Not on file  Transportation Needs: Not on file  Physical Activity: Not on file  Stress: Not on file  Social Connections: Not on file  Intimate Partner Violence: Not on file    Family History  Problem Relation Age of Onset   Parkinson's disease Mother    Heart disease Father    Lymphoma Brother    Lung cancer Brother     Heart disease Brother     No Known Allergies  Outpatient Medications Prior to Visit  Medication Sig   allopurinol  (ZYLOPRIM ) 300 MG tablet Take 1 tablet (300 mg total) by mouth daily.   amLODipine  (NORVASC ) 2.5 MG tablet Take 1 tablet (2.5 mg total) by mouth daily.   celecoxib  (CELEBREX ) 200 MG capsule Take 1 capsule (200 mg total) by mouth 2 (two) times daily.   finasteride  (PROSCAR ) 5 MG tablet Take 1 tablet (5 mg total) by mouth daily.   gabapentin  (NEURONTIN ) 300 MG capsule Take 1 capsule (300 mg total) by mouth 2 (two) times daily.   ibuprofen (ADVIL) 800 MG tablet Take 1 tablet (800 mg total) by mouth every 8 (eight) hours as needed.   losartan  (COZAAR ) 100 MG tablet Take 1 tablet (100 mg total) by mouth daily.   metoprolol  tartrate (LOPRESSOR ) 50 MG tablet TAKE 1 TABLET BY MOUTH TWICE DAILY   MOUNJARO  15 MG/0.5ML Pen ADMINISTER 15 MG UNDER THE SKIN 1 TIME A WEEK   oxybutynin  (DITROPAN  XL) 15 MG 24 hr tablet Take 1 tablet (15 mg total) by mouth daily.   traZODone  (DESYREL ) 150 MG tablet Take 1 tablet (150 mg total) by mouth at bedtime as needed for sleep.   No facility-administered medications prior to visit.    Review of Systems  Constitutional: Negative.   HENT: Negative.  Negative for congestion and sore throat.   Eyes: Negative.   Respiratory: Negative.  Negative for shortness of breath.   Cardiovascular: Negative.  Negative for chest pain.  Gastrointestinal: Negative.  Negative for abdominal pain, constipation and diarrhea.  Genitourinary: Negative.   Musculoskeletal:  Negative for joint pain and myalgias.  Skin: Negative.   Neurological: Negative.  Negative for dizziness and headaches.  Endo/Heme/Allergies: Negative.   All other systems reviewed and are negative.      Objective:   BP 124/68   Pulse 75   Ht 5' 6 (1.676 m)   Wt 271 lb 9.6 oz (123.2 kg)   SpO2 95%   BMI 43.84 kg/m   Vitals:   05/03/24 1414  BP: 124/68  Pulse: 75  Height: 5' 6 (1.676 m)   Weight: 271 lb 9.6 oz (123.2 kg)  SpO2: 95%  BMI (Calculated): 43.86    Physical Exam Nursing note reviewed.  Constitutional:      Appearance: Normal appearance. He is normal weight.  HENT:     Head: Normocephalic and atraumatic.     Nose: Nose normal.     Mouth/Throat:     Mouth: Mucous membranes are moist.     Pharynx: Oropharynx is clear. No pharyngeal swelling, oropharyngeal exudate, posterior oropharyngeal erythema, uvula swelling or postnasal drip.   Eyes:     Extraocular Movements: Extraocular movements intact.     Conjunctiva/sclera: Conjunctivae normal.     Pupils: Pupils are equal, round, and reactive to light.  Cardiovascular:     Rate and Rhythm:  Normal rate and regular rhythm.     Pulses: Normal pulses.     Heart sounds: Normal heart sounds.  Pulmonary:     Effort: Pulmonary effort is normal.     Breath sounds: Normal breath sounds.  Abdominal:     General: Abdomen is flat. Bowel sounds are normal.     Palpations: Abdomen is soft.  Musculoskeletal:        General: Normal range of motion.     Cervical back: Normal range of motion.  Skin:    General: Skin is warm and dry.  Neurological:     General: No focal deficit present.     Mental Status: He is alert and oriented to person, place, and time.  Psychiatric:        Mood and Affect: Mood normal.        Behavior: Behavior normal.        Thought Content: Thought content normal.        Judgment: Judgment normal.      No results found for any visits on 05/03/24.  Recent Results (from the past 2160 hours)  POCT CBG (Fasting - Glucose)     Status: Normal   Collection Time: 02/19/24  1:42 PM  Result Value Ref Range   Glucose Fasting, POC 96 70 - 99 mg/dL  I-STAT creatinine     Status: None   Collection Time: 03/08/24 12:22 PM  Result Value Ref Range   Creatinine, Ser 1.10 0.61 - 1.24 mg/dL  Comprehensive metabolic panel     Status: Abnormal   Collection Time: 03/25/24 10:11 AM  Result Value Ref Range    Sodium 139 135 - 145 mmol/L   Potassium 4.5 3.5 - 5.1 mmol/L   Chloride 103 98 - 111 mmol/L   CO2 27 22 - 32 mmol/L   Glucose, Bld 89 70 - 99 mg/dL    Comment: Glucose reference range applies only to samples taken after fasting for at least 8 hours.   BUN 24 (H) 8 - 23 mg/dL   Creatinine, Ser 8.94 0.61 - 1.24 mg/dL   Calcium 9.2 8.9 - 89.6 mg/dL   Total Protein 6.5 6.5 - 8.1 g/dL   Albumin 3.9 3.5 - 5.0 g/dL   AST 13 (L) 15 - 41 U/L   ALT 21 0 - 44 U/L   Alkaline Phosphatase 21 (L) 38 - 126 U/L   Total Bilirubin 0.8 0.0 - 1.2 mg/dL   GFR, Estimated >39 >39 mL/min    Comment: (NOTE) Calculated using the CKD-EPI Creatinine Equation (2021)    Anion gap 9 5 - 15    Comment: Performed at [facility], [address] [place], [place] [address]  CBC with Differential/Platelet     Status: Abnormal   Collection Time: 03/25/24 10:11 AM  Result Value Ref Range   WBC 7.2 4.0 - 10.5 K/uL   RBC 5.09 4.22 - 5.81 MIL/uL   Hemoglobin 15.3 13.0 - 17.0 g/dL   HCT 55.3 60.9 - 47.9 %   MCV 87.6 80.0 - 100.0 fL   MCH 30.1 26.0 - 34.0 pg   MCHC 34.3 30.0 - 36.0 g/dL   RDW 86.5 88.4 - 84.4 %   Platelets 103 (L) 150 - 400 K/uL   nRBC 0.0 0.0 - 0.2 %   Neutrophils Relative % 65 %   Neutro Abs 4.6 1.7 - 7.7 K/uL   Lymphocytes Relative 17 %   Lymphs Abs 1.2 0.7 - 4.0 K/uL   Monocytes  Relative 16 %   Monocytes Absolute 1.2 (H) 0.1 - 1.0 K/uL   Eosinophils Relative 2 %   Eosinophils Absolute 0.1 0.0 - 0.5 K/uL   Basophils Relative 0 %   Basophils Absolute 0.0 0.0 - 0.1 K/uL   Immature Granulocytes 0 %   Abs Immature Granulocytes 0.03 0.00 - 0.07 K/uL    Comment: Performed at [facility], [address] [place], [place] [address]  Glucose, capillary     Status: None   Collection Time: 03/30/24  7:51 AM  Result Value Ref Range   Glucose-Capillary 83 70 - 99 mg/dL    Comment: Glucose reference range applies only to samples taken after fasting for at least 8 hours.   Glucose, capillary     Status: Abnormal   Collection Time: 03/30/24 11:22 AM  Result Value Ref Range   Glucose-Capillary 113 (H) 70 - 99 mg/dL    Comment: Glucose reference range applies only to samples taken after fasting for at least 8 hours.      Assessment & Plan:  Clotrimazole Continue current medications  Problem List Items Addressed This Visit       Cardiovascular and Mediastinum   HTN (hypertension)     Digestive   Candida infection, oral - Primary   Relevant Medications   clotrimazole (MYCELEX) 10 MG troche     Endocrine   Type 2 diabetes mellitus with hyperglycemia, without long-term current use of insulin (HCC)     Other   Obesity, Class III, BMI 40-49.9 (morbid obesity) (HCC)    Return if symptoms worsen or fail to improve, for with Alan.   Total time spent: 25 minutes. This time includes review of previous notes and results and patient face to face interaction during today's visit.    Jeoffrey Pollen, NP  05/03/2024   This document may have been prepared by Dragon Voice Recognition software and as such may include unintentional dictation errors.

## 2024-05-30 MED ORDER — FLUCONAZOLE 150 MG PO TABS: 150.0000 mg | ORAL_TABLET | Freq: Every day | ORAL | 0 refills | Status: AC

## 2024-05-30 NOTE — Addendum Note (Signed)
 Addended by: ORLEAN PALMA on: 05/30/2024 10:03 AM   Modules accepted: Orders

## 2024-06-14 ENCOUNTER — Other Ambulatory Visit: Payer: Self-pay

## 2024-06-17 ENCOUNTER — Ambulatory Visit: Payer: Medicare (Managed Care) | Admitting: Family

## 2024-06-17 ENCOUNTER — Encounter: Payer: Self-pay | Admitting: Family

## 2024-06-17 MED ORDER — NYSTATIN 100000 UNIT/ML MT SUSP: 5.0000 mL | Freq: Three times a day (TID) | OROMUCOSAL | 1 refills | Status: AC

## 2024-06-17 NOTE — Progress Notes (Unsigned)
 "  Established Patient Office Visit  Subjective:  Patient ID: Isaiah Walker, male    DOB: Nov 01, 1945  Age: 79 y.o. MRN: [id number]  Chief Complaint  Patient presents with   Acute Visit    Sore mouth    HPI  No other concerns at this time.   Past Medical History:  Diagnosis Date   Arthritis    BPH (benign prostatic hyperplasia)    Cancer (HCC)    basal cell   Crohn's disease (HCC)    Depression    Diabetes mellitus without complication (HCC)    History of kidney stones    Hypertension    Kidney stones    Partial small bowel obstruction (HCC) 09/13/2020   Umbilical hernia     Past Surgical History:  Procedure Laterality Date   CHOLECYSTECTOMY     CHOLECYSTECTOMY, LAPAROSCOPIC N/A    COLON SURGERY     Colectomy 2005   COLONOSCOPY N/A 01/11/2019   Procedure: COLONOSCOPY;  Surgeon: Janalyn Keene NOVAK, MD;  Location: [facility];  Service: Endoscopy;  Laterality: N/A;   COLONOSCOPY WITH PROPOFOL  N/A 02/11/2019   Procedure: COLONOSCOPY WITH PROPOFOL ;  Surgeon: Unk Corinn Skiff, MD;  Location: [facility];  Service: Gastroenterology;  Laterality: N/A;  Pediatric endoscope   CYSTOSCOPY W/ RETROGRADES Bilateral 04/01/2017   Procedure: CYSTOSCOPY WITH RETROGRADE PYELOGRAM;  Surgeon: Penne Knee, MD;  Location: [facility];  Service: Urology;  Laterality: Bilateral;   CYSTOSCOPY WITH STENT PLACEMENT Bilateral 04/01/2017   Procedure: CYSTOSCOPY WITH STENT PLACEMENT;  Surgeon: Penne Knee, MD;  Location: [facility];  Service: Urology;  Laterality: Bilateral;   CYSTOSCOPY/URETEROSCOPY/HOLMIUM LASER/STENT PLACEMENT Left 10/16/2014   Procedure: CYSTOSCOPY/URETEROSCOPY/HOLMIUM LASER/STENT PLACEMENT;  Surgeon: Knee Penne, MD;  Location: [facility];  Service: Urology;  Laterality: Left;   CYSTOSCOPY/URETEROSCOPY/HOLMIUM LASER/STENT PLACEMENT Bilateral 04/20/2017   Procedure: CYSTOSCOPY/URETEROSCOPY/HOLMIUM LASER/STENT EXCHANGE;  Surgeon: Penne Knee, MD;  Location: [facility];   Service: Urology;  Laterality: Bilateral;   HERNIA REPAIR     INSERTION OF MESH  03/30/2024   Procedure: INSERTION OF MESH;  Surgeon: Lane Shope, MD;  Location: [facility];  Service: General;;   Percutaneous Left    Nephrolithotomy   XI ROBOTIC ASSISTED VENTRAL HERNIA N/A 03/30/2024   Procedure: REPAIR, HERNIA, VENTRAL, ROBOT-ASSISTED;  Surgeon: Lane Shope, MD;  Location: [facility];  Service: General;  Laterality: N/A;    Social History   Socioeconomic History   Marital status: Married    Spouse name: Schor,Carolyn M (Spouse)   Number of children: Not on file   Years of education: Not on file   Highest education level: Not on file  Occupational History   Not on file  Tobacco Use   Smoking status: Never    Passive exposure: Never   Smokeless tobacco: Never  Vaping Use   Vaping status: Never Used  Substance and Sexual Activity   Alcohol use: No   Drug use: No   Sexual activity: Not Currently  Other Topics Concern   Not on file  Social History Narrative   Not on file   Social Drivers of Health   Tobacco Use: Low Risk (06/17/2024)   Patient History    Smoking Tobacco Use: Never    Smokeless Tobacco Use: Never    Passive Exposure: Never  Financial Resource Strain: Not on file  Food Insecurity: Not on file  Transportation Needs: Not on file  Physical Activity: Not on file  Stress: Not on file  Social Connections: Not on file  Intimate Partner Violence:  Not on file  Depression (PHQ2-9): Low Risk (08/18/2023)   Depression (PHQ2-9)    PHQ-2 Score: 0  Alcohol Screen: Not on file  Housing: Unknown (12/22/2023)   Received from [facility]   Epic    Unable to Pay for Housing in the Last Year: Not on file    Number of Times Moved in the Last Year: Not on file    At any time in the past 12 months, were you homeless or living in a shelter (including now)?: No  Utilities: Not on file  Health Literacy: Not on file    Family History  Problem Relation  Age of Onset   Parkinson's disease Mother    Heart disease Father    Lymphoma Brother    Lung cancer Brother    Heart disease Brother     Allergies[1]  Review of Systems  All other systems reviewed and are negative.      Objective:   BP 118/64   Pulse 87   Ht 5' 6 (1.676 m)   Wt 211 lb (95.7 kg)   SpO2 97%   BMI 34.06 kg/m   Vitals:   06/17/24 1259  BP: 118/64  Pulse: 87  Height: 5' 6 (1.676 m)  Weight: 211 lb (95.7 kg)  SpO2: 97%  BMI (Calculated): 34.07    Physical Exam Vitals and nursing note reviewed.  Constitutional:      Appearance: Normal appearance. He is normal weight.  Eyes:     Pupils: Pupils are equal, round, and reactive to light.  Cardiovascular:     Rate and Rhythm: Normal rate and regular rhythm.     Pulses: Normal pulses.     Heart sounds: Normal heart sounds.  Pulmonary:     Effort: Pulmonary effort is normal.     Breath sounds: Normal breath sounds.  Neurological:     Mental Status: He is alert.  Psychiatric:        Mood and Affect: Mood normal.        Behavior: Behavior normal.      No results found for any visits on 06/17/24.  Recent Results (from the past 2160 hours)  Comprehensive metabolic panel     Status: Abnormal   Collection Time: 03/25/24 10:11 AM  Result Value Ref Range   Sodium 139 135 - 145 mmol/L   Potassium 4.5 3.5 - 5.1 mmol/L   Chloride 103 98 - 111 mmol/L   CO2 27 22 - 32 mmol/L   Glucose, Bld 89 70 - 99 mg/dL    Comment: Glucose reference range applies only to samples taken after fasting for at least 8 hours.   BUN 24 (H) 8 - 23 mg/dL   Creatinine, Ser 8.94 0.61 - 1.24 mg/dL   Calcium 9.2 8.9 - 89.6 mg/dL   Total Protein 6.5 6.5 - 8.1 g/dL   Albumin 3.9 3.5 - 5.0 g/dL   AST 13 (L) 15 - 41 U/L   ALT 21 0 - 44 U/L   Alkaline Phosphatase 21 (L) 38 - 126 U/L   Total Bilirubin 0.8 0.0 - 1.2 mg/dL   GFR, Estimated >39 >39 mL/min    Comment: (NOTE) Calculated using the CKD-EPI Creatinine Equation  (2021)    Anion gap 9 5 - 15    Comment: Performed at [facility], [address], [place], [place] [address]  CBC with Differential/Platelet     Status: Abnormal   Collection Time: 03/25/24 10:11 AM  Result Value Ref  Range   WBC 7.2 4.0 - 10.5 K/uL   RBC 5.09 4.22 - 5.81 MIL/uL   Hemoglobin 15.3 13.0 - 17.0 g/dL   HCT 55.3 60.9 - 47.9 %   MCV 87.6 80.0 - 100.0 fL   MCH 30.1 26.0 - 34.0 pg   MCHC 34.3 30.0 - 36.0 g/dL   RDW 86.5 88.4 - 84.4 %   Platelets 103 (L) 150 - 400 K/uL   nRBC 0.0 0.0 - 0.2 %   Neutrophils Relative % 65 %   Neutro Abs 4.6 1.7 - 7.7 K/uL   Lymphocytes Relative 17 %   Lymphs Abs 1.2 0.7 - 4.0 K/uL   Monocytes Relative 16 %   Monocytes Absolute 1.2 (H) 0.1 - 1.0 K/uL   Eosinophils Relative 2 %   Eosinophils Absolute 0.1 0.0 - 0.5 K/uL   Basophils Relative 0 %   Basophils Absolute 0.0 0.0 - 0.1 K/uL   Immature Granulocytes 0 %   Abs Immature Granulocytes 0.03 0.00 - 0.07 K/uL    Comment: Performed at [facility], [address] [place], [place] [address]  Glucose, capillary     Status: None   Collection Time: 03/30/24  7:51 AM  Result Value Ref Range   Glucose-Capillary 83 70 - 99 mg/dL    Comment: Glucose reference range applies only to samples taken after fasting for at least 8 hours.  Glucose, capillary     Status: Abnormal   Collection Time: 03/30/24 11:22 AM  Result Value Ref Range   Glucose-Capillary 113 (H) 70 - 99 mg/dL    Comment: Glucose reference range applies only to samples taken after fasting for at least 8 hours.       Assessment & Plan:   Assessment & Plan     No follow-ups on file.   Total time spent: {AMA time spent:29001} minutes  ALAN CHRISTELLA ARRANT, FNP  06/17/2024   This document may have been prepared by [facility] Voice Recognition software and as such may include unintentional dictation errors.     [1] No Known Allergies  "

## 2024-07-22 ENCOUNTER — Ambulatory Visit: Payer: Medicare (Managed Care) | Admitting: Cardiology

## 2024-07-22 ENCOUNTER — Encounter: Payer: Self-pay | Admitting: Cardiology

## 2024-07-22 MED ORDER — CEPHALEXIN 500 MG PO CAPS: 500.0000 mg | ORAL_CAPSULE | Freq: Two times a day (BID) | ORAL | 0 refills | Status: AC

## 2024-07-22 NOTE — Progress Notes (Unsigned)
 "  Established Patient Office Visit  Subjective:  Patient ID: Isaiah Walker, male    DOB: 15-Jul-1945  Age: 79 y.o. MRN: [id number]  Chief Complaint  Patient presents with   Acute Visit    Open sore right up under his tail bone to the side that wont heal  Discharge has some odor but no color     Patient in office for an acute visit. Been there a couple weeks, putting desitin on it with no change.     No other concerns at this time.   Past Medical History:  Diagnosis Date   Arthritis    BPH (benign prostatic hyperplasia)    Cancer (HCC)    basal cell   Crohn's disease (HCC)    Depression    Diabetes mellitus without complication (HCC)    History of kidney stones    Hypertension    Kidney stones    Partial small bowel obstruction (HCC) 09/13/2020   Umbilical hernia     Past Surgical History:  Procedure Laterality Date   CHOLECYSTECTOMY     CHOLECYSTECTOMY, LAPAROSCOPIC N/A    COLON SURGERY     Colectomy 2005   COLONOSCOPY N/A 01/11/2019   Procedure: COLONOSCOPY;  Surgeon: Janalyn Keene NOVAK, MD;  Location: [facility];  Service: Endoscopy;  Laterality: N/A;   COLONOSCOPY WITH PROPOFOL  N/A 02/11/2019   Procedure: COLONOSCOPY WITH PROPOFOL ;  Surgeon: Unk Corinn Skiff, MD;  Location: [facility] ENDOSCOPY;  Service: Gastroenterology;  Laterality: N/A;  Pediatric endoscope   CYSTOSCOPY W/ RETROGRADES Bilateral 04/01/2017   Procedure: CYSTOSCOPY WITH RETROGRADE PYELOGRAM;  Surgeon: Penne Knee, MD;  Location: [facility];  Service: Urology;  Laterality: Bilateral;   CYSTOSCOPY WITH STENT PLACEMENT Bilateral 04/01/2017   Procedure: CYSTOSCOPY WITH STENT PLACEMENT;  Surgeon: Penne Knee, MD;  Location: [facility];  Service: Urology;  Laterality: Bilateral;   CYSTOSCOPY/URETEROSCOPY/HOLMIUM LASER/STENT PLACEMENT Left 10/16/2014   Procedure: CYSTOSCOPY/URETEROSCOPY/HOLMIUM LASER/STENT PLACEMENT;  Surgeon: Knee Penne, MD;  Location: [facility];  Service: Urology;  Laterality:  Left;   CYSTOSCOPY/URETEROSCOPY/HOLMIUM LASER/STENT PLACEMENT Bilateral 04/20/2017   Procedure: CYSTOSCOPY/URETEROSCOPY/HOLMIUM LASER/STENT EXCHANGE;  Surgeon: Penne Knee, MD;  Location: [facility];  Service: Urology;  Laterality: Bilateral;   HERNIA REPAIR     INSERTION OF MESH  03/30/2024   Procedure: INSERTION OF MESH;  Surgeon: Lane Shope, MD;  Location: [facility];  Service: General;;   Percutaneous Left    Nephrolithotomy   XI ROBOTIC ASSISTED VENTRAL HERNIA N/A 03/30/2024   Procedure: REPAIR, HERNIA, VENTRAL, ROBOT-ASSISTED;  Surgeon: Lane Shope, MD;  Location: [facility];  Service: General;  Laterality: N/A;    Social History   Socioeconomic History   Marital status: Married    Spouse name: Montini,Carolyn M (Spouse)   Number of children: Not on file   Years of education: Not on file   Highest education level: Not on file  Occupational History   Not on file  Tobacco Use   Smoking status: Never    Passive exposure: Never   Smokeless tobacco: Never  Vaping Use   Vaping status: Never Used  Substance and Sexual Activity   Alcohol use: No   Drug use: No   Sexual activity: Not Currently  Other Topics Concern   Not on file  Social History Narrative   Not on file   Social Drivers of Health   Tobacco Use: Low Risk (07/22/2024)   Patient History    Smoking Tobacco Use: Never    Smokeless Tobacco Use: Never    Passive  Exposure: Never  Programmer, Applications: Not on file  Food Insecurity: Not on file  Transportation Needs: Not on file  Physical Activity: Not on file  Stress: Not on file  Social Connections: Not on file  Intimate Partner Violence: Not on file  Depression (PHQ2-9): Low Risk (08/18/2023)   Depression (PHQ2-9)    PHQ-2 Score: 0  Alcohol Screen: Not on file  Housing: Unknown (12/22/2023)   Received from [facility]   Epic    Unable to Pay for Housing in the Last Year: Not on file    Number of Times Moved in the Last Year: Not  on file    At any time in the past 12 months, were you homeless or living in a shelter (including now)?: No  Utilities: Not on file  Health Literacy: Not on file    Family History  Problem Relation Age of Onset   Parkinson's disease Mother    Heart disease Father    Lymphoma Brother    Lung cancer Brother    Heart disease Brother     Allergies[1]  Show/hide medication list[2]  Review of Systems  Constitutional: Negative.   HENT: Negative.    Eyes: Negative.   Respiratory: Negative.  Negative for shortness of breath.   Cardiovascular: Negative.  Negative for chest pain.  Gastrointestinal: Negative.  Negative for abdominal pain, constipation and diarrhea.  Genitourinary: Negative.   Musculoskeletal:  Negative for joint pain and myalgias.  Skin: Negative.   Neurological: Negative.  Negative for dizziness and headaches.  Endo/Heme/Allergies: Negative.   All other systems reviewed and are negative.      Objective:   BP 118/76   Pulse 67   Ht 5' 6 (1.676 m)   Wt 202 lb (91.6 kg)   SpO2 98%   BMI 32.60 kg/m   Vitals:   07/22/24 1259  BP: 118/76  Pulse: 67  Height: 5' 6 (1.676 m)  Weight: 202 lb (91.6 kg)  SpO2: 98%  BMI (Calculated): 32.62    Physical Exam Nursing note reviewed.  Constitutional:      Appearance: Normal appearance. He is normal weight.  HENT:     Head: Normocephalic and atraumatic.     Nose: Nose normal.     Mouth/Throat:     Mouth: Mucous membranes are moist.     Pharynx: Oropharynx is clear.  Eyes:     Extraocular Movements: Extraocular movements intact.     Conjunctiva/sclera: Conjunctivae normal.     Pupils: Pupils are equal, round, and reactive to light.  Cardiovascular:     Rate and Rhythm: Normal rate and regular rhythm.     Pulses: Normal pulses.     Heart sounds: Normal heart sounds.  Pulmonary:     Effort: Pulmonary effort is normal.     Breath sounds: Normal breath sounds.  Abdominal:     General: Abdomen is flat.  Bowel sounds are normal.     Palpations: Abdomen is soft.  Musculoskeletal:        General: Normal range of motion.     Cervical back: Normal range of motion.  Skin:    General: Skin is warm and dry.  Neurological:     General: No focal deficit present.     Mental Status: He is alert and oriented to person, place, and time.  Psychiatric:        Mood and Affect: Mood normal.        Behavior: Behavior normal.  Thought Content: Thought content normal.        Judgment: Judgment normal.      No results found for any visits on 07/22/24.  No results found for this or any previous visit (from the past 2160 hours).    Assessment & Plan:   Problem List Items Addressed This Visit   None   Return if symptoms worsen or fail to improve, for as scheduled with Alan.   Total time spent: {AMA time spent:29001} minutes. This time includes review of previous notes and results and patient face to face interaction during today's visit.    Jeoffrey Pollen, NP  07/22/2024   This document may have been prepared by [facility] Voice Recognition software and as such may include unintentional dictation errors.     [1] No Known Allergies [2]  Outpatient Medications Prior to Visit  Medication Sig   allopurinol  (ZYLOPRIM ) 300 MG tablet Take 1 tablet (300 mg total) by mouth daily.   amLODipine  (NORVASC ) 2.5 MG tablet Take 1 tablet (2.5 mg total) by mouth daily.   celecoxib  (CELEBREX ) 200 MG capsule Take 1 capsule (200 mg total) by mouth 2 (two) times daily.   finasteride  (PROSCAR ) 5 MG tablet Take 1 tablet (5 mg total) by mouth daily.   gabapentin  (NEURONTIN ) 300 MG capsule Take 1 capsule (300 mg total) by mouth 2 (two) times daily.   ibuprofen  (ADVIL ) 800 MG tablet Take 1 tablet (800 mg total) by mouth every 8 (eight) hours as needed.   losartan  (COZAAR ) 100 MG tablet Take 1 tablet (100 mg total) by mouth daily.   metoprolol  tartrate (LOPRESSOR ) 50 MG tablet TAKE 1 TABLET BY MOUTH TWICE  DAILY   MOUNJARO  15 MG/0.5ML Pen ADMINISTER 15 MG UNDER THE SKIN 1 TIME A WEEK   oxybutynin  (DITROPAN  XL) 15 MG 24 hr tablet Take 1 tablet (15 mg total) by mouth daily.   traZODone  (DESYREL ) 150 MG tablet Take 1 tablet (150 mg total) by mouth at bedtime as needed for sleep.   fluconazole  (DIFLUCAN ) 150 MG tablet Take 1 tablet (150 mg total) by mouth daily. (Patient not taking: Reported on 06/17/2024)   No facility-administered medications prior to visit.   "

## 2024-08-09 ENCOUNTER — Ambulatory Visit: Admitting: Family

## 2024-08-19 ENCOUNTER — Ambulatory Visit: Admitting: Family

## 2024-09-14 ENCOUNTER — Ambulatory Visit: Admitting: Urology

## 2024-09-15 ENCOUNTER — Ambulatory Visit: Admitting: Urology
